# Patient Record
Sex: Male | Born: 1965 | Race: Black or African American | Hispanic: No | Smoking: Current every day smoker
Health system: Southern US, Community
[De-identification: ages and names within clinical notes are randomized; demographics above are authoritative.]

## PROBLEM LIST (undated history)

## (undated) ENCOUNTER — Emergency Department (HOSPITAL_COMMUNITY): Admission: EM | Payer: Self-pay | Source: Home / Self Care

## (undated) DIAGNOSIS — F4310 Post-traumatic stress disorder, unspecified: Secondary | ICD-10-CM

## (undated) DIAGNOSIS — I10 Essential (primary) hypertension: Secondary | ICD-10-CM

## (undated) DIAGNOSIS — K649 Unspecified hemorrhoids: Secondary | ICD-10-CM

## (undated) DIAGNOSIS — A64 Unspecified sexually transmitted disease: Secondary | ICD-10-CM

## (undated) DIAGNOSIS — K219 Gastro-esophageal reflux disease without esophagitis: Secondary | ICD-10-CM

---

## 2000-09-15 ENCOUNTER — Emergency Department (HOSPITAL_COMMUNITY): Admission: EM | Admit: 2000-09-15 | Discharge: 2000-09-15 | Payer: Self-pay | Admitting: Emergency Medicine

## 2000-11-17 ENCOUNTER — Emergency Department (HOSPITAL_COMMUNITY): Admission: EM | Admit: 2000-11-17 | Discharge: 2000-11-17 | Payer: Self-pay | Admitting: Emergency Medicine

## 2000-12-23 ENCOUNTER — Encounter: Payer: Self-pay | Admitting: Emergency Medicine

## 2000-12-23 ENCOUNTER — Emergency Department (HOSPITAL_COMMUNITY): Admission: EM | Admit: 2000-12-23 | Discharge: 2000-12-23 | Payer: Self-pay | Admitting: Emergency Medicine

## 2002-03-02 ENCOUNTER — Emergency Department (HOSPITAL_COMMUNITY): Admission: EM | Admit: 2002-03-02 | Discharge: 2002-03-02 | Payer: Self-pay | Admitting: *Deleted

## 2002-08-06 ENCOUNTER — Emergency Department (HOSPITAL_COMMUNITY): Admission: EM | Admit: 2002-08-06 | Discharge: 2002-08-06 | Payer: Self-pay | Admitting: Emergency Medicine

## 2003-07-01 ENCOUNTER — Emergency Department (HOSPITAL_COMMUNITY): Admission: EM | Admit: 2003-07-01 | Discharge: 2003-07-01 | Payer: Self-pay | Admitting: Emergency Medicine

## 2003-09-23 ENCOUNTER — Emergency Department (HOSPITAL_COMMUNITY): Admission: EM | Admit: 2003-09-23 | Discharge: 2003-09-23 | Payer: Self-pay | Admitting: Emergency Medicine

## 2003-10-01 ENCOUNTER — Emergency Department (HOSPITAL_COMMUNITY): Admission: EM | Admit: 2003-10-01 | Discharge: 2003-10-01 | Payer: Self-pay | Admitting: Emergency Medicine

## 2003-11-21 ENCOUNTER — Emergency Department (HOSPITAL_COMMUNITY): Admission: EM | Admit: 2003-11-21 | Discharge: 2003-11-21 | Payer: Self-pay | Admitting: Emergency Medicine

## 2004-01-12 ENCOUNTER — Emergency Department (HOSPITAL_COMMUNITY): Admission: EM | Admit: 2004-01-12 | Discharge: 2004-01-12 | Payer: Self-pay | Admitting: Emergency Medicine

## 2004-06-03 ENCOUNTER — Emergency Department (HOSPITAL_COMMUNITY): Admission: EM | Admit: 2004-06-03 | Discharge: 2004-06-03 | Payer: Self-pay | Admitting: Emergency Medicine

## 2004-06-14 ENCOUNTER — Emergency Department (HOSPITAL_COMMUNITY): Admission: EM | Admit: 2004-06-14 | Discharge: 2004-06-14 | Payer: Self-pay | Admitting: Emergency Medicine

## 2006-05-02 ENCOUNTER — Emergency Department (HOSPITAL_COMMUNITY): Admission: EM | Admit: 2006-05-02 | Discharge: 2006-05-02 | Payer: Self-pay | Admitting: Emergency Medicine

## 2006-05-16 ENCOUNTER — Emergency Department (HOSPITAL_COMMUNITY): Admission: EM | Admit: 2006-05-16 | Discharge: 2006-05-16 | Payer: Self-pay | Admitting: Emergency Medicine

## 2006-05-31 ENCOUNTER — Emergency Department (HOSPITAL_COMMUNITY): Admission: EM | Admit: 2006-05-31 | Discharge: 2006-05-31 | Payer: Self-pay | Admitting: Emergency Medicine

## 2006-06-28 ENCOUNTER — Emergency Department (HOSPITAL_COMMUNITY): Admission: EM | Admit: 2006-06-28 | Discharge: 2006-06-28 | Payer: Self-pay | Admitting: Emergency Medicine

## 2006-07-12 ENCOUNTER — Emergency Department (HOSPITAL_COMMUNITY): Admission: EM | Admit: 2006-07-12 | Discharge: 2006-07-12 | Payer: Self-pay | Admitting: Emergency Medicine

## 2006-11-12 ENCOUNTER — Emergency Department (HOSPITAL_COMMUNITY): Admission: EM | Admit: 2006-11-12 | Discharge: 2006-11-12 | Payer: Self-pay | Admitting: Emergency Medicine

## 2006-11-26 ENCOUNTER — Emergency Department (HOSPITAL_COMMUNITY): Admission: EM | Admit: 2006-11-26 | Discharge: 2006-11-26 | Payer: Self-pay | Admitting: Emergency Medicine

## 2007-02-26 ENCOUNTER — Emergency Department (HOSPITAL_COMMUNITY): Admission: EM | Admit: 2007-02-26 | Discharge: 2007-02-26 | Payer: Self-pay | Admitting: Emergency Medicine

## 2007-04-11 ENCOUNTER — Emergency Department (HOSPITAL_COMMUNITY): Admission: EM | Admit: 2007-04-11 | Discharge: 2007-04-11 | Payer: Self-pay | Admitting: Emergency Medicine

## 2008-03-25 ENCOUNTER — Emergency Department (HOSPITAL_COMMUNITY): Admission: EM | Admit: 2008-03-25 | Discharge: 2008-03-26 | Payer: Self-pay | Admitting: Emergency Medicine

## 2009-03-22 ENCOUNTER — Emergency Department (HOSPITAL_COMMUNITY): Admission: EM | Admit: 2009-03-22 | Discharge: 2009-03-22 | Payer: Self-pay | Admitting: Emergency Medicine

## 2009-05-29 ENCOUNTER — Emergency Department (HOSPITAL_COMMUNITY): Admission: EM | Admit: 2009-05-29 | Discharge: 2009-05-29 | Payer: Self-pay | Admitting: Emergency Medicine

## 2009-07-19 ENCOUNTER — Emergency Department (HOSPITAL_COMMUNITY): Admission: EM | Admit: 2009-07-19 | Discharge: 2009-07-19 | Payer: Self-pay | Admitting: Emergency Medicine

## 2009-12-12 ENCOUNTER — Emergency Department (HOSPITAL_COMMUNITY): Admission: EM | Admit: 2009-12-12 | Discharge: 2009-12-12 | Payer: Self-pay | Admitting: Emergency Medicine

## 2010-03-06 ENCOUNTER — Emergency Department (HOSPITAL_COMMUNITY): Admission: EM | Admit: 2010-03-06 | Discharge: 2010-03-06 | Payer: Self-pay | Admitting: Emergency Medicine

## 2010-04-12 ENCOUNTER — Emergency Department (HOSPITAL_COMMUNITY): Admission: EM | Admit: 2010-04-12 | Discharge: 2010-04-12 | Payer: Self-pay | Admitting: Emergency Medicine

## 2010-07-17 ENCOUNTER — Emergency Department (HOSPITAL_COMMUNITY)
Admission: EM | Admit: 2010-07-17 | Discharge: 2010-07-17 | Disposition: A | Discharge status: Home / Self Care | Payer: Self-pay | Attending: Emergency Medicine | Admitting: Emergency Medicine

## 2010-07-17 DIAGNOSIS — K219 Gastro-esophageal reflux disease without esophagitis: Secondary | ICD-10-CM | POA: Insufficient documentation

## 2010-07-17 DIAGNOSIS — R51 Headache: Secondary | ICD-10-CM | POA: Insufficient documentation

## 2010-07-17 DIAGNOSIS — J3489 Other specified disorders of nose and nasal sinuses: Secondary | ICD-10-CM | POA: Insufficient documentation

## 2010-07-17 DIAGNOSIS — J329 Chronic sinusitis, unspecified: Secondary | ICD-10-CM | POA: Insufficient documentation

## 2010-08-11 ENCOUNTER — Emergency Department (HOSPITAL_COMMUNITY)
Admission: EM | Admit: 2010-08-11 | Discharge: 2010-08-11 | Disposition: A | Discharge status: Home / Self Care | Payer: Self-pay | Attending: Emergency Medicine | Admitting: Emergency Medicine

## 2010-08-11 DIAGNOSIS — J019 Acute sinusitis, unspecified: Secondary | ICD-10-CM | POA: Insufficient documentation

## 2010-09-02 LAB — RAPID STREP SCREEN (MED CTR MEBANE ONLY): Streptococcus, Group A Screen (Direct): POSITIVE — AB

## 2011-02-07 ENCOUNTER — Emergency Department (HOSPITAL_COMMUNITY)
Admission: EM | Admit: 2011-02-07 | Discharge: 2011-02-07 | Disposition: A | Discharge status: Home / Self Care | Payer: Self-pay | Attending: Emergency Medicine | Admitting: Emergency Medicine

## 2011-02-07 ENCOUNTER — Encounter: Payer: Self-pay | Admitting: *Deleted

## 2011-02-07 DIAGNOSIS — N342 Other urethritis: Secondary | ICD-10-CM

## 2011-02-07 DIAGNOSIS — N419 Inflammatory disease of prostate, unspecified: Secondary | ICD-10-CM | POA: Insufficient documentation

## 2011-02-07 LAB — URINALYSIS, ROUTINE W REFLEX MICROSCOPIC
Bilirubin Urine: NEGATIVE
Glucose, UA: NEGATIVE mg/dL
Hgb urine dipstick: NEGATIVE
Ketones, ur: NEGATIVE mg/dL
Nitrite: NEGATIVE

## 2011-02-07 MED ORDER — CIPROFLOXACIN HCL 500 MG PO TABS: 500.0000 mg | ORAL_TABLET | Freq: Two times a day (BID) | ORAL | Status: AC

## 2011-02-07 MED ORDER — CEFTRIAXONE SODIUM 250 MG IJ SOLR
500.0000 mg | Freq: Once | INTRAMUSCULAR | Status: AC
  Administered 2011-02-07: 500 mg via INTRAMUSCULAR
  Filled 2011-02-07: qty 500

## 2011-02-07 MED ORDER — AZITHROMYCIN 250 MG PO TABS
1000.0000 mg | ORAL_TABLET | Freq: Once | ORAL | Status: AC
  Administered 2011-02-07: 1000 mg via ORAL
  Filled 2011-02-07: qty 4

## 2011-02-07 NOTE — ED Provider Notes (Signed)
History     CSN: [id number] Arrival date & time: 02/07/2011  3:26 PM  Chief Complaint  Patient presents with  . painful urination    HPI Comments: patient c/o pain and burning with urination for one week.  States that he has also noticed a clear to milky discharge from his penis intermittently.  He also c/o some low back pain as well.  He denies recent, unprotected sex.  He also denies fever, vomiting or abd pain  Patient is a 45 y.o. male presenting with dysuria. The history is provided by the patient.  Dysuria  This is a new problem. The current episode started more than 2 days ago. The problem occurs every urination. The problem has not changed since onset.The quality of the pain is described as burning. The pain is mild. There has been no fever. He is sexually active. There is no history of pyelonephritis. Associated symptoms include discharge. Pertinent negatives include no chills, no sweats, no nausea, no vomiting, no frequency, no hematuria, no urgency and no flank pain. He has tried nothing for the symptoms. His past medical history does not include kidney stones, urological procedure, recurrent UTIs or catheterization.    History reviewed. No pertinent past medical history.  History reviewed. No pertinent past surgical history.  No family history on file.  History  Substance Use Topics  . Smoking status: Never Smoker   . Smokeless tobacco: Not on file  . Alcohol Use: No      Review of Systems  Constitutional: Negative for fever, chills, activity change and appetite change.  Gastrointestinal: Negative for nausea, vomiting, abdominal pain, constipation and blood in stool.  Genitourinary: Positive for dysuria and discharge. Negative for urgency, frequency, hematuria, flank pain, decreased urine volume, penile swelling, scrotal swelling, genital sores and testicular pain.  Musculoskeletal: Positive for back pain. Negative for myalgias, joint swelling, arthralgias and gait  problem.  Skin: Negative.   Hematological: Negative for adenopathy. Does not bruise/bleed easily.  All other systems reviewed and are negative.    Physical Exam  BP 163/100  Pulse 81  Temp(Src) 97.8 F (36.6 C) (Oral)  Resp 16  Ht 5\' 7"  (1.702 m)  Wt 215 lb (97.523 kg)  BMI 33.67 kg/m2  SpO2 100%  Physical Exam  Nursing note and vitals reviewed. Constitutional: He appears well-developed and well-nourished. No distress.  HENT:  Head: Normocephalic and atraumatic.  Cardiovascular: Normal rate, regular rhythm and normal heart sounds.   Abdominal: Soft. He exhibits no distension and no mass. There is no tenderness. There is no rebound and no guarding.  Genitourinary: Testes normal. Rectal exam shows no external hemorrhoid, no mass and anal tone normal. Prostate is tender. Circumcised. No paraphimosis, penile erythema or penile tenderness. Discharge found.       Mildly tender, boggy prostate  Musculoskeletal: Normal range of motion. He exhibits no tenderness.  Neurological: No cranial nerve deficit. He exhibits normal muscle tone.  Skin: Skin is warm and dry.  Psychiatric: He has a normal mood and affect.    ED Course  Procedures  MDM   GC and Chlamydia culture pending.  Pt has a slightly tender, boggy prostate on exam.  I have treated him with rocephin and zithromycin and I will also start him on cipro to cover non-gonococcal prostatitis given his age.  Medical screening examination/treatment/procedure(s) were performed by non-physician practitioner and as supervising physician I was immediately available for consultation/collaboration. Osvaldo Human, M.D.    Tammy L. Triplett, [place] 02/08/11 1747  Carleene Cooper III, MD 02/09/11 1131

## 2011-02-07 NOTE — ED Notes (Signed)
Painful urination x 1 wk.  Denies n/v/d.  C/o lower abd pain and lower back pain.

## 2011-02-09 LAB — GC/CHLAMYDIA PROBE AMP, GENITAL
Chlamydia, DNA Probe: POSITIVE — AB
GC Probe Amp, Genital: NEGATIVE

## 2011-02-14 NOTE — ED Notes (Signed)
Pt returned call wanting lab results. Advised of results. Treated per protocol. Advised to notify partner(s)

## 2011-03-08 LAB — URINALYSIS, ROUTINE W REFLEX MICROSCOPIC
Bilirubin Urine: NEGATIVE
Protein, ur: NEGATIVE

## 2011-03-08 LAB — GC/CHLAMYDIA PROBE AMP, GENITAL: Chlamydia, DNA Probe: NEGATIVE

## 2011-03-08 LAB — URINE CULTURE: Culture: NO GROWTH

## 2011-03-10 LAB — GC/CHLAMYDIA PROBE AMP, GENITAL
Chlamydia, DNA Probe: NEGATIVE
GC Probe Amp, Genital: POSITIVE — AB

## 2011-03-16 LAB — URINALYSIS, ROUTINE W REFLEX MICROSCOPIC
Bilirubin Urine: NEGATIVE
Glucose, UA: NEGATIVE
Hgb urine dipstick: NEGATIVE
Ketones, ur: NEGATIVE
Ketones, ur: NEGATIVE
Protein, ur: NEGATIVE
Protein, ur: NEGATIVE
Urobilinogen, UA: 0.2
Urobilinogen, UA: 1
pH: 6

## 2011-03-16 LAB — GC/CHLAMYDIA PROBE AMP, GENITAL
Chlamydia, DNA Probe: NEGATIVE
GC Probe Amp, Genital: NEGATIVE

## 2011-04-18 ENCOUNTER — Encounter (HOSPITAL_COMMUNITY): Payer: Self-pay

## 2011-04-18 ENCOUNTER — Emergency Department (HOSPITAL_COMMUNITY)
Admission: EM | Admit: 2011-04-18 | Discharge: 2011-04-18 | Disposition: A | Discharge status: Home / Self Care | Payer: Self-pay | Attending: Emergency Medicine | Admitting: Emergency Medicine

## 2011-04-18 DIAGNOSIS — J029 Acute pharyngitis, unspecified: Secondary | ICD-10-CM | POA: Insufficient documentation

## 2011-04-18 DIAGNOSIS — J019 Acute sinusitis, unspecified: Secondary | ICD-10-CM | POA: Insufficient documentation

## 2011-04-18 LAB — RAPID STREP SCREEN (MED CTR MEBANE ONLY): Streptococcus, Group A Screen (Direct): NEGATIVE

## 2011-04-18 MED ORDER — AMOXICILLIN-POT CLAVULANATE 875-125 MG PO TABS
1.0000 | ORAL_TABLET | Freq: Once | ORAL | Status: AC
  Administered 2011-04-18: 1 via ORAL
  Filled 2011-04-18: qty 1

## 2011-04-18 MED ORDER — TRIAMCINOLONE ACETONIDE(NASAL) 55 MCG/ACT NA INHA: 2.0000 | Freq: Every day | NASAL | Status: DC

## 2011-04-18 MED ORDER — AMOXICILLIN-POT CLAVULANATE 875-125 MG PO TABS: 1.0000 | ORAL_TABLET | Freq: Two times a day (BID) | ORAL | Status: AC

## 2011-04-18 NOTE — ED Notes (Signed)
Pt presents with sore throat x 1 week. Pt states "i think I have strep throat".

## 2011-04-18 NOTE — ED Notes (Signed)
C/o sore throat x a week per pt

## 2011-04-18 NOTE — ED Provider Notes (Signed)
History     CSN: [id number] Arrival date & time: 04/18/2011  9:05 PM   First MD Initiated Contact with Patient 04/18/11 2118      Chief Complaint  Patient presents with  . Sore Throat    (Consider location/radiation/quality/duration/timing/severity/associated sxs/prior treatment) Patient is a 45 y.o. male presenting with pharyngitis. The history is provided by the patient. No language interpreter was used.  Sore Throat This is a new problem. Episode onset: 2 days ago. The problem occurs constantly. The problem has been unchanged. Associated symptoms include congestion and a sore throat. Associated symptoms comments: Sinus pain. The symptoms are aggravated by bending. He has tried nothing for the symptoms.    History reviewed. No pertinent past medical history.  History reviewed. No pertinent past surgical history.  No family history on file.  History  Substance Use Topics  . Smoking status: Never Smoker   . Smokeless tobacco: Not on file  . Alcohol Use: No      Review of Systems  HENT: Positive for congestion, sore throat and sinus pressure.   All other systems reviewed and are negative.    Allergies  Bee venom  Home Medications   Current Outpatient Rx  Name Route Sig Dispense Refill  . ONE-DAILY MULTI VITAMINS PO TABS Oral Take 1 tablet by mouth daily.      Marland Kitchen PHENYLEPHRINE-DM-GG 2.5-5-100 MG/5ML PO LIQD Oral Take 2 tablets by mouth as needed. For congestion       BP 151/91  Pulse 99  Temp(Src) 97.3 F (36.3 C) (Oral)  Resp 20  Ht 5\' 7"  (1.702 m)  Wt 205 lb (92.987 kg)  BMI 32.11 kg/m2  SpO2 97%  Physical Exam  Nursing note and vitals reviewed. Constitutional: He is oriented to person, place, and time. He appears well-developed and well-nourished. No distress.  HENT:  Head: Normocephalic and atraumatic.    Mouth/Throat: Mucous membranes are normal. No uvula swelling. No oropharyngeal exudate, posterior oropharyngeal edema, posterior oropharyngeal  erythema or tonsillar abscesses.    Eyes: EOM are normal.  Neck: Normal range of motion.  Cardiovascular: Normal rate, regular rhythm and intact distal pulses.   Pulmonary/Chest: Effort normal and breath sounds normal. No respiratory distress.  Abdominal: Soft. He exhibits no distension. There is no tenderness.  Musculoskeletal: Normal range of motion.  Neurological: He is alert and oriented to person, place, and time.  Skin: Skin is warm and dry. He is not diaphoretic.  Psychiatric: He has a normal mood and affect. Judgment normal.    ED Course  Procedures (including critical care time)   Labs Reviewed  RAPID STREP SCREEN   No results found.   No diagnosis found.    MDM          Worthy Rancher, PA 04/18/11 2223

## 2011-04-18 NOTE — ED Provider Notes (Signed)
Medical screening examination/treatment/procedure(s) were performed by non-physician practitioner and as supervising physician I was immediately available for consultation/collaboration.   Geoffery Lyons, MD 04/18/11 2252

## 2011-05-30 ENCOUNTER — Emergency Department (HOSPITAL_COMMUNITY)
Admission: EM | Admit: 2011-05-30 | Discharge: 2011-05-30 | Disposition: A | Discharge status: Home / Self Care | Payer: Self-pay | Attending: Emergency Medicine | Admitting: Emergency Medicine

## 2011-05-30 ENCOUNTER — Emergency Department (HOSPITAL_COMMUNITY): Payer: Self-pay

## 2011-05-30 ENCOUNTER — Encounter (HOSPITAL_COMMUNITY): Payer: Self-pay

## 2011-05-30 DIAGNOSIS — IMO0001 Reserved for inherently not codable concepts without codable children: Secondary | ICD-10-CM | POA: Insufficient documentation

## 2011-05-30 DIAGNOSIS — R059 Cough, unspecified: Secondary | ICD-10-CM | POA: Insufficient documentation

## 2011-05-30 DIAGNOSIS — B9789 Other viral agents as the cause of diseases classified elsewhere: Secondary | ICD-10-CM | POA: Insufficient documentation

## 2011-05-30 DIAGNOSIS — R05 Cough: Secondary | ICD-10-CM | POA: Insufficient documentation

## 2011-05-30 DIAGNOSIS — J3489 Other specified disorders of nose and nasal sinuses: Secondary | ICD-10-CM | POA: Insufficient documentation

## 2011-05-30 DIAGNOSIS — R509 Fever, unspecified: Secondary | ICD-10-CM | POA: Insufficient documentation

## 2011-05-30 DIAGNOSIS — B349 Viral infection, unspecified: Secondary | ICD-10-CM

## 2011-05-30 MED ORDER — HYDROCOD POLST-CHLORPHEN POLST 10-8 MG/5ML PO LQCR
5.0000 mL | Freq: Once | ORAL | Status: AC
  Administered 2011-05-30: 5 mL via ORAL
  Filled 2011-05-30: qty 5

## 2011-05-30 MED ORDER — HYDROCOD POLST-CHLORPHEN POLST 10-8 MG/5ML PO LQCR: 5.0000 mL | Freq: Two times a day (BID) | ORAL | Status: DC | PRN

## 2011-05-30 NOTE — ED Provider Notes (Signed)
Scribed for Nicholes Stairs, MD, the patient was seen in room [facility] . This chart was scribed by Ellie Lunch.   CSN: [id number]  Arrival date & time 05/30/11  0916   First MD Initiated Contact with Patient 05/30/11 [phone]      Chief Complaint  Patient presents with  . URI    (Consider location/radiation/quality/duration/timing/severity/associated sxs/prior treatment) HPI Philip Gonzalez is a 45 y.o. male who presents to the Emergency Department complaining of 1 week of sudden onset productive cough with assocaited congestion, myalgias, and fever. Pt has treated with mucinex with mild improvement. No h/o diabetes, high blood pressure, or DM. Pt is a non smoker.   History reviewed. No pertinent past medical history.  History reviewed. No pertinent past surgical history.  No family history on file.  History  Substance Use Topics  . Smoking status: Never Smoker   . Smokeless tobacco: Not on file  . Alcohol Use: No      Review of Systems 10 Systems reviewed and are negative for acute change except as noted in the HPI.   Allergies  Bee venom  Home Medications   Current Outpatient Rx  Name Route Sig Dispense Refill  . ONE-DAILY MULTI VITAMINS PO TABS Oral Take 1 tablet by mouth daily.      Marland Kitchen PHENYLEPHRINE-DM-GG 2.5-5-100 MG/5ML PO LIQD Oral Take 2 tablets by mouth as needed. For congestion     . TRIAMCINOLONE ACETONIDE 55 MCG/ACT NA INHA Nasal Place 2 sprays into the nose daily. 1 Inhaler 12    BP 150/93  Pulse 96  Temp(Src) 98.2 F (36.8 C) (Oral)  Resp 20  Ht 5\' 7"  (1.702 m)  Wt 205 lb (92.987 kg)  BMI 32.11 kg/m2  SpO2 97%  Physical Exam  Nursing note and vitals reviewed. Constitutional: He is oriented to person, place, and time. He appears well-developed and well-nourished. No distress.  HENT:  Head: Normocephalic and atraumatic.  Mouth/Throat: Oropharynx is clear and moist.       Frontal and sinus tenderness  Cardiovascular: Normal rate,  regular rhythm and normal heart sounds.   Pulmonary/Chest: Effort normal and breath sounds normal. No respiratory distress.  Neurological: He is alert and oriented to person, place, and time.  Skin: Skin is warm and dry.  Psychiatric: He has a normal mood and affect.    ED Course  Procedures (including critical care time)  Dg Chest 2 View  05/30/2011  *RADIOLOGY REPORT*  Clinical Data: Productive cough  CHEST - 2 VIEW  Comparison: 05/29/2009  Findings: Heart size is normal.  Mediastinal shadows are normal. Lungs are clear.  No effusions.  No bony abnormalities.  There is prominent epicardial fat anteriorly on the lateral view, not pathologic.  IMPRESSION: Normal chest  Original Report Authenticated By: Thomasenia Sales, M.D.   ED MEDICATION Medications  chlorpheniramine-HYDROcodone (TUSSIONEX) 10-8 MG/5ML suspension 5 mL (5 mL Oral Given 05/30/11 1042)     No diagnosis found.    MDM  Viral infection No pneumonia No hypoxia or respiratory distress or toxicity  I personally performed the services described in this documentation, which was scribed in my presence. The recorded information has been reviewed and considered.        Nicholes Stairs, MD 05/30/11 1109

## 2011-05-30 NOTE — ED Notes (Signed)
Pt c/o sinus pressure, productive cough with yellow sputum, generalized body aches, and fever x 1 week.  Pt reports burning sensation in lower back and thighs.  Has been taking generic mucinex at home.

## 2012-01-10 ENCOUNTER — Encounter (HOSPITAL_COMMUNITY): Payer: Self-pay | Admitting: Emergency Medicine

## 2012-01-10 ENCOUNTER — Emergency Department (HOSPITAL_COMMUNITY)
Admission: EM | Admit: 2012-01-10 | Discharge: 2012-01-10 | Disposition: A | Discharge status: Home / Self Care | Payer: Self-pay | Attending: Emergency Medicine | Admitting: Emergency Medicine

## 2012-01-10 DIAGNOSIS — L259 Unspecified contact dermatitis, unspecified cause: Secondary | ICD-10-CM | POA: Insufficient documentation

## 2012-01-10 DIAGNOSIS — K219 Gastro-esophageal reflux disease without esophagitis: Secondary | ICD-10-CM | POA: Insufficient documentation

## 2012-01-10 DIAGNOSIS — F17200 Nicotine dependence, unspecified, uncomplicated: Secondary | ICD-10-CM | POA: Insufficient documentation

## 2012-01-10 HISTORY — DX: Gastro-esophageal reflux disease without esophagitis: K21.9

## 2012-01-10 MED ORDER — HYDROXYZINE HCL 25 MG PO TABS
50.0000 mg | ORAL_TABLET | Freq: Once | ORAL | Status: AC
  Administered 2012-01-10: 50 mg via ORAL
  Filled 2012-01-10: qty 2

## 2012-01-10 MED ORDER — PREDNISONE 10 MG PO TABS: ORAL_TABLET | ORAL | Status: DC

## 2012-01-10 MED ORDER — HYDROXYZINE HCL 25 MG PO TABS: 25.0000 mg | ORAL_TABLET | Freq: Four times a day (QID) | ORAL | Status: AC | PRN

## 2012-01-10 NOTE — ED Notes (Signed)
Patient states "I have poison oak for 1 week. Usually I can treat it at home but it is not helping this time." Complaining of generalized itching and rash "all over."

## 2012-01-10 NOTE — ED Provider Notes (Signed)
History     CSN: [id number]  Arrival date & time 01/10/12  2042   First MD Initiated Contact with Patient 01/10/12 2211      Chief Complaint  Patient presents with  . Rash    HPI Pt was seen at 2225.  Per pt, c/o gradual onset and persistence of constant "poison oak" for the past 1 week.  Describes his rash as an "itchy rash all over." Pt states he has been using OTC anti-itch cream without relief.  Denies SOB/wheezing, no open wounds, no drainage, no fevers.    Past Medical History  Diagnosis Date  . Acid reflux     History reviewed. No pertinent past surgical history.   History  Substance Use Topics  . Smoking status: Current Everyday Smoker -- 1.0 packs/day  . Smokeless tobacco: Not on file  . Alcohol Use: Yes     occ      Review of Systems ROS: Statement: All systems negative except as marked or noted in the HPI; Constitutional: Negative for fever and chills. ; ; Eyes: Negative for eye pain, redness and discharge. ; ; ENMT: Negative for ear pain, hoarseness, nasal congestion, sinus pressure and sore throat. ; ; Cardiovascular: Negative for chest pain, palpitations, diaphoresis, dyspnea and peripheral edema. ; ; Respiratory: Negative for cough, wheezing and stridor. ; ; Gastrointestinal: Negative for nausea, vomiting, diarrhea, abdominal pain, blood in stool, hematemesis, jaundice and rectal bleeding. . ; ; Genitourinary: Negative for dysuria, flank pain and hematuria. ; ; Musculoskeletal: Negative for back pain and neck pain. Negative for swelling and trauma.; ; Skin: +rash, pruritis. Negative for abrasions, blisters, bruising and skin lesion.; ; Neuro: Negative for headache, lightheadedness and neck stiffness. Negative for weakness, altered level of consciousness , altered mental status, extremity weakness, paresthesias, involuntary movement, seizure and syncope.       Allergies  Bee venom and Poison oak extract  Home Medications   Current Outpatient Rx  Name Route  Sig Dispense Refill  . ONE-DAILY MULTI VITAMINS PO TABS Oral Take 1 tablet by mouth daily.      Marland Kitchen OMEPRAZOLE 20 MG PO CPDR Oral Take 20 mg by mouth daily.    . TRIAMCINOLONE ACETONIDE 55 MCG/ACT NA INHA Nasal Place 2 sprays into the nose daily. 1 Inhaler 12    BP 139/101  Pulse 87  Temp 97.7 F (36.5 C) (Oral)  Resp 16  Ht 5\' 8"  (1.727 m)  Wt 205 lb (92.987 kg)  BMI 31.17 kg/m2  SpO2 95%  Physical Exam 2230: Physical examination:  Nursing notes reviewed; Vital signs and O2 SAT reviewed;  Constitutional: Well developed, Well nourished, Well hydrated, In no acute distress; Head:  Normocephalic, atraumatic; Eyes: EOMI, PERRL, No scleral icterus; ENMT: Mouth and pharynx normal, Mucous membranes moist; Neck: Supple, Full range of motion, No lymphadenopathy; Cardiovascular: Regular rate and rhythm, No murmur, rub, or gallop; Respiratory: Breath sounds clear & equal bilaterally, No rales, rhonchi, wheezes.  Speaking full sentences with ease, Normal respiratory effort/excursion; Chest: Nontender, Movement normal;; Extremities: Pulses normal, No tenderness, No edema, No calf edema or asymmetry.; Neuro: AA&Ox3, Major CN grossly intact.  Speech clear. No gross focal motor or sensory deficits in extremities.; Skin: Color normal, Warm, Dry, +diffuse maculopapular rash with excoriations pt is scratching at during exam.    ED Course  Procedures   MDM  MDM Reviewed: nursing note and vitals      2240:  Will tx for contact dermatitis.  Dx d/w pt.  Questions  answered.  Verb understanding, agreeable to d/c home with outpt f/u.       Laray Anger, DO 01/11/12 Margretta Ditty

## 2012-01-28 ENCOUNTER — Emergency Department (HOSPITAL_COMMUNITY)
Admission: EM | Admit: 2012-01-28 | Discharge: 2012-01-28 | Disposition: A | Discharge status: Home / Self Care | Payer: Self-pay | Attending: Emergency Medicine | Admitting: Emergency Medicine

## 2012-01-28 ENCOUNTER — Encounter (HOSPITAL_COMMUNITY): Payer: Self-pay | Admitting: *Deleted

## 2012-01-28 DIAGNOSIS — K219 Gastro-esophageal reflux disease without esophagitis: Secondary | ICD-10-CM | POA: Insufficient documentation

## 2012-01-28 DIAGNOSIS — Z91038 Other insect allergy status: Secondary | ICD-10-CM | POA: Insufficient documentation

## 2012-01-28 DIAGNOSIS — T622X1A Toxic effect of other ingested (parts of) plant(s), accidental (unintentional), initial encounter: Secondary | ICD-10-CM | POA: Insufficient documentation

## 2012-01-28 DIAGNOSIS — L255 Unspecified contact dermatitis due to plants, except food: Secondary | ICD-10-CM | POA: Insufficient documentation

## 2012-01-28 DIAGNOSIS — F17200 Nicotine dependence, unspecified, uncomplicated: Secondary | ICD-10-CM | POA: Insufficient documentation

## 2012-01-28 MED ORDER — DEXAMETHASONE SODIUM PHOSPHATE 4 MG/ML IJ SOLN: 10.0000 mg | Freq: Once | INTRAMUSCULAR | Status: DC

## 2012-01-28 MED ORDER — DEXAMETHASONE SODIUM PHOSPHATE 10 MG/ML IJ SOLN
INTRAMUSCULAR | Status: AC
  Administered 2012-01-28: 10 mg via INTRAMUSCULAR
  Filled 2012-01-28: qty 1

## 2012-01-28 MED ORDER — FAMOTIDINE 20 MG PO TABS
20.0000 mg | ORAL_TABLET | Freq: Once | ORAL | Status: AC
  Administered 2012-01-28: 20 mg via ORAL
  Filled 2012-01-28: qty 1

## 2012-01-28 MED ORDER — DIPHENHYDRAMINE HCL 25 MG PO CAPS
50.0000 mg | ORAL_CAPSULE | Freq: Once | ORAL | Status: AC
  Administered 2012-01-28: 50 mg via ORAL
  Filled 2012-01-28: qty 2

## 2012-01-28 NOTE — ED Notes (Signed)
Pt treated for poison oak 3 weeks ago. Pt states that it has returned.

## 2012-01-28 NOTE — ED Provider Notes (Signed)
History     CSN: [id number]  Arrival date & time 01/28/12  1507   First MD Initiated Contact with Patient 01/28/12 1611      Chief Complaint  Patient presents with  . Poison Oak    (Consider location/radiation/quality/duration/timing/severity/associated sxs/prior treatment) HPI Comments: Pt states he got into poison oak last week while doing yard work.  He was here last week and given rx for prednisone x 5 days.  States he still has a few lesions around his rectum that are irritating to him.  The history is provided by the patient. No language interpreter was used.    Past Medical History  Diagnosis Date  . Acid reflux     History reviewed. No pertinent past surgical history.  No family history on file.  History  Substance Use Topics  . Smoking status: Current Everyday Smoker -- 1.0 packs/day  . Smokeless tobacco: Not on file  . Alcohol Use: Yes     occ      Review of Systems  Constitutional: Negative for fever and chills.  Skin: Positive for rash.  All other systems reviewed and are negative.    Allergies  Bee venom and Poison oak extract  Home Medications   Current Outpatient Rx  Name Route Sig Dispense Refill  . ONE-DAILY MULTI VITAMINS PO TABS Oral Take 1 tablet by mouth daily.      Marland Kitchen OMEPRAZOLE 20 MG PO CPDR Oral Take 20 mg by mouth daily.    Marland Kitchen PREDNISONE 10 MG PO TABS  Take 5 tablets PO x2 days, then take 4 tabs PO x3 days, then 3 tabs PO x3 days, then 2 tabs PO x3 days, then 1 tab PO x3 days 40 tablet 0  . TRIAMCINOLONE ACETONIDE 55 MCG/ACT NA INHA Nasal Place 2 sprays into the nose daily. 1 Inhaler 12    BP 137/89  Pulse 87  Temp 97.8 F (36.6 C) (Oral)  Resp 16  SpO2 97%  Physical Exam  Nursing note and vitals reviewed. Constitutional: He is oriented to person, place, and time. He appears well-developed and well-nourished.  HENT:  Head: Normocephalic and atraumatic.  Eyes: EOM are normal.  Neck: Normal range of motion.  Cardiovascular:  Normal rate, regular rhythm, normal heart sounds and intact distal pulses.   Pulmonary/Chest: Effort normal and breath sounds normal. No respiratory distress.  Abdominal: Soft. He exhibits no distension. There is no tenderness.  Musculoskeletal: Normal range of motion.  Neurological: He is alert and oriented to person, place, and time.  Skin: Skin is warm and dry. Rash noted. Rash is vesicular.       ~ 6 small blistery lesions noted to inner aspects of B buttocks c/w  Poison oak/ivy.  Psychiatric: He has a normal mood and affect. Judgment normal.    ED Course  Procedures (including critical care time)  Labs Reviewed - No data to display No results found.   1. Rhus dermatitis       MDM  IM decadron 10 mg Benadryl 50 mg QID pepcid 20 mg BID F/u with PCP        Evalina Field, PA 01/28/12 1624

## 2012-01-28 NOTE — ED Provider Notes (Signed)
Medical screening examination/treatment/procedure(s) were performed by non-physician practitioner and as supervising physician I was immediately available for consultation/collaboration.   Amberly Livas L Demisha Nokes, MD 01/28/12 1935 

## 2012-01-28 NOTE — ED Notes (Signed)
Pt states that he has poison oak rash to bottom and between buttocks, states he was seen here for same few weeks ago, has taken all of meds prescribed last time, denies taking any benadryl recently

## 2012-01-30 ENCOUNTER — Encounter (HOSPITAL_COMMUNITY): Payer: Self-pay | Admitting: *Deleted

## 2012-01-30 ENCOUNTER — Emergency Department (HOSPITAL_COMMUNITY)
Admission: EM | Admit: 2012-01-30 | Discharge: 2012-01-30 | Disposition: A | Discharge status: Home / Self Care | Payer: Self-pay | Attending: Emergency Medicine | Admitting: Emergency Medicine

## 2012-01-30 DIAGNOSIS — R21 Rash and other nonspecific skin eruption: Secondary | ICD-10-CM | POA: Insufficient documentation

## 2012-01-30 MED ORDER — HYDROXYZINE PAMOATE 25 MG PO CAPS: ORAL_CAPSULE | ORAL | Status: DC

## 2012-01-30 NOTE — ED Provider Notes (Signed)
History     CSN: [id number]  Arrival date & time 01/30/12  1200   First MD Initiated Contact with Patient 01/30/12 1401      Chief Complaint  Patient presents with  . Rash    (Consider location/radiation/quality/duration/timing/severity/associated sxs/prior treatment) HPI Comments: Patient states that he has been seen in the emergency department for what was believed to be exposure to poison ivy or poison oak. He has been on prednisone. The patient states that the lesions on his upper extremity had premature I.. He states however there is an area on the left lower extremity that remains red and itchy. The patient denies any known insect bite. He denies any known injury. He has not changed soaps or dryer sheets. He denies any new socks of being worn in this particular area. He presents now because he is having extreme itching, and he is concerned that he has been treated and continues to have the rash on his left leg only.  The history is provided by the patient.    Past Medical History  Diagnosis Date  . Acid reflux     History reviewed. No pertinent past surgical history.  History reviewed. No pertinent family history.  History  Substance Use Topics  . Smoking status: Current Everyday Smoker -- 1.0 packs/day  . Smokeless tobacco: Not on file  . Alcohol Use: Yes     occ      Review of Systems  Constitutional: Negative for activity change.       All ROS Neg except as noted in HPI  HENT: Negative for nosebleeds and neck pain.   Eyes: Negative for photophobia and discharge.  Respiratory: Negative for cough, shortness of breath and wheezing.   Cardiovascular: Negative for chest pain and palpitations.  Gastrointestinal: Negative for abdominal pain and blood in stool.  Genitourinary: Negative for dysuria, frequency and hematuria.  Musculoskeletal: Negative for back pain and arthralgias.  Skin: Positive for rash.       Itching  Neurological: Negative for dizziness, seizures  and speech difficulty.  Psychiatric/Behavioral: Negative for hallucinations and confusion.    Allergies  Bee venom and Poison oak extract  Home Medications   Current Outpatient Rx  Name Route Sig Dispense Refill  . ONE-DAILY MULTI VITAMINS PO TABS Oral Take 1 tablet by mouth daily.      Marland Kitchen OMEPRAZOLE 20 MG PO CPDR Oral Take 20 mg by mouth daily.      BP 141/98  Pulse 80  Temp 98.1 F (36.7 C) (Oral)  Resp 20  Ht 5\' 8"  (1.727 m)  Wt 205 lb (92.987 kg)  BMI 31.17 kg/m2  SpO2 100%  Physical Exam  Nursing note and vitals reviewed. Constitutional: He is oriented to person, place, and time. He appears well-developed and well-nourished.  Non-toxic appearance.  HENT:  Head: Normocephalic.  Right Ear: Tympanic membrane and external ear normal.  Left Ear: Tympanic membrane and external ear normal.  Eyes: EOM and lids are normal. Pupils are equal, round, and reactive to light.  Neck: Normal range of motion. Neck supple. Carotid bruit is not present.  Cardiovascular: Normal rate, regular rhythm, normal heart sounds, intact distal pulses and normal pulses.   Pulmonary/Chest: Breath sounds normal. No respiratory distress.  Abdominal: Soft. Bowel sounds are normal. There is no tenderness. There is no guarding.  Musculoskeletal: Normal range of motion.       There is a 1 x 1.2 cm area of increased redness at the mid anterior tibial area. This  rash seems to be under the skin, and does not blanch. No red streaks noted. Not hot. Distal pulses symetrical  Lymphadenopathy:       Head (right side): No submandibular adenopathy present.       Head (left side): No submandibular adenopathy present.    He has no cervical adenopathy.  Neurological: He is alert and oriented to person, place, and time. He has normal strength. No cranial nerve deficit or sensory deficit.  Skin: Skin is warm and dry.  Psychiatric: He has a normal mood and affect. His speech is normal.    ED Course  Procedures  (including critical care time)  Labs Reviewed - No data to display No results found.   No diagnosis found.    MDM  I have reviewed nursing notes, vital signs, and all appropriate lab and imaging results for this patient. Rx for vistaril given. Pt given name of dermatologist for additional evaluation if not improving.       Kathie Dike, [place] 02/10/12 [phone]

## 2012-01-30 NOTE — ED Notes (Addendum)
Rash "poison oak" for 1 month, has been seen here for same. Says it keeps coming back.  Scattered rash to extremities

## 2012-02-11 NOTE — ED Provider Notes (Signed)
Medical screening examination/treatment/procedure(s) were performed by non-physician practitioner and as supervising physician I was immediately available for consultation/collaboration.   Glynn Octave, MD 02/11/12 1043

## 2012-05-24 ENCOUNTER — Encounter (HOSPITAL_COMMUNITY): Payer: Self-pay

## 2012-05-24 ENCOUNTER — Emergency Department (HOSPITAL_COMMUNITY): Payer: Self-pay

## 2012-05-24 ENCOUNTER — Emergency Department (HOSPITAL_COMMUNITY)
Admission: EM | Admit: 2012-05-24 | Discharge: 2012-05-24 | Disposition: A | Discharge status: Home / Self Care | Payer: Self-pay | Attending: Emergency Medicine | Admitting: Emergency Medicine

## 2012-05-24 DIAGNOSIS — R509 Fever, unspecified: Secondary | ICD-10-CM | POA: Insufficient documentation

## 2012-05-24 DIAGNOSIS — J329 Chronic sinusitis, unspecified: Secondary | ICD-10-CM | POA: Insufficient documentation

## 2012-05-24 DIAGNOSIS — K219 Gastro-esophageal reflux disease without esophagitis: Secondary | ICD-10-CM | POA: Insufficient documentation

## 2012-05-24 DIAGNOSIS — J3489 Other specified disorders of nose and nasal sinuses: Secondary | ICD-10-CM | POA: Insufficient documentation

## 2012-05-24 DIAGNOSIS — Z79899 Other long term (current) drug therapy: Secondary | ICD-10-CM | POA: Insufficient documentation

## 2012-05-24 DIAGNOSIS — F17200 Nicotine dependence, unspecified, uncomplicated: Secondary | ICD-10-CM | POA: Insufficient documentation

## 2012-05-24 MED ORDER — AMOXICILLIN 500 MG PO CAPS: 500.0000 mg | ORAL_CAPSULE | Freq: Three times a day (TID) | ORAL | Status: DC

## 2012-05-24 MED ORDER — PSEUDOEPHEDRINE HCL 60 MG PO TABS: 60.0000 mg | ORAL_TABLET | Freq: Four times a day (QID) | ORAL | Status: DC | PRN

## 2012-05-24 NOTE — ED Notes (Signed)
Productive cough x 2 weeks per pt along with nasal congestion and fever.

## 2012-05-24 NOTE — ED Provider Notes (Signed)
History     CSN: [id number]  Arrival date & time 05/24/12  4403   First MD Initiated Contact with Patient 05/24/12 [id number]      Chief Complaint  Patient presents with  . Cough  . Fever  . Nasal Congestion    (Consider location/radiation/quality/duration/timing/severity/associated sxs/prior treatment) HPI Comments: Philip Gonzalez presents with a 2 week history of bilateral facial pressure,  Nasal congestion along with post nasal drip,  Cough with both nasal secretions and productive cough described as thick,  Yellow with black exudate.  He denies hemoptysis and has had no shortness of breath.  He describes intermittent fevers and has taken tylenol, and is also using an otc nasal decongestant spray which he states he uses "all the time" for chronic nasal congestion.  He has not seen his doctor for this complaint.  He denies chest pain and sob,  Also denies weakness,  Dizziness, ear pain and denies sore throat.  The history is provided by the patient.    Past Medical History  Diagnosis Date  . Acid reflux     History reviewed. No pertinent past surgical history.  No family history on file.  History  Substance Use Topics  . Smoking status: Current Every Day Smoker -- 1.0 packs/day  . Smokeless tobacco: Not on file  . Alcohol Use: Yes     Comment: occ      Review of Systems  Constitutional: Positive for fever and chills.  HENT: Positive for congestion, rhinorrhea and sinus pressure. Negative for ear pain, sore throat, facial swelling and neck pain.   Eyes: Negative.   Respiratory: Positive for cough. Negative for chest tightness, shortness of breath, wheezing and stridor.   Cardiovascular: Negative for chest pain.  Gastrointestinal: Negative for nausea and abdominal pain.  Genitourinary: Negative.   Musculoskeletal: Negative for joint swelling and arthralgias.  Skin: Negative.  Negative for rash and wound.  Neurological: Negative for dizziness, weakness, light-headedness,  numbness and headaches.  Hematological: Negative.   Psychiatric/Behavioral: Negative.     Allergies  Bee venom and Poison oak extract  Home Medications   Current Outpatient Rx  Name  Route  Sig  Dispense  Refill  . AMOXICILLIN 500 MG PO CAPS   Oral   Take 1 capsule (500 mg total) by mouth 3 (three) times daily.   30 capsule   0   . HYDROXYZINE PAMOATE 25 MG PO CAPS      1 at hs or q6h prn itching.   20 capsule   0   . ONE-DAILY MULTI VITAMINS PO TABS   Oral   Take 1 tablet by mouth daily.           Marland Kitchen OMEPRAZOLE 20 MG PO CPDR   Oral   Take 20 mg by mouth daily.           BP 173/95  Pulse 71  Temp 97.6 F (36.4 C) (Oral)  Resp 18  Ht 5\' 8"  (1.727 m)  Wt 205 lb (92.987 kg)  BMI 31.17 kg/m2  SpO2 99%  Physical Exam  Nursing note and vitals reviewed. Constitutional: He appears well-developed and well-nourished.  HENT:  Head: Normocephalic and atraumatic.  Right Ear: External ear normal.  Left Ear: External ear normal.  Nose: Mucosal edema present. Right sinus exhibits maxillary sinus tenderness. Left sinus exhibits maxillary sinus tenderness.  Eyes: Conjunctivae normal are normal.  Neck: Normal range of motion.  Cardiovascular: Normal rate, regular rhythm, normal heart sounds and intact distal pulses.  Pulmonary/Chest: Effort normal and breath sounds normal. No respiratory distress. He has no wheezes. He has no rales.  Abdominal: Soft. Bowel sounds are normal. There is no tenderness.  Musculoskeletal: Normal range of motion.  Neurological: He is alert.  Skin: Skin is warm and dry.  Psychiatric: He has a normal mood and affect.    ED Course  Procedures (including critical care time)  Labs Reviewed - No data to display Dg Chest 2 View  05/24/2012  *RADIOLOGY REPORT*  Clinical Data: Productive cough for 3 weeks.  CHEST - 2 VIEW  Comparison: PA and lateral chest 05/30/2011.  Findings: Lungs are clear.  Heart size is normal.  No pneumothorax or pleural  fluid.  IMPRESSION: Negative chest.   Original Report Authenticated By: Holley Dexter, M.D.      1. Sinusitis       MDM  Patients labs and/or radiological studies were reviewed during the medical decision making and disposition process. Pt with signs/sx suggestive sinusitis.  Prescribed amoxil, encouraged menthol drops, humidifier, nasal saline spray,  Wean off of nasal spray as this is probably causing rebound nasal congestion.  Pt also advised to have bp rechecked by pcp - will make appt for next week for recheck.         Burgess Amor, [place] 05/24/12 1142

## 2012-05-25 NOTE — ED Provider Notes (Signed)
Medical screening examination/treatment/procedure(s) were performed by non-physician practitioner and as supervising physician I was immediately available for consultation/collaboration.   Benny Lennert, MD 05/25/12 [phone]

## 2012-07-17 ENCOUNTER — Emergency Department (HOSPITAL_COMMUNITY)
Admission: EM | Admit: 2012-07-17 | Discharge: 2012-07-17 | Disposition: A | Discharge status: Home / Self Care | Payer: Self-pay | Attending: Emergency Medicine | Admitting: Emergency Medicine

## 2012-07-17 ENCOUNTER — Encounter (HOSPITAL_COMMUNITY): Payer: Self-pay

## 2012-07-17 DIAGNOSIS — F17200 Nicotine dependence, unspecified, uncomplicated: Secondary | ICD-10-CM | POA: Insufficient documentation

## 2012-07-17 DIAGNOSIS — J3489 Other specified disorders of nose and nasal sinuses: Secondary | ICD-10-CM | POA: Insufficient documentation

## 2012-07-17 DIAGNOSIS — N342 Other urethritis: Secondary | ICD-10-CM | POA: Insufficient documentation

## 2012-07-17 DIAGNOSIS — K649 Unspecified hemorrhoids: Secondary | ICD-10-CM | POA: Insufficient documentation

## 2012-07-17 DIAGNOSIS — R05 Cough: Secondary | ICD-10-CM | POA: Insufficient documentation

## 2012-07-17 DIAGNOSIS — R509 Fever, unspecified: Secondary | ICD-10-CM | POA: Insufficient documentation

## 2012-07-17 DIAGNOSIS — J40 Bronchitis, not specified as acute or chronic: Secondary | ICD-10-CM | POA: Insufficient documentation

## 2012-07-17 DIAGNOSIS — K219 Gastro-esophageal reflux disease without esophagitis: Secondary | ICD-10-CM | POA: Insufficient documentation

## 2012-07-17 DIAGNOSIS — Z79899 Other long term (current) drug therapy: Secondary | ICD-10-CM | POA: Insufficient documentation

## 2012-07-17 DIAGNOSIS — R059 Cough, unspecified: Secondary | ICD-10-CM | POA: Insufficient documentation

## 2012-07-17 LAB — URINALYSIS, ROUTINE W REFLEX MICROSCOPIC
Bilirubin Urine: NEGATIVE
Ketones, ur: NEGATIVE mg/dL
Nitrite: NEGATIVE
Protein, ur: NEGATIVE mg/dL
Specific Gravity, Urine: >1.03 — ABNORMAL HIGH (ref 1.005–1.030)
Urobilinogen, UA: 0.2 mg/dL (ref 0.0–1.0)

## 2012-07-17 MED ORDER — OXYMETAZOLINE HCL 0.05 % NA SOLN
1.0000 | Freq: Once | NASAL | Status: AC
  Administered 2012-07-17: 1 via NASAL
  Filled 2012-07-17: qty 15

## 2012-07-17 MED ORDER — CEFTRIAXONE SODIUM 250 MG IJ SOLR
250.0000 mg | Freq: Once | INTRAMUSCULAR | Status: AC
  Administered 2012-07-17: 250 mg via INTRAMUSCULAR
  Filled 2012-07-17: qty 250

## 2012-07-17 MED ORDER — AZITHROMYCIN 250 MG PO TABS
1000.0000 mg | ORAL_TABLET | Freq: Once | ORAL | Status: AC
  Administered 2012-07-17: 1000 mg via ORAL
  Filled 2012-07-17: qty 4

## 2012-07-17 NOTE — ED Provider Notes (Signed)
History  This chart was scribed for Philip Quarry, MD, by Candelaria Stagers, ED Scribe. This patient was seen in room APA02/APA02 and the patient's care was started at 6:12 PM  CSN: [id number]  Arrival date & time 07/17/12  1719   First MD Initiated Contact with Patient 07/17/12 1807      Chief Complaint  Patient presents with  . Penile Discharge  . Cough  . Nasal Congestion  . Fever  . Hemorrhoids    The history is provided by the patient. No language interpreter was used.   Philip Gonzalez is a 47 y.o. male who presents to the Emergency Department complaining of penile discharge that started 3-4 days ago.  He is experiencing penile pain, increased urine frequency, and dysuria.  Pt is sexually active reporting he uses protection.  He has never experienced penile discharge before.  Pt reports he was diagnosed with trichomonas last year and was treated.  Pt is also experiencing a productive cough and congestion that started about one week ago.  He reports he was treated for a sinus infection about one month ago with amoxicillin.  He denies sore throat.  Pt reports he has taken Mucinex with little relief.  Pt smokes.  He takes Prilosec daily for GERD.       Past Medical History  Diagnosis Date  . Acid reflux     History reviewed. No pertinent past surgical history.  No family history on file.  History  Substance Use Topics  . Smoking status: Current Every Day Smoker -- 1.00 packs/day    Types: Cigarettes  . Smokeless tobacco: Not on file  . Alcohol Use: Yes     Comment: occ      Review of Systems  HENT: Positive for congestion.   Respiratory: Positive for cough.   Genitourinary: Positive for dysuria, frequency, discharge and penile pain. Negative for testicular pain.  All other systems reviewed and are negative.    Allergies  Bee venom and Poison oak extract  Home Medications   Current Outpatient Rx  Name  Route  Sig  Dispense  Refill  . amoxicillin (AMOXIL) 500  MG capsule   Oral   Take 1 capsule (500 mg total) by mouth 3 (three) times daily.   30 capsule   0   . hydrOXYzine (VISTARIL) 25 MG capsule      1 at hs or q6h prn itching.   20 capsule   0   . Multiple Vitamin (MULTIVITAMIN) tablet   Oral   Take 1 tablet by mouth daily.           Marland Kitchen omeprazole (PRILOSEC) 20 MG capsule   Oral   Take 20 mg by mouth daily.           BP 155/98  Pulse 87  Temp(Src) 97.6 F (36.4 C) (Oral)  Resp 18  Ht 5\' 7"  (1.702 m)  Wt 210 lb (95.255 kg)  BMI 32.88 kg/m2  SpO2 100%  Physical Exam  Nursing note and vitals reviewed. Constitutional: He is oriented to person, place, and time. He appears well-developed and well-nourished. No distress.  HENT:  Head: Normocephalic.  Neck: Normal range of motion. Neck supple.  Cardiovascular: Normal rate and regular rhythm.   Pulmonary/Chest: Effort normal and breath sounds normal. He has no wheezes.  Abdominal: Soft. There is no tenderness.  Genitourinary:  Circumcised male.  Normal testicles. No obvious discharge.  Urethral swabbing obtained.    Musculoskeletal: Normal range of motion. He  exhibits no edema and no tenderness.  Neurological: He is alert and oriented to person, place, and time.  Skin: Skin is warm and dry. He is not diaphoretic.  Psychiatric: He has a normal mood and affect. His behavior is normal.    ED Course  Procedures  DIAGNOSTIC STUDIES: Oxygen Saturation is 100% on room air, normal by my interpretation.    COORDINATION OF CARE:  6:20 PM Will treat for STD.  Pt understands and agrees.  6:24 PM Ordered:  GC/Chlamydia Probe Amp; Urinalysis, Routine w reflex microscopic 6:30 PM Ordered: oxymetazoline (AFRIN) 0.05% nasal spray 1 spray    Labs Reviewed  URINALYSIS, ROUTINE W REFLEX MICROSCOPIC - Abnormal; Notable for the following:    Specific Gravity, Urine >1.030 (*)    All other components within normal limits  GC/CHLAMYDIA PROBE AMP   No results found.   No  diagnosis found.    MDM  I personally performed the services described in this documentation, which was scribed in my presence. The recorded information has been reviewed and considered.         Philip Quarry, MD 07/31/12 1500

## 2012-07-17 NOTE — ED Provider Notes (Signed)
History     CSN: [id number]  Arrival date & time 07/17/12  1719   First MD Initiated Contact with Patient 07/17/12 1807      Chief Complaint  Patient presents with  . Penile Discharge  . Cough  . Nasal Congestion  . Fever  . Hemorrhoids    (Consider location/radiation/quality/duration/timing/severity/associated sxs/prior treatment) HPI  Past Medical History  Diagnosis Date  . Acid reflux     History reviewed. No pertinent past surgical history.  No family history on file.  History  Substance Use Topics  . Smoking status: Current Every Day Smoker -- 1.00 packs/day    Types: Cigarettes  . Smokeless tobacco: Not on file  . Alcohol Use: Yes     Comment: occ      Review of Systems  Allergies  Bee venom and Poison oak extract  Home Medications   Current Outpatient Rx  Name  Route  Sig  Dispense  Refill  . amoxicillin (AMOXIL) 500 MG capsule   Oral   Take 1 capsule (500 mg total) by mouth 3 (three) times daily.   30 capsule   0   . hydrOXYzine (VISTARIL) 25 MG capsule      1 at hs or q6h prn itching.   20 capsule   0   . Multiple Vitamin (MULTIVITAMIN) tablet   Oral   Take 1 tablet by mouth daily.           Marland Kitchen omeprazole (PRILOSEC) 20 MG capsule   Oral   Take 20 mg by mouth daily.           BP 155/98  Pulse 87  Temp(Src) 97.6 F (36.4 C) (Oral)  Resp 18  Ht 5\' 7"  (1.702 m)  Wt 210 lb (95.255 kg)  BMI 32.88 kg/m2  SpO2 100%  Physical Exam  ED Course  Procedures (including critical care time)  Labs Reviewed  URINALYSIS, ROUTINE W REFLEX MICROSCOPIC - Abnormal; Notable for the following:    Specific Gravity, Urine >1.030 (*)    All other components within normal limits  GC/CHLAMYDIA PROBE AMP   No results found.   1. Urethritis   2. Bronchitis       MDM  Rocephin and zithromax given here.        Hilario Quarry, MD 07/17/12 [phone]

## 2012-07-17 NOTE — ED Notes (Signed)
1. Pt reports penile discharge for 3-4 days,  White in color.  No odor. Fever at times, +painful urination.  2.  Cough and congestion for 2 weeks, ?fever at times. 3. Hemorrhoids for 1 week, bleeding a times 4. Rash to body and genital area for 1 week, skin is peeling on penis area.

## 2012-07-18 LAB — GC/CHLAMYDIA PROBE AMP
CT Probe RNA: NEGATIVE
GC Probe RNA: NEGATIVE

## 2012-11-23 ENCOUNTER — Encounter (HOSPITAL_COMMUNITY): Payer: Self-pay | Admitting: *Deleted

## 2012-11-23 ENCOUNTER — Emergency Department (HOSPITAL_COMMUNITY)
Admission: EM | Admit: 2012-11-23 | Discharge: 2012-11-23 | Disposition: A | Discharge status: Home / Self Care | Payer: Self-pay | Attending: Emergency Medicine | Admitting: Emergency Medicine

## 2012-11-23 DIAGNOSIS — K219 Gastro-esophageal reflux disease without esophagitis: Secondary | ICD-10-CM | POA: Insufficient documentation

## 2012-11-23 DIAGNOSIS — Z79899 Other long term (current) drug therapy: Secondary | ICD-10-CM | POA: Insufficient documentation

## 2012-11-23 DIAGNOSIS — N342 Other urethritis: Secondary | ICD-10-CM | POA: Insufficient documentation

## 2012-11-23 DIAGNOSIS — F17200 Nicotine dependence, unspecified, uncomplicated: Secondary | ICD-10-CM | POA: Insufficient documentation

## 2012-11-23 LAB — URINALYSIS, ROUTINE W REFLEX MICROSCOPIC
Hgb urine dipstick: NEGATIVE
Ketones, ur: NEGATIVE mg/dL
Protein, ur: NEGATIVE mg/dL
Urobilinogen, UA: 0.2 mg/dL (ref 0.0–1.0)

## 2012-11-23 MED ORDER — AZITHROMYCIN 1 G PO PACK
1.0000 g | PACK | Freq: Once | ORAL | Status: DC
  Filled 2012-11-23: qty 1

## 2012-11-23 MED ORDER — CEFTRIAXONE SODIUM 250 MG IJ SOLR
250.0000 mg | Freq: Once | INTRAMUSCULAR | Status: AC
  Administered 2012-11-23: 250 mg via INTRAMUSCULAR
  Filled 2012-11-23: qty 250

## 2012-11-23 MED ORDER — AZITHROMYCIN 250 MG PO TABS
1000.0000 mg | ORAL_TABLET | Freq: Once | ORAL | Status: AC
  Administered 2012-11-23: 1000 mg via ORAL
  Filled 2012-11-23: qty 4

## 2012-11-23 MED ORDER — LIDOCAINE HCL (PF) 1 % IJ SOLN
INTRAMUSCULAR | Status: AC
  Administered 2012-11-23: 1 mL
  Filled 2012-11-23: qty 5

## 2012-11-23 NOTE — ED Notes (Signed)
Pt c/o burning with urination, penile discharge for the past three days, urine sample obtained, sent to lab

## 2012-11-23 NOTE — ED Notes (Signed)
Dysuria, penile d/c for 3-4 days ,  Nausea, no vomiting.

## 2012-11-23 NOTE — ED Provider Notes (Signed)
History  This chart was scribed for Donnetta Hutching, MD by Ardelia Mems, ED Scribe. This patient was seen in room APFT20/APFT20 and the patient's care was started at 3:46 PM.  CSN: [id number]  Arrival date & time 11/23/12  1533    Chief Complaint  Patient presents with  . Dysuria    The history is provided by the patient. No language interpreter was used.   HPI Comments: Philip Gonzalez is a 47 y.o. male who presents to the Emergency Department complaining of 4 days of dysuria with associated penile discharge. Pt reports having milky, white urethral discharge. Pt states that he is sexually active with one partner but states that his sexual partner is not aware of having an infection. Pt is an occasional alcohol user and a current every day smoker of 1 pack/day. Pt denies fever, nausea, vomiting or any other symptoms.  PCP- Dr. Mirna Mires   Past Medical History  Diagnosis Date  . Acid reflux    History reviewed. No pertinent past surgical history. History reviewed. No pertinent family history. History  Substance Use Topics  . Smoking status: Current Every Day Smoker -- 1.00 packs/day    Types: Cigarettes  . Smokeless tobacco: Not on file  . Alcohol Use: Yes     Comment: occ    Review of Systems  Constitutional: Negative for fever.  Gastrointestinal: Negative for nausea and vomiting.  Genitourinary: Positive for dysuria and discharge.   A complete 10 system review of systems was obtained and all systems are negative except as noted in the HPI and PMH.   Allergies  Bee venom and Poison oak extract  Home Medications   Current Outpatient Rx  Name  Route  Sig  Dispense  Refill  . Multiple Vitamin (MULTIVITAMIN) tablet   Oral   Take 1 tablet by mouth daily.           Marland Kitchen omeprazole (PRILOSEC) 20 MG capsule   Oral   Take 20 mg by mouth daily.          Triage Vitals: BP 145/98  Pulse 86  Temp(Src) 97.2 F (36.2 C) (Oral)  Resp 20  Ht 5\' 7"  (1.702 m)  Wt 195 lb  (88.451 kg)  BMI 30.53 kg/m2  SpO2 98%  Physical Exam  Nursing note and vitals reviewed. Constitutional: He is oriented to person, place, and time. He appears well-developed and well-nourished.  HENT:  Head: Normocephalic and atraumatic.  Abdominal: Soft. Bowel sounds are normal. There is no tenderness.  Genitourinary: Penis normal. No penile tenderness.  No discharge at present time.  Lymphadenopathy:       Right: No inguinal adenopathy present.       Left: No inguinal adenopathy present.  Neurological: He is alert and oriented to person, place, and time.  Skin: Skin is warm and dry.  Psychiatric: He has a normal mood and affect.    ED Course  Procedures (including critical care time)  DIAGNOSTIC STUDIES: Oxygen Saturation is 98% on RA, normal by my interpretation.    COORDINATION OF CARE: 4:14 PM- Pt advised of plan to be tested for GC/Chlamydia and have urinalysis performed and pt agrees.    Labs Reviewed  URINALYSIS, ROUTINE W REFLEX MICROSCOPIC - Abnormal; Notable for the following:    Specific Gravity, Urine >1.030 (*)    Bilirubin Urine SMALL (*)    All other components within normal limits  GC/CHLAMYDIA PROBE AMP   No results found.  No diagnosis found.  MDM  History and physical consistent with urethritis.   Rx ceftriaxone 250 mg IM and Zithromax 1 g by mouth.  Urethral culture obtained         I personally performed the services described in this documentation, which was scribed in my presence. The recorded information has been reviewed and is accurate.    Donnetta Hutching, MD 11/23/12 1714

## 2013-01-12 ENCOUNTER — Encounter (HOSPITAL_COMMUNITY): Payer: Self-pay | Admitting: *Deleted

## 2013-01-12 ENCOUNTER — Emergency Department (HOSPITAL_COMMUNITY)
Admission: EM | Admit: 2013-01-12 | Discharge: 2013-01-12 | Disposition: A | Discharge status: Home / Self Care | Payer: Self-pay | Attending: Emergency Medicine | Admitting: Emergency Medicine

## 2013-01-12 DIAGNOSIS — R209 Unspecified disturbances of skin sensation: Secondary | ICD-10-CM | POA: Insufficient documentation

## 2013-01-12 DIAGNOSIS — J069 Acute upper respiratory infection, unspecified: Secondary | ICD-10-CM

## 2013-01-12 DIAGNOSIS — J3489 Other specified disorders of nose and nasal sinuses: Secondary | ICD-10-CM | POA: Insufficient documentation

## 2013-01-12 DIAGNOSIS — F17200 Nicotine dependence, unspecified, uncomplicated: Secondary | ICD-10-CM | POA: Insufficient documentation

## 2013-01-12 DIAGNOSIS — N342 Other urethritis: Secondary | ICD-10-CM

## 2013-01-12 DIAGNOSIS — R369 Urethral discharge, unspecified: Secondary | ICD-10-CM | POA: Insufficient documentation

## 2013-01-12 DIAGNOSIS — R509 Fever, unspecified: Secondary | ICD-10-CM | POA: Insufficient documentation

## 2013-01-12 DIAGNOSIS — K219 Gastro-esophageal reflux disease without esophagitis: Secondary | ICD-10-CM | POA: Insufficient documentation

## 2013-01-12 DIAGNOSIS — Z79899 Other long term (current) drug therapy: Secondary | ICD-10-CM | POA: Insufficient documentation

## 2013-01-12 DIAGNOSIS — R350 Frequency of micturition: Secondary | ICD-10-CM | POA: Insufficient documentation

## 2013-01-12 DIAGNOSIS — R300 Dysuria: Secondary | ICD-10-CM | POA: Insufficient documentation

## 2013-01-12 LAB — URINALYSIS, ROUTINE W REFLEX MICROSCOPIC
Bilirubin Urine: NEGATIVE
Hgb urine dipstick: NEGATIVE
Specific Gravity, Urine: >1.03 — ABNORMAL HIGH (ref 1.005–1.030)
pH: 6 (ref 5.0–8.0)

## 2013-01-12 MED ORDER — CEFTRIAXONE SODIUM 250 MG IJ SOLR
250.0000 mg | Freq: Once | INTRAMUSCULAR | Status: AC
  Administered 2013-01-12: 250 mg via INTRAMUSCULAR
  Filled 2013-01-12: qty 250

## 2013-01-12 MED ORDER — HYDROCODONE-ACETAMINOPHEN 5-325 MG PO TABS
2.0000 | ORAL_TABLET | Freq: Once | ORAL | Status: AC
  Administered 2013-01-12: 2 via ORAL
  Filled 2013-01-12: qty 2

## 2013-01-12 MED ORDER — AZITHROMYCIN 250 MG PO TABS
1000.0000 mg | ORAL_TABLET | Freq: Once | ORAL | Status: AC
  Administered 2013-01-12: 1000 mg via ORAL
  Filled 2013-01-12: qty 4

## 2013-01-12 MED ORDER — LIDOCAINE HCL (PF) 1 % IJ SOLN
INTRAMUSCULAR | Status: AC
  Administered 2013-01-12: 1.2 mL
  Filled 2013-01-12: qty 5

## 2013-01-12 NOTE — ED Notes (Signed)
Pt's urine is placed in blue bin at the nurses station.

## 2013-01-12 NOTE — ED Notes (Signed)
C/o headache, fever, and sinus congestion x 3 days.

## 2013-01-12 NOTE — ED Provider Notes (Signed)
CSN: [id number]     Arrival date & time 01/12/13  1011 History     First MD Initiated Contact with Patient 01/12/13 1044     Chief Complaint  Patient presents with  . Headache  . Fever  . Nasal Congestion   (Consider location/radiation/quality/duration/timing/severity/associated sxs/prior Treatment) HPI Comments: Patient complains of frontal headache, intermittent fever, and sinus congestion for 3 days. He describes the headache as a throbbing sensation to the front of his for head and around his eyes. He states onset of symptoms was gradual. He denies neck stiffness. Patient also complains of dysuria and penile discharge for 3 days. He reports unprotected sex with the same partner. He does report having some urinary frequency and burning sensation as well. He denies back pain or abdominal pain, chest tightness, shortness of breath, or vomiting.  Patient is a 47 y.o. male presenting with headaches. The history is provided by the patient.  Headache Pain location:  Frontal, L temporal and R temporal (Behind both eyes) Quality:  Dull (Throbbing) Radiates to:  Does not radiate Onset quality:  Gradual Duration:  3 days Timing:  Constant Progression:  Worsening Chronicity:  New Similar to prior headaches: yes   Context: coughing   Context: not activity and not exposure to bright light   Relieved by:  Nothing Worsened by:  Nothing tried Ineffective treatments:  None tried Associated symptoms: congestion, fever and sinus pressure   Associated symptoms: no abdominal pain, no back pain, no dizziness, no pain, no facial pain, no hearing loss, no nausea, no neck pain, no neck stiffness, no numbness, no paresthesias, no photophobia, no seizures, no sore throat, no syncope, no visual change, no vomiting and no weakness     Past Medical History  Diagnosis Date  . Acid reflux    History reviewed. No pertinent past surgical history. No family history on file. History  Substance Use Topics   . Smoking status: Current Every Day Smoker -- 1.00 packs/day    Types: Cigarettes  . Smokeless tobacco: Not on file  . Alcohol Use: Yes     Comment: occ    Review of Systems  Constitutional: Positive for fever. Negative for activity change and appetite change.  HENT: Positive for congestion and sinus pressure. Negative for hearing loss, sore throat, facial swelling, trouble swallowing, neck pain and neck stiffness.   Eyes: Negative for photophobia, pain and visual disturbance.  Respiratory: Negative for shortness of breath.   Cardiovascular: Negative for chest pain and syncope.  Gastrointestinal: Negative for nausea, vomiting and abdominal pain.  Genitourinary: Positive for dysuria and discharge. Negative for hematuria, flank pain, decreased urine volume, penile swelling, scrotal swelling, difficulty urinating, genital sores, penile pain and testicular pain.  Musculoskeletal: Negative for back pain.  Skin: Negative for rash and wound.  Neurological: Positive for headaches. Negative for dizziness, seizures, facial asymmetry, speech difficulty, weakness, numbness and paresthesias.  Psychiatric/Behavioral: Negative for confusion and decreased concentration.  All other systems reviewed and are negative.    Allergies  Bee venom and Poison oak extract  Home Medications   Current Outpatient Rx  Name  Route  Sig  Dispense  Refill  . Multiple Vitamin (MULTIVITAMIN) tablet   Oral   Take 1 tablet by mouth daily.           Marland Kitchen omeprazole (PRILOSEC) 20 MG capsule   Oral   Take 20 mg by mouth daily.          BP 143/116  Pulse 70  Temp(Src)  98 F (36.7 C) (Oral)  Resp 16  Ht 5\' 7"  (1.702 m)  Wt 200 lb (90.719 kg)  BMI 31.32 kg/m2  SpO2 95% Physical Exam  Nursing note and vitals reviewed. Constitutional: He is oriented to person, place, and time. He appears well-developed and well-nourished. No distress.  HENT:  Head: Normocephalic and atraumatic.  Mouth/Throat: Oropharynx is  clear and moist.  Eyes: EOM are normal. Pupils are equal, round, and reactive to light.  Neck: Normal range of motion and phonation normal. Neck supple. No rigidity. No Brudzinski's sign and no Kernig's sign noted.  Cardiovascular: Normal rate, regular rhythm, normal heart sounds and intact distal pulses.   No murmur heard. Pulmonary/Chest: Effort normal and breath sounds normal. No respiratory distress.  Abdominal: Soft. He exhibits no distension. There is no tenderness. There is no rebound and no guarding.  Genitourinary: Testes normal. Cremasteric reflex is present. Left testis shows no swelling and no tenderness. Circumcised. No phimosis, paraphimosis or penile tenderness.  Musculoskeletal: Normal range of motion.  Neurological: He is alert and oriented to person, place, and time. No cranial nerve deficit or sensory deficit. He exhibits normal muscle tone. Coordination and gait normal.  Reflex Scores:      Tricep reflexes are 2+ on the right side and 2+ on the left side.      Bicep reflexes are 2+ on the right side and 2+ on the left side. Skin: Skin is warm and dry.    ED Course   Procedures (including critical care time)  Labs Reviewed  URINALYSIS, ROUTINE W REFLEX MICROSCOPIC - Abnormal; Notable for the following:    Specific Gravity, Urine >1.030 (*)    All other components within normal limits  GC/CHLAMYDIA PROBE AMP   GC and Chlamydia results are pending  MDM    Vitals stable,  Pt is non-toxic appearing.  No focal neuro deficits, no meningeal signs.  Headache of gradual onset that is similar to previous.   No concerning sx's for [facility] or meningitis  UA today was negative. Since patient complains of penile discharge and dysuria will treat with IM Rocephin and Zithromax. No penile discharge on exam.  Symptoms are likely related to urethritis and likely URI    Patient agrees to close f/u with PMD or to return here if the symptoms worsen.   Usbaldo Pannone L. Trisha Mangle, PA-C 01/14/13  1655

## 2013-01-14 LAB — GC/CHLAMYDIA PROBE AMP
CT Probe RNA: NEGATIVE
GC Probe RNA: NEGATIVE

## 2013-01-15 NOTE — ED Provider Notes (Signed)
Medical screening examination/treatment/procedure(s) were performed by non-physician practitioner and as supervising physician I was immediately available for consultation/collaboration.   Lamari Beckles W. Miciah Covelli, MD 01/15/13 0752 

## 2013-02-08 ENCOUNTER — Emergency Department (HOSPITAL_COMMUNITY)
Admission: EM | Admit: 2013-02-08 | Discharge: 2013-02-08 | Disposition: A | Discharge status: Home / Self Care | Payer: Self-pay | Attending: Emergency Medicine | Admitting: Emergency Medicine

## 2013-02-08 ENCOUNTER — Emergency Department (HOSPITAL_COMMUNITY): Payer: Self-pay

## 2013-02-08 ENCOUNTER — Encounter (HOSPITAL_COMMUNITY): Payer: Self-pay | Admitting: Emergency Medicine

## 2013-02-08 DIAGNOSIS — J40 Bronchitis, not specified as acute or chronic: Secondary | ICD-10-CM | POA: Insufficient documentation

## 2013-02-08 DIAGNOSIS — R0981 Nasal congestion: Secondary | ICD-10-CM

## 2013-02-08 DIAGNOSIS — Z79899 Other long term (current) drug therapy: Secondary | ICD-10-CM | POA: Insufficient documentation

## 2013-02-08 DIAGNOSIS — F17200 Nicotine dependence, unspecified, uncomplicated: Secondary | ICD-10-CM | POA: Insufficient documentation

## 2013-02-08 DIAGNOSIS — R6883 Chills (without fever): Secondary | ICD-10-CM | POA: Insufficient documentation

## 2013-02-08 DIAGNOSIS — J3489 Other specified disorders of nose and nasal sinuses: Secondary | ICD-10-CM | POA: Insufficient documentation

## 2013-02-08 DIAGNOSIS — IMO0001 Reserved for inherently not codable concepts without codable children: Secondary | ICD-10-CM | POA: Insufficient documentation

## 2013-02-08 DIAGNOSIS — H9209 Otalgia, unspecified ear: Secondary | ICD-10-CM | POA: Insufficient documentation

## 2013-02-08 DIAGNOSIS — R059 Cough, unspecified: Secondary | ICD-10-CM | POA: Insufficient documentation

## 2013-02-08 DIAGNOSIS — K219 Gastro-esophageal reflux disease without esophagitis: Secondary | ICD-10-CM | POA: Insufficient documentation

## 2013-02-08 DIAGNOSIS — R509 Fever, unspecified: Secondary | ICD-10-CM | POA: Insufficient documentation

## 2013-02-08 DIAGNOSIS — R05 Cough: Secondary | ICD-10-CM | POA: Insufficient documentation

## 2013-02-08 MED ORDER — GUAIFENESIN 100 MG/5ML PO LIQD: 100.0000 mg | ORAL | Status: DC | PRN

## 2013-02-08 MED ORDER — PSEUDOEPHEDRINE HCL 60 MG PO TABS: 60.0000 mg | ORAL_TABLET | Freq: Four times a day (QID) | ORAL | Status: DC | PRN

## 2013-02-08 NOTE — ED Notes (Signed)
Pt c/o productive cough, sore throat, and congestion since Tuesday. Pt denies SOB, chest pain.

## 2013-02-08 NOTE — ED Provider Notes (Addendum)
CSN: [id number]     Arrival date & time 02/08/13  1201 History   First MD Initiated Contact with Patient 02/08/13 1206     Chief Complaint  Patient presents with  . Nasal Congestion  . Cough   (Consider location/radiation/quality/duration/timing/severity/associated sxs/prior Treatment) Patient is a 47 y.o. male presenting with cough. The history is provided by the patient.  Cough Cough characteristics:  Productive Sputum characteristics:  Green, yellow and bloody Severity:  Moderate Onset quality:  Gradual Duration:  3 days Timing:  Intermittent Progression:  Worsening Chronicity:  New Smoker: yes   Relieved by:  Nothing Worsened by:  Lying down and smoking Ineffective treatments:  None tried Associated symptoms: chills, ear fullness, ear pain, fever, myalgias, sinus congestion and sore throat   Associated symptoms: no headaches and no shortness of breath    COLEY KULIKOWSKI is a 47 y.o. male who presents to the ED with cough, sore throat and congestion that started 3 days ago.   Past Medical History  Diagnosis Date  . Acid reflux    History reviewed. No pertinent past surgical history. History reviewed. No pertinent family history. History  Substance Use Topics  . Smoking status: Current Every Day Smoker -- 1.00 packs/day    Types: Cigarettes  . Smokeless tobacco: Not on file  . Alcohol Use: Yes     Comment: occ    Review of Systems  Constitutional: Positive for fever and chills.  HENT: Positive for ear pain and sore throat.   Respiratory: Positive for cough. Negative for shortness of breath.   Gastrointestinal: Negative for nausea and vomiting.  Musculoskeletal: Positive for myalgias.  Allergic/Immunologic: Negative for immunocompromised state.  Neurological: Negative for headaches.    Allergies  Bee venom and Poison oak extract  Home Medications   Current Outpatient Rx  Name  Route  Sig  Dispense  Refill  . Multiple Vitamin (MULTIVITAMIN) tablet   Oral  Take 1 tablet by mouth daily.           Marland Kitchen omeprazole (PRILOSEC) 20 MG capsule   Oral   Take 20 mg by mouth daily.          BP 156/98  Pulse 76  Temp(Src) 97.8 F (36.6 C) (Oral)  Resp 18  SpO2 100% Physical Exam  Nursing note and vitals reviewed. Constitutional: He is oriented to person, place, and time. He appears well-developed and well-nourished. No distress.  HENT:  Head: Normocephalic and atraumatic.  Right Ear: Tympanic membrane normal.  Left Ear: Tympanic membrane normal.  Mouth/Throat: Uvula is midline and mucous membranes are normal. Posterior oropharyngeal erythema present.  Eyes: EOM are normal.  Neck: Neck supple.  Cardiovascular: Normal rate, regular rhythm and normal heart sounds.   Pulmonary/Chest: Effort normal. He has decreased breath sounds. He has no wheezes. Rhonchi: occasional. He has no rales.  Abdominal: Soft. There is no tenderness.  Musculoskeletal: Normal range of motion.  Neurological: He is alert and oriented to person, place, and time. No cranial nerve deficit.  Skin: Skin is warm and dry.  Psychiatric: He has a normal mood and affect. His behavior is normal.    ED Course  Procedures   MDM  47 y.o. male with sinus congestion and cough. He was evaluated at [facility] earlier this weeks and treated with a shot of Rocephin and Cipro x 7 days for possible STD. I will treat him with sudafed and cough medication in addition to the antibiotics he was given earlier in the week.  Patient to return for any problems.  Discussed with the patient and all questioned fully answered. Encouraged patient to stop smoking.   Medication List    TAKE these medications       guaiFENesin 100 MG/5ML liquid  Commonly known as:  ROBITUSSIN  Take 5-10 mLs (100-200 mg total) by mouth every 4 (four) hours as needed for cough.     pseudoephedrine 60 MG tablet  Commonly known as:  SUDAFED  Take 1 tablet (60 mg total) by mouth every 6 (six) hours as needed for  congestion.      ASK your doctor about these medications       ciprofloxacin 500 MG tablet  Commonly known as:  CIPRO  Take 500 mg by mouth 2 (two) times daily. Pt started on 02/07/13, for 7 days     multivitamin tablet  Take 1 tablet by mouth daily.     omeprazole 20 MG capsule  Commonly known as:  PRILOSEC  Take 20 mg by mouth daily.           Snelling, NP 02/08/13 [address] [place], [place] 02/16/13 [address] [place], [place] 02/21/13 1945

## 2013-02-08 NOTE — ED Provider Notes (Signed)
Medical screening examination/treatment/procedure(s) were performed by non-physician practitioner and as supervising physician I was immediately available for consultation/collaboration.   Ott Zimmerle L Emitt Maglione, MD 02/08/13 1443 

## 2013-02-16 NOTE — ED Provider Notes (Signed)
Medical screening examination/treatment/procedure(s) were performed by non-physician practitioner and as supervising physician I was immediately available for consultation/collaboration.   Benny Lennert, MD 02/16/13 1322

## 2013-02-27 NOTE — ED Provider Notes (Signed)
Medical screening examination/treatment/procedure(s) were performed by non-physician practitioner and as supervising physician I was immediately available for consultation/collaboration.   Anella Nakata L Kadra Kohan, MD 02/27/13 0001 

## 2013-06-29 ENCOUNTER — Emergency Department (HOSPITAL_COMMUNITY)
Admission: EM | Admit: 2013-06-29 | Discharge: 2013-06-29 | Disposition: A | Discharge status: Home / Self Care | Payer: Self-pay | Attending: Emergency Medicine | Admitting: Emergency Medicine

## 2013-06-29 ENCOUNTER — Encounter (HOSPITAL_COMMUNITY): Payer: Self-pay | Admitting: Emergency Medicine

## 2013-06-29 DIAGNOSIS — K219 Gastro-esophageal reflux disease without esophagitis: Secondary | ICD-10-CM | POA: Insufficient documentation

## 2013-06-29 DIAGNOSIS — Z79899 Other long term (current) drug therapy: Secondary | ICD-10-CM | POA: Insufficient documentation

## 2013-06-29 DIAGNOSIS — N342 Other urethritis: Secondary | ICD-10-CM | POA: Insufficient documentation

## 2013-06-29 DIAGNOSIS — Z87891 Personal history of nicotine dependence: Secondary | ICD-10-CM | POA: Insufficient documentation

## 2013-06-29 LAB — URINALYSIS W MICROSCOPIC + REFLEX CULTURE
BILIRUBIN URINE: NEGATIVE
Glucose, UA: NEGATIVE mg/dL
Hgb urine dipstick: NEGATIVE
Ketones, ur: NEGATIVE mg/dL
Leukocytes, UA: NEGATIVE
Nitrite: NEGATIVE
PH: 6 (ref 5.0–8.0)
PROTEIN: NEGATIVE mg/dL
Specific Gravity, Urine: >1.03 — ABNORMAL HIGH (ref 1.005–1.030)
Urobilinogen, UA: 0.2 mg/dL (ref 0.0–1.0)

## 2013-06-29 MED ORDER — CEFTRIAXONE SODIUM 250 MG IJ SOLR
250.0000 mg | Freq: Once | INTRAMUSCULAR | Status: AC
  Administered 2013-06-29: 250 mg via INTRAMUSCULAR
  Filled 2013-06-29: qty 250

## 2013-06-29 MED ORDER — LIDOCAINE HCL (PF) 1 % IJ SOLN
INTRAMUSCULAR | Status: AC
  Administered 2013-06-29: 1 mL
  Filled 2013-06-29: qty 5

## 2013-06-29 MED ORDER — AZITHROMYCIN 250 MG PO TABS
1000.0000 mg | ORAL_TABLET | Freq: Once | ORAL | Status: AC
  Administered 2013-06-29: 1000 mg via ORAL
  Filled 2013-06-29: qty 4

## 2013-06-29 NOTE — Discharge Instructions (Signed)
°Emergency Department Resource Guide °1) Find a Doctor and Pay Out of Pocket °Although you won't have to find out who is covered by your insurance plan, it is a good idea to ask around and get recommendations. You will then need to call the office and see if the doctor you have chosen will accept you as a new patient and what types of options they offer for patients who are self-pay. Some doctors offer discounts or will set up payment plans for their patients who do not have insurance, but you will need to ask so you aren't surprised when you get to your appointment. ° °2) Contact Your Local Health Department °Not all health departments have doctors that can see patients for sick visits, but many do, so it is worth a call to see if yours does. If you don't know where your local health department is, you can check in your phone book. The CDC also has a tool to help you locate your state's health department, and many state websites also have listings of all of their local health departments. ° °3) Find a Walk-in Clinic °If your illness is not likely to be very severe or complicated, you may want to try a walk in clinic. These are popping up all over the country in pharmacies, drugstores, and shopping centers. They're usually staffed by nurse practitioners or physician assistants that have been trained to treat common illnesses and complaints. They're usually fairly quick and inexpensive. However, if you have serious medical issues or chronic medical problems, these are probably not your best option. ° °No Primary Care Doctor: °- Call Health Connect at  832-8000 - they can help you locate a primary care doctor that  accepts your insurance, provides certain services, etc. °- Physician Referral Service- 1-800-533-3463 ° °Chronic Pain Problems: °Organization         Address  Phone   Notes  °[facility]  (336) 297-2271 Patients need to be referred by their primary care doctor.  ° °Medication  Assistance: °Organization         Address  Phone   Notes  °[place] [facility] [address] °[place], [place] [address] (336) 641-8030 --Must be a resident of [place] °-- Must have NO insurance coverage whatsoever (no Medicaid/ Medicare, etc.) °-- The pt. MUST have a primary care doctor that directs their care regularly and follows them in the community °  °[facility]  (866) 331-1348   °[employer] Way  (888) 892-1162   ° °Agencies that provide inexpensive medical care: °Organization         Address  Phone   Notes  °[facility]  (336) 832-8035   °[facility]    (336) 832-7272   °[facility] [address] °[place], [place] [address] (336) 832-4777   °[facility] [place] [facility] [address], °[place] (336) 271-4999   °[facility]    (336) 373-0678   °[facility]    (336) 272-1050   °[facility] ° [address], [place] Phone:  (336) 832-4444, Fax:  (336) 832-4440 Hours of Operation:  9 am - 6 pm, M-F.  Also accepts Medicaid/Medicare and self-pay.  °[facility] ° [address], [place] Phone: (336) 832-3150, Fax: (336) [phone]. Hours of Operation:  8:30 am - 5:30 pm, M-F.  Also accepts Medicaid and self-pay.  °[facility] [place] [address], [place] Phone: (336) 878-6027   °[facility] [address], [place], [place] (336)723-1848, Ext. 123 Mondays & Thursdays: 7-9 AM.  First 15 patients are seen on a first come, first serve basis. °  ° °Medicaid-accepting [place] Providers: ° °Organization         Address  Phone   Notes  °[facility] [address], [place] (336) 641-2100 Also accepts self-pay patients.  °[facility] [address], [place] ° (336) 856-9996   °[facility] [address], [place]  (336) 288-8857   °[facility] [address] [place] [address], [place] (336) 299-7000   °Veita Bland [address], [place]  ° (336) 373-1557 Only accepts [place] Access Medicaid patients after they have their name applied to their card.  ° °Self-Pay (no insurance) in [place]: ° °Organization         Address  Phone   Notes  °Sickle Cell Patients, [facility] [address], [place] (336) 832-1970   °[facility] [address], [place] (336) 832-4400   °[facility] ° [address], [place] (336) 992-4800   °[facility]/Dr. Osei-Bonsu ° [address] [place] [address], [place] or [address], [place] (336) 841-8500 Phone number for both [place] and [place] locations is the same.  °[facility] [address], [place] (336) 299-0000   °[facility] [address] [place] [address], [place] or [address] (336) 852-7530 °(336) 878-2260   °[facility] [address], [place] (336) 350-1642, phone; (336) 294-5005, fax Sees patients 1st and 3rd Saturday of every month.  Must not qualify for public or private insurance (i.e. Medicaid, Medicare, [place] Health Choice, Veterans' Benefits) • Household income should be no more than 200% of the poverty level •The clinic cannot treat you if you are pregnant or think you are pregnant • Sexually transmitted diseases are not treated at the clinic.  ° ° °Dental Care: °Organization         Address  Phone  Notes  °[place] [facility] [address], [place] (336) 641-6152 Accepts children up to age 21 who are enrolled in Medicaid or [place] Health Choice; pregnant women with a Medicaid card; and children who have applied for Medicaid or [place] Health Choice, but were declined, whose parents can pay a reduced fee at time of service.  °[place]  [facility] [place]  [address], [place] (336) 641-7733 Accepts children up to age 21 who are enrolled in Medicaid or [place] Health Choice; pregnant women with a Medicaid card; and children who have applied for Medicaid or [place] Health Choice, but were declined, whose parents can pay a reduced fee at time of service.  °[facility] ° [address], [place] (336) 641-4533 Patients are seen by appointment only. Walk-ins are not accepted. [facility] will see patients 18 years of age and older. °Monday - Tuesday (8am-5pm) °Most Wednesdays (8:30-5pm) °$30 per visit, cash only  °[facility] ° [address], [place] (336) 641-4533 Patients are seen by appointment only. Walk-ins are not accepted. [facility] will see patients 18 years of age and older. °One   Wednesday Evening (Monthly: Volunteer Based).  $30 per visit, cash only  °[facility]  (919) 537-3737 for adults; Children under age 4, call Graduate Pediatric Dentistry at (919) [phone]. Children aged 4-14, please call (919) [phone] to request a pediatric application. ° Dental services are provided in all areas of dental care including fillings, crowns and bridges, complete and partial dentures, implants, gum treatment, root canals, and extractions. Preventive care is also provided. Treatment is provided to both adults and children. °Patients are selected via a lottery and there is often a waiting list. °  °[facility] [address], °[place] ° (336) 763-8833 [email or web address] °  °[facility] [address], [place], [place] (336)723-1848, Ext. 123 Second and Fourth Thursday of each month, opens at 6:30 AM; Clinic ends at 9 AM.  Patients are seen on a first-come first-served basis, and a limited number are seen during each clinic.  ° °[facility] ° [address], [place], [place] (336) 723-7904    Eligibility Requirements °You must have lived in [place], [place], or [place] counties for at least the last three months. °  You cannot be eligible for state or federal sponsored healthcare insurance, including Veterans Administration, Medicaid, or Medicare. °  You generally cannot be eligible for healthcare insurance through your employer.  °  How to apply: °Eligibility screenings are held every Tuesday and Wednesday afternoon from 1:00 pm until 4:00 pm. You do not need an appointment for the interview!  °[facility] [address], [place], [place] 336-631-2330   °[facility]  336-342-8273   °[facility]  336-703-3100   °[facility]  336-570-6415   ° °Behavioral Health Resources in the Community: °Intensive Outpatient Programs °Organization         Address  Phone  Notes  °[facility] [address], [place], [place] 336-878-6098   °[facility] [address], [place], [place] 336-832-9800   °[facility] [address], [place], [place] ° 336-882-2125   °[facility] [address],  °[place], [place] 1-800-853-5163 or 336-641-4981   °Substance Abuse Resources °Organization         Address  Phone  Notes  °[facility]  336-882-2125   °[facility]  336-784-9470   °[facility]  336-285-9073   °[facility]  336-845-3988   °Residential & Outpatient Substance Abuse Program  1-800-659-3381   °Psychological Services °Organization         Address  Phone  Notes  °[facility]  336- 832-9600   °[facility]  336- 378-7881   °[facility] [address], [place] 1-800-853-5163 or 336-641-4981   ° °Mobile Crisis Teams °Organization         Address  Phone  Notes  °[facility], Mobile Crisis Care Unit  1-877-626-1772   °Assertive °[facility] ° [address]  [place], [place] 336-834-9664   °Sharon DeEsch [address] °[place] 336-554-5454   ° °Self-Help/Support Groups °Organization         Address  Phone             Notes  °[facility] of  - variety of support groups  336- 373-1402 Call for more information  °Narcotics Anonymous (NA), [facility] [address], °[place]  2 meetings at this location  ° °  Residential Treatment Programs Organization         Address  Phone  Notes  [facility] [address],    [place]  [phone]   [facility]  [address], [place] [phone], [place], [place] [phone]   [facility] [address], [place] [phone] Admissions: 8am-3pm M-F  [facility] [address]    [place], [place] [phone]   [facility] [address], [place], [place] [phone]   [facility] [address]  [place], [place] [phone]   [facility] Intensive Outpatient [address], [place], [place] [phone]   [facility] ([facility] [address]  [place], [place] [phone] or (351) [phone]   [facility] ([facility]) [address] [place], [place] [phone] Accepts Medicaid  [facility] [address]  [place] [phone] Substance Abuse/Addiction Treatment   [facility]         Address  Phone  Notes  [facility]  604-080-8726(888) 5142165457   Angie FavaJulie Brannon, PhD [address], [address] [place], [place]   [phone](336) 863-478-2883 or [phone](336) [phone]   [facility]    [address] [place], [place] [phone](336) ([phone]   [facility] [address], [place], [place] [phone](336) [phone] Insurance/Medicaid/sponsorship through [facility] [address]                                    [place], [place] [phone]) [phone] Therapy/tele-psych/case    [facility] [address]   [place], [place] [phone](336) [phone]    Dr. Lolly MustacheArfeen  [phone](336) [phone]   [facility] [place]  [employer] [facility] 1) [address], [place] 2) [address], [place] 3)  [address], [place] [phone](336) [phone](336) [phone]  [phone](336) [phone]   [facility] Child Abuse Hotline 8653286786(336) 409-002-7807 or ([phone])[phone](336) ([phone]) [phone] (After Hours)      Your gonorrhea and chlamydia culture is pending results, and you will receive a phone call in the next several days if it is positive.  However, you were treated empirically today with antibiotics for both gonorrhea and chlamydia.  Call your regular medical doctor on Monday to schedule a follow up appointment within the next week.  Return to the Emergency Department immediately if worsening.

## 2013-06-29 NOTE — ED Notes (Signed)
Pt c/o pain with urination and yellow penile discharge, groin pain that started last week

## 2013-06-29 NOTE — ED Provider Notes (Signed)
CSN: [id number]     Arrival date & time 06/29/13  1428 History   First MD Initiated Contact with Patient 06/29/13 1450     Chief Complaint  Patient presents with  . Dysuria  . Penile Discharge    HPI Pt was seen at 1455.  Per pt, c/o gradual onset and persistence of constant dysuria for the past 1 week. Has been associated with "yellow" penile discharge. Pt has hx of similar symptoms, dx urethritis, tx abx. Pt states he does not know if his sexual partner was treated at that time. Denies testicular pain/swelling, no perineal rash, no fevers, no abd pain, no N/V/D, no back pain.     Past Medical History  Diagnosis Date  . Acid reflux    History reviewed. No pertinent past surgical history.  History  Substance Use Topics  . Smoking status: Former Smoker -- 1.00 packs/day    Types: Cigarettes  . Smokeless tobacco: Not on file  . Alcohol Use: Yes     Comment: occ    Review of Systems ROS: Statement: All systems negative except as marked or noted in the HPI; Constitutional: Negative for fever and chills. ; ; Eyes: Negative for eye pain, redness and discharge. ; ; ENMT: Negative for ear pain, hoarseness, nasal congestion, sinus pressure and sore throat. ; ; Cardiovascular: Negative for chest pain, palpitations, diaphoresis, dyspnea and peripheral edema. ; ; Respiratory: Negative for cough, wheezing and stridor. ; ; Gastrointestinal: Negative for nausea, vomiting, diarrhea, abdominal pain, blood in stool, hematemesis, jaundice and rectal bleeding. . ; ; Genitourinary: +dysuria. Negative for flank pain and hematuria. ; ; Genital:  +penile drainage. No penile drainage or rash, no testicular pain or swelling, no scrotal rash or swelling.;;  Musculoskeletal: Negative for back pain and neck pain. Negative for swelling and trauma.; ; Skin: Negative for pruritus, rash, abrasions, blisters, bruising and skin lesion.; ; Neuro: Negative for headache, lightheadedness and neck stiffness. Negative for  weakness, altered level of consciousness , altered mental status, extremity weakness, paresthesias, involuntary movement, seizure and syncope.       Allergies  Bee venom and Poison oak extract  Home Medications   Current Outpatient Rx  Name  Route  Sig  Dispense  Refill  . hydrochlorothiazide (HYDRODIURIL) 25 MG tablet   Oral   Take 25 mg by mouth daily.         . Multiple Vitamin (MULTIVITAMIN) tablet   Oral   Take 1 tablet by mouth daily.           Marland Kitchen omeprazole (PRILOSEC) 20 MG capsule   Oral   Take 20 mg by mouth daily.          BP 132/83  Pulse 89  Temp(Src) 98 F (36.7 C) (Oral)  Resp 16  Ht 5\' 7"  (1.702 m)  Wt 200 lb (90.719 kg)  BMI 31.32 kg/m2  SpO2 98% Physical Exam 1500: Physical examination:  Nursing notes reviewed; Vital signs and O2 SAT reviewed;  Constitutional: Well developed, Well nourished, Well hydrated, In no acute distress; Head:  Normocephalic, atraumatic; Eyes: EOMI, PERRL, No scleral icterus; ENMT: Mouth and pharynx normal, Mucous membranes moist; Neck: Supple, Full range of motion, No lymphadenopathy; Cardiovascular: Regular rate and rhythm, No murmur, rub, or gallop; Respiratory: Breath sounds clear & equal bilaterally, No rales, rhonchi, wheezes.  Speaking full sentences with ease, Normal respiratory effort/excursion; Chest: Nontender, Movement normal; Abdomen: Soft, Nontender, Nondistended, Normal bowel sounds; Genitourinary: No CVA tenderness Genital exam performed with pt  permission and male ED RN chaperone present during exam. No perineal erythema.  No penile lesions or active drainage.  No scrotal erythema or edema.  Normal testicular lie.; Extremities: Pulses normal, No tenderness, No edema, No calf edema or asymmetry.; Neuro: AA&Ox3, Major CN grossly intact.  Speech clear. No gross focal motor or sensory deficits in extremities. Climbs on and off stretcher easily by himself. Gait steady.; Skin: Color normal, Warm, Dry.   ED Course   Procedures   EKG Interpretation   None       MDM  MDM Reviewed: previous chart, nursing note and vitals Reviewed previous: labs Interpretation: labs     Results for orders placed during the hospital encounter of 06/29/13  URINALYSIS W MICROSCOPIC + REFLEX CULTURE      Result Value Range   Color, Urine YELLOW  YELLOW   APPearance CLEAR  CLEAR   Specific Gravity, Urine >1.030 (*) 1.005 - 1.030   pH 6.0  5.0 - 8.0   Glucose, UA NEGATIVE  NEGATIVE mg/dL   Hgb urine dipstick NEGATIVE  NEGATIVE   Bilirubin Urine NEGATIVE  NEGATIVE   Ketones, ur NEGATIVE  NEGATIVE mg/dL   Protein, ur NEGATIVE  NEGATIVE mg/dL   Urobilinogen, UA 0.2  0.0 - 1.0 mg/dL   Nitrite NEGATIVE  NEGATIVE   Leukocytes, UA NEGATIVE  NEGATIVE    1525:  GC/chlam sent to lab. Will tx empirically at this time. Pt with multiple ED visits for same symptoms; strongly encouraged to have sexual partner(s) checked for STD's. Verb understanding. Dx and testing d/w pt.  Questions answered.  Verb understanding, agreeable to d/c home with outpt f/u.     Laray AngerKathleen M Nabor Thomann, DO 07/01/13 1936

## 2013-07-01 LAB — GC/CHLAMYDIA PROBE AMP
CT PROBE, AMP APTIMA: NEGATIVE
GC Probe RNA: NEGATIVE

## 2013-09-19 ENCOUNTER — Emergency Department (HOSPITAL_COMMUNITY)
Admission: EM | Admit: 2013-09-19 | Discharge: 2013-09-19 | Disposition: A | Discharge status: Home / Self Care | Payer: Self-pay | Attending: Emergency Medicine | Admitting: Emergency Medicine

## 2013-09-19 ENCOUNTER — Encounter (HOSPITAL_COMMUNITY): Payer: Self-pay | Admitting: Emergency Medicine

## 2013-09-19 ENCOUNTER — Emergency Department (HOSPITAL_COMMUNITY): Payer: Self-pay

## 2013-09-19 DIAGNOSIS — K219 Gastro-esophageal reflux disease without esophagitis: Secondary | ICD-10-CM | POA: Insufficient documentation

## 2013-09-19 DIAGNOSIS — M1990 Unspecified osteoarthritis, unspecified site: Secondary | ICD-10-CM

## 2013-09-19 DIAGNOSIS — Y929 Unspecified place or not applicable: Secondary | ICD-10-CM | POA: Insufficient documentation

## 2013-09-19 DIAGNOSIS — M1710 Unilateral primary osteoarthritis, unspecified knee: Secondary | ICD-10-CM | POA: Insufficient documentation

## 2013-09-19 DIAGNOSIS — Z87891 Personal history of nicotine dependence: Secondary | ICD-10-CM | POA: Insufficient documentation

## 2013-09-19 DIAGNOSIS — IMO0002 Reserved for concepts with insufficient information to code with codable children: Secondary | ICD-10-CM | POA: Insufficient documentation

## 2013-09-19 DIAGNOSIS — Z79899 Other long term (current) drug therapy: Secondary | ICD-10-CM | POA: Insufficient documentation

## 2013-09-19 DIAGNOSIS — Y939 Activity, unspecified: Secondary | ICD-10-CM | POA: Insufficient documentation

## 2013-09-19 DIAGNOSIS — W268XXA Contact with other sharp object(s), not elsewhere classified, initial encounter: Secondary | ICD-10-CM | POA: Insufficient documentation

## 2013-09-19 LAB — CBC WITH DIFFERENTIAL/PLATELET
BASOS ABS: 0 10*3/uL (ref 0.0–0.1)
BASOS PCT: 0 % (ref 0–1)
EOS ABS: 0.2 10*3/uL (ref 0.0–0.7)
Eosinophils Relative: 2 % (ref 0–5)
HEMATOCRIT: 36.8 % — AB (ref 39.0–52.0)
HEMOGLOBIN: 12.2 g/dL — AB (ref 13.0–17.0)
Lymphocytes Relative: 32 % (ref 12–46)
Lymphs Abs: 2.3 10*3/uL (ref 0.7–4.0)
MCH: 32 pg (ref 26.0–34.0)
MCHC: 33.2 g/dL (ref 30.0–36.0)
MCV: 96.6 fL (ref 78.0–100.0)
MONO ABS: 0.5 10*3/uL (ref 0.1–1.0)
MONOS PCT: 7 % (ref 3–12)
Neutro Abs: 4.2 10*3/uL (ref 1.7–7.7)
Neutrophils Relative %: 59 % (ref 43–77)
Platelets: 193 10*3/uL (ref 150–400)
RBC: 3.81 MIL/uL — ABNORMAL LOW (ref 4.22–5.81)
RDW: 12.5 % (ref 11.5–15.5)
WBC: 7.2 10*3/uL (ref 4.0–10.5)

## 2013-09-19 MED ORDER — NAPROXEN 500 MG PO TABS: 500.0000 mg | ORAL_TABLET | Freq: Two times a day (BID) | ORAL | Status: DC

## 2013-09-19 NOTE — Discharge Instructions (Signed)
Arthritis, Nonspecific Arthritis is pain, redness, warmth, or puffiness (inflammation) of a joint. The joint may be stiff or hurt when you move it. One or more joints may be affected. There are many types of arthritis. Your doctor may not know what type you have right away. The most common cause of arthritis is wear and tear on the joint (osteoarthritis). HOME CARE   Only take medicine as told by your doctor.  Rest the joint as much as possible.  Raise (elevate) your joint if it is puffy.  Use crutches if the painful joint is in your leg.  Drink enough fluids to keep your pee (urine) clear or pale yellow.  Follow your doctor's diet instructions.  Use cold packs for very bad joint pain for 10 to 15 minutes every hour. Ask your doctor if it is okay for you to use hot packs.  Exercise as told by your doctor.  Take a warm shower if you have stiffness in the morning.  Move your sore joints throughout the day. GET HELP RIGHT AWAY IF:   You have a fever.  You have very bad joint pain, puffiness, or redness.  You have many joints that are painful and puffy.  You are not getting better with treatment.  You have very bad back pain or leg weakness.  You cannot control when you poop (bowel movement) or pee (urinate).  You do not feel better in 24 hours or are getting worse.  You are having side effects from your medicine. MAKE SURE YOU:   Understand these instructions.  Will watch your condition.  Will get help right away if you are not doing well or get worse. Document Released: 08/10/2009 Document Revised: 11/15/2011 Document Reviewed: 08/10/2009 [facility] Patient Information 2014 [place], [place]. 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  It does not show any foreign body.  From your laceration.  Her white count is normal.  Place her on an anti-inflammatory and a regular basis, placed in a knee sleeve for support.  Please  make an appointment with your primary care physician for followup

## 2013-09-19 NOTE — ED Notes (Signed)
Pt thinks he cut the upper part of his knee on sheet metal on Mon/tues and cut through jeans and now has swelling to knee.  Pulse present.  Tetanus in last 2 years

## 2013-09-19 NOTE — ED Provider Notes (Signed)
CSN: [id number]     Arrival date & time 09/19/13  1833 History  This chart was scribed for non-physician practitioner, Earley FavorGail Luanne Krzyzanowski, FNP working with Shon Batonourtney F Horton, MD by Greggory StallionKayla Andersen, ED scribe. This patient was seen in room TR10C/TR10C and the patient's care was started at 7:16 PM.   Chief Complaint  Patient presents with  . Joint Swelling   The history is provided by the patient. No language interpreter was used.   HPI Comments: Philip Gonzalez is a 48 y.o. male who presents to the Emergency Department complaining of gradual onset, worsening left knee swelling and mild pain that started 3 days ago. He states he cut his knee on sheet metal 2-3 days ago. Pt has also had subjective fever. Certain movements worsen the pain. Pt's last tetanus was about 2 years ago. Denies history of chronic health problems.   Past Medical History  Diagnosis Date  . Acid reflux    History reviewed. No pertinent past surgical history. No family history on file. History  Substance Use Topics  . Smoking status: Former Smoker -- 1.00 packs/day    Types: Cigarettes  . Smokeless tobacco: Not on file  . Alcohol Use: Yes     Comment: occ    Review of Systems  Constitutional: Positive for fever.  Musculoskeletal: Positive for arthralgias and joint swelling. Negative for gait problem.  Skin: Positive for wound.  All other systems reviewed and are negative.  Allergies  Bee venom and Poison oak extract  Home Medications   Prior to Admission medications   Medication Sig Start Date End Date Taking? Authorizing Provider  hydrochlorothiazide (HYDRODIURIL) 25 MG tablet Take 25 mg by mouth daily.   Yes Historical Provider, MD  Multiple Vitamin (MULTIVITAMIN) tablet Take 1 tablet by mouth daily.     Yes Historical Provider, MD  omeprazole (PRILOSEC) 20 MG capsule Take 20 mg by mouth daily.   Yes Historical Provider, MD   BP 143/102  Pulse 81  Temp(Src) 98.2 F (36.8 C) (Oral)  Resp 18  Ht 5\' 7"  (1.702  m)  Wt 210 lb (95.255 kg)  BMI 32.88 kg/m2  SpO2 98%  Physical Exam  Nursing note and vitals reviewed. Constitutional: He is oriented to person, place, and time. He appears well-developed and well-nourished. No distress.  HENT:  Head: Normocephalic and atraumatic.  Eyes: EOM are normal.  Neck: Neck supple. No tracheal deviation present.  Cardiovascular: Normal rate.   Pulmonary/Chest: Effort normal. No respiratory distress.  Musculoskeletal: Normal range of motion. He exhibits edema and tenderness.  Slight swelling of superior aspect of right knee with a small abrasion to midline just anterior to patella. Tenderness and warm to touch. No redness noted. No distal swelling of leg.   Neurological: He is alert and oriented to person, place, and time.  Skin: Skin is warm and dry. No erythema.  Psychiatric: He has a normal mood and affect. His behavior is normal.    ED Course  Procedures (including critical care time)  DIAGNOSTIC STUDIES: Oxygen Saturation is 98% on RA, normal by my interpretation.    COORDINATION OF CARE: 7:17 PM-Discussed treatment plan which includes xray, CBC and an antibiotic with pt at bedside and pt agreed to plan.   Labs Review Labs Reviewed  CBC WITH DIFFERENTIAL - Abnormal; Notable for the following:    RBC 3.81 (*)    Hemoglobin 12.2 (*)    HCT 36.8 (*)    All other components within normal limits  I-STAT  CHEM 8, ED    Imaging Review Dg Knee Complete 4 Views Right  09/19/2013   CLINICAL DATA:  Trauma.  EXAM: RIGHT KNEE - COMPLETE 4+ VIEW  COMPARISON:  None.  FINDINGS: Tricompartment degenerative change noted. Knee joint effusion noted. No evidence of fracture or dislocation.  IMPRESSION: Tricompartment degenerative change. Knee joint effusion. No evidence of fracture or dislocation. No foreign body.   Electronically Signed   By: Maisie Fushomas  Register   On: 09/19/2013 20:35     EKG Interpretation None      MDM  Obtain CBC, as well as x-ray, most  likely will start patient on antibiotic and monitor.  He does have her primary care physician for follow Final diagnoses:  Arthritis       I personally performed the services described in this documentation, which was scribed in my presence. The recorded information has been reviewed and is accurate.  Arman FilterGail K Cyndi Montejano, NP 09/19/13 2100

## 2013-09-20 NOTE — ED Provider Notes (Signed)
Medical screening examination/treatment/procedure(s) were performed by non-physician practitioner and as supervising physician I was immediately available for consultation/collaboration.   EKG Interpretation None       Courtney F Horton, MD 09/20/13 1435 

## 2013-11-21 ENCOUNTER — Encounter (HOSPITAL_COMMUNITY): Payer: Self-pay | Admitting: Emergency Medicine

## 2013-11-21 ENCOUNTER — Emergency Department (HOSPITAL_COMMUNITY)
Admission: EM | Admit: 2013-11-21 | Discharge: 2013-11-21 | Disposition: A | Discharge status: Home / Self Care | Payer: Self-pay | Attending: Emergency Medicine | Admitting: Emergency Medicine

## 2013-11-21 DIAGNOSIS — Z791 Long term (current) use of non-steroidal anti-inflammatories (NSAID): Secondary | ICD-10-CM | POA: Insufficient documentation

## 2013-11-21 DIAGNOSIS — Z87891 Personal history of nicotine dependence: Secondary | ICD-10-CM | POA: Insufficient documentation

## 2013-11-21 DIAGNOSIS — L255 Unspecified contact dermatitis due to plants, except food: Secondary | ICD-10-CM | POA: Insufficient documentation

## 2013-11-21 DIAGNOSIS — K219 Gastro-esophageal reflux disease without esophagitis: Secondary | ICD-10-CM | POA: Insufficient documentation

## 2013-11-21 DIAGNOSIS — Z79899 Other long term (current) drug therapy: Secondary | ICD-10-CM | POA: Insufficient documentation

## 2013-11-21 MED ORDER — DIPHENHYDRAMINE HCL 25 MG PO CAPS
25.0000 mg | ORAL_CAPSULE | Freq: Once | ORAL | Status: AC
  Administered 2013-11-21: 25 mg via ORAL
  Filled 2013-11-21: qty 1

## 2013-11-21 MED ORDER — PREDNISONE 50 MG PO TABS
60.0000 mg | ORAL_TABLET | Freq: Once | ORAL | Status: AC
  Administered 2013-11-21: 60 mg via ORAL
  Filled 2013-11-21 (×2): qty 1

## 2013-11-21 MED ORDER — DIPHENHYDRAMINE HCL 25 MG PO TABS: 25.0000 mg | ORAL_TABLET | ORAL | Status: DC | PRN

## 2013-11-21 MED ORDER — PREDNISONE 10 MG PO TABS: ORAL_TABLET | ORAL | Status: DC

## 2013-11-21 NOTE — Discharge Instructions (Signed)
Poison [facility] Poison oak is a rash caused by touching the leaves of the poison oak plant. You may have a rash with redness and itching. Sometimes, blisters appear and break open. Your eyes may get puffy (swollen). Poison oak often heals in 2 to 3 weeks without treatment.  HOME CARE  If you touch poison oak:  Wash your skin with soap and water right away. Wash under your fingernails. Do not rub the skin very hard.  Wash any clothes you were wearing.  Avoid poison oak in the future. Poison oak usually has 3 leaves on a stem.  Use medicines to help with itching as told by your doctor. Do not drive when you take this medicine.  Keep open sores dry, clean, and covered with a bandage and medicated cream, if needed.  Ask your doctor about medicine for children. GET HELP RIGHT AWAY IF:  You have open sores.  Redness spreads beyond the area of the rash.  There is yellowish white fluid (pus) coming from the rash.  Pain gets worse.  You have a temperature by mouth above 102 F (38.9 C), not controlled by medicine. MAKE SURE YOU:  Understand these instructions.  Will watch your condition.  Will get help right away if you are not doing well or get worse. Document Released: 06/18/2010 Document Revised: 08/08/2011 Document Reviewed: 06/18/2010 [facility] Patient Information 2015 [place], [place]LL[place]. This information is not intended to replace advice given to you by your health care provider. Make sure you discuss any questions you have with your health care provider.  Poison [employer] Poison ivy is a rash caused by touching the leaves of the poison ivy plant. The rash often shows up 48 hours later. You might just have bumps, redness, and itching. Sometimes, blisters appear and break open. Your eyes may get puffy (swollen). Poison ivy often heals in 2 to 3 weeks without treatment. HOME CARE  If you touch poison ivy:  Wash your skin with soap and water right away. Wash under your fingernails. Do not rub  the skin very hard.  Wash any clothes you were wearing.  Avoid poison ivy in the future. Poison ivy has 3 leaves on a stem.  Use medicine to help with itching as told by your doctor. Do not drive when you take this medicine.  Keep open sores dry, clean, and covered with a bandage and medicated cream, if needed.  Ask your doctor about medicine for children. GET HELP RIGHT AWAY IF:  You have open sores.  Redness spreads beyond the area of the rash.  There is yellowish white fluid (pus) coming from the rash.  Pain gets worse.  You have a temperature by mouth above 102 F (38.9 C), not controlled by medicine. MAKE SURE YOU:  Understand these instructions.  Will watch your condition.  Will get help right away if you are not doing well or get worse. Document Released: 06/18/2010 Document Revised: 08/08/2011 Document Reviewed: 06/18/2010 [facility] Patient Information 2015 [place], [place]LL[place]. This information is not intended to replace advice given to you by your health care provider. Make sure you discuss any questions you have with your health care provider.

## 2013-11-21 NOTE — ED Notes (Signed)
Rash all over that itches, has used hydrocortisone cream without relief

## 2013-11-21 NOTE — ED Provider Notes (Signed)
CSN: [id number]     Arrival date & time 11/21/13  1953 History   First MD Initiated Contact with Patient 11/21/13 2012     Chief Complaint  Patient presents with  . Rash     (Consider location/radiation/quality/duration/timing/severity/associated sxs/prior Treatment) Patient is a 48 y.o. male presenting with rash. The history is provided by the patient.  Rash Location:  Shoulder/arm, head/neck and leg Head/neck rash location:  L neck and R neck Shoulder/arm rash location:  L forearm and R forearm Leg rash location:  L lower leg and R lower leg Quality: burning, itchiness and redness   Quality: not blistering, not bruising, not painful, not peeling, not scaling, not swelling and not weeping   Severity:  Mild Onset quality:  Gradual Duration:  3 days Timing:  Constant Progression:  Spreading Chronicity:  New Context: plant contact and sun exposure   Relieved by:  Nothing Worsened by:  Heat Ineffective treatments: hydrocortisone cream. Associated symptoms: no abdominal pain, no diarrhea, no fatigue, no fever, no headaches, no hoarse voice, no induration, no joint pain, no myalgias, no nausea, no periorbital edema, no shortness of breath, no sore throat, no throat swelling, no tongue swelling, no URI, not vomiting and not wheezing     Past Medical History  Diagnosis Date  . Acid reflux    History reviewed. No pertinent past surgical history. History reviewed. No pertinent family history. History  Substance Use Topics  . Smoking status: Former Smoker -- 1.00 packs/day    Types: Cigarettes  . Smokeless tobacco: Not on file  . Alcohol Use: Yes     Comment: occ    Review of Systems  Constitutional: Negative for fever, activity change, appetite change and fatigue.  HENT: Negative for facial swelling, hoarse voice, sore throat and trouble swallowing.   Respiratory: Negative for cough, shortness of breath and wheezing.   Gastrointestinal: Negative for nausea, vomiting,  abdominal pain and diarrhea.  Musculoskeletal: Negative for arthralgias and myalgias.  Skin: Positive for rash.  Neurological: Negative for dizziness and headaches.  Hematological: Does not bruise/bleed easily.  All other systems reviewed and are negative.     Allergies  Bee venom and Poison oak extract  Home Medications   Prior to Admission medications   Medication Sig Start Date End Date Taking? Authorizing Provider  hydrochlorothiazide (HYDRODIURIL) 25 MG tablet Take 25 mg by mouth daily.    Historical Provider, MD  Multiple Vitamin (MULTIVITAMIN) tablet Take 1 tablet by mouth daily.      Historical Provider, MD  naproxen (NAPROSYN) 500 MG tablet Take 1 tablet (500 mg total) by mouth 2 (two) times daily. 09/19/13   Arman FilterGail K Schulz, NP  omeprazole (PRILOSEC) 20 MG capsule Take 20 mg by mouth daily.    Historical Provider, MD   BP 133/83  Pulse 81  Temp(Src) 97.5 F (36.4 C) (Oral)  Resp 18  Ht 5\' 7"  (1.702 m)  Wt 195 lb (88.451 kg)  BMI 30.53 kg/m2  SpO2 98% Physical Exam  Nursing note and vitals reviewed. Constitutional: He is oriented to person, place, and time. He appears well-developed and well-nourished. No distress.  HENT:  Head: Normocephalic and atraumatic.  Mouth/Throat: Uvula is midline, oropharynx is clear and moist and mucous membranes are normal. No uvula swelling.  Neck: Normal range of motion. Neck supple.  Cardiovascular: Normal rate, regular rhythm, normal heart sounds and intact distal pulses.   No murmur heard. Pulmonary/Chest: Effort normal and breath sounds normal. No respiratory distress. He has no  wheezes. He has no rales. He exhibits no tenderness.  Musculoskeletal: Normal range of motion. He exhibits no edema and no tenderness.  Lymphadenopathy:    He has no cervical adenopathy.  Neurological: He is alert and oriented to person, place, and time. He exhibits normal muscle tone. Coordination normal.  Skin: Skin is warm. Rash noted. There is erythema.   Slightly raised, erythematous papules to the bilateral forearms, lower legs and anterior neck.  No pustules, edema or petechia    ED Course  Procedures (including critical care time) Labs Review Labs Reviewed - No data to display  Imaging Review No results found.   EKG Interpretation None      MDM   Final diagnoses:  None   Patient is well appearing. Vital signs are stable. Airway is patent without edema. Rash appears consistent with plant dermatitis. Patient agrees to symptomatic treatment with Benadryl and prednisone taper. He agrees to return to the ER for any worsening symptoms he stable for discharge at the time.     Tammy L. Trisha Mangleriplett, PA-C 11/21/13 2101

## 2013-11-26 NOTE — ED Provider Notes (Signed)
Medical screening examination/treatment/procedure(s) were conducted as a shared visit with non-physician practitioner(s) and myself.  I personally evaluated the patient during the encounter.   EKG Interpretation None       Vanetta MuldersScott Lovetta Condie, MD 11/26/13 [phone]

## 2013-12-08 ENCOUNTER — Emergency Department (HOSPITAL_COMMUNITY)
Admission: EM | Admit: 2013-12-08 | Discharge: 2013-12-08 | Disposition: A | Discharge status: Home / Self Care | Payer: Self-pay | Attending: Emergency Medicine | Admitting: Emergency Medicine

## 2013-12-08 ENCOUNTER — Encounter (HOSPITAL_COMMUNITY): Payer: Self-pay | Admitting: Emergency Medicine

## 2013-12-08 DIAGNOSIS — Z79899 Other long term (current) drug therapy: Secondary | ICD-10-CM | POA: Insufficient documentation

## 2013-12-08 DIAGNOSIS — A6000 Herpesviral infection of urogenital system, unspecified: Secondary | ICD-10-CM

## 2013-12-08 DIAGNOSIS — Z791 Long term (current) use of non-steroidal anti-inflammatories (NSAID): Secondary | ICD-10-CM | POA: Insufficient documentation

## 2013-12-08 DIAGNOSIS — Z87891 Personal history of nicotine dependence: Secondary | ICD-10-CM | POA: Insufficient documentation

## 2013-12-08 DIAGNOSIS — A6001 Herpesviral infection of penis: Secondary | ICD-10-CM | POA: Insufficient documentation

## 2013-12-08 DIAGNOSIS — R300 Dysuria: Secondary | ICD-10-CM | POA: Insufficient documentation

## 2013-12-08 DIAGNOSIS — K219 Gastro-esophageal reflux disease without esophagitis: Secondary | ICD-10-CM | POA: Insufficient documentation

## 2013-12-08 DIAGNOSIS — IMO0002 Reserved for concepts with insufficient information to code with codable children: Secondary | ICD-10-CM | POA: Insufficient documentation

## 2013-12-08 LAB — URINALYSIS, ROUTINE W REFLEX MICROSCOPIC
BILIRUBIN URINE: NEGATIVE
Glucose, UA: NEGATIVE mg/dL
HGB URINE DIPSTICK: NEGATIVE
Ketones, ur: NEGATIVE mg/dL
Leukocytes, UA: NEGATIVE
Nitrite: NEGATIVE
PROTEIN: NEGATIVE mg/dL
Specific Gravity, Urine: 1.02 (ref 1.005–1.030)
UROBILINOGEN UA: 0.2 mg/dL (ref 0.0–1.0)
pH: 6 (ref 5.0–8.0)

## 2013-12-08 MED ORDER — CEFTRIAXONE SODIUM 250 MG IJ SOLR
250.0000 mg | Freq: Once | INTRAMUSCULAR | Status: AC
  Administered 2013-12-08: 250 mg via INTRAMUSCULAR
  Filled 2013-12-08: qty 250

## 2013-12-08 MED ORDER — AZITHROMYCIN 250 MG PO TABS
1000.0000 mg | ORAL_TABLET | Freq: Once | ORAL | Status: AC
  Administered 2013-12-08: 1000 mg via ORAL
  Filled 2013-12-08: qty 4

## 2013-12-08 MED ORDER — ACYCLOVIR 800 MG PO TABS
800.0000 mg | ORAL_TABLET | Freq: Once | ORAL | Status: AC
  Administered 2013-12-08: 800 mg via ORAL
  Filled 2013-12-08: qty 1

## 2013-12-08 MED ORDER — ACYCLOVIR 400 MG PO TABS: 400.0000 mg | ORAL_TABLET | Freq: Four times a day (QID) | ORAL | Status: DC

## 2013-12-08 MED ORDER — LIDOCAINE HCL (PF) 1 % IJ SOLN
INTRAMUSCULAR | Status: AC
  Administered 2013-12-08: 2.1 mL
  Filled 2013-12-08: qty 5

## 2013-12-08 MED ORDER — ONDANSETRON HCL 4 MG PO TABS
4.0000 mg | ORAL_TABLET | Freq: Once | ORAL | Status: AC
  Administered 2013-12-08: 4 mg via ORAL
  Filled 2013-12-08: qty 1

## 2013-12-08 NOTE — ED Provider Notes (Signed)
CSN: [id number]     Arrival date & time 12/08/13  1407 History   First MD Initiated Contact with Patient 12/08/13 1445    This chart was scribed for non-physician practitioner working with Benny LennertJoseph L Zammit, MD, by Andrew Auaven Small, ED Scribe. This patient was seen in room APFT21/APFT21 and the patient's care was started at 3:22 PM. Chief Complaint  Patient presents with  . Rash  . SEXUALLY TRANSMITTED DISEASE   Patient is a 48 y.o. male presenting with rash. The history is provided by the patient. No language interpreter was used.  Rash Chronicity:  New Associated symptoms: fever    Philip FittingOscar G Gonzalez is a 48 y.o. male who presents to the Emergency Department complaining of difficulty urinating with associated fever, dysuria and penile discharge for the past 2 weeks. Pt denies known injury to kidney, bladder or genital area. Pt states he has been taking prednisone due to poison oak for the past 3 weeks. Pt believes the rash has spread to his genital area. Pt reports taking blood pressure pill once a day. Pt denies hematuria.   Past Medical History  Diagnosis Date  . Acid reflux    History reviewed. No pertinent past surgical history. History reviewed. No pertinent family history. History  Substance Use Topics  . Smoking status: Former Smoker -- 1.00 packs/day for 14 years    Types: Cigarettes    Quit date: 08/28/2013  . Smokeless tobacco: Never Used  . Alcohol Use: Yes     Comment: occ    Review of Systems  Constitutional: Positive for fever.  Genitourinary: Positive for dysuria, discharge and difficulty urinating. Negative for hematuria.  Skin: Positive for rash.  All other systems reviewed and are negative.     Allergies  Bee venom and Poison oak extract  Home Medications   Prior to Admission medications   Medication Sig Start Date End Date Taking? Authorizing Provider  diphenhydrAMINE (BENADRYL) 25 MG tablet Take 1 tablet (25 mg total) by mouth every 4 (four) hours as needed  for itching. 11/21/13   Tammy L. Triplett, PA-C  hydrochlorothiazide (HYDRODIURIL) 25 MG tablet Take 25 mg by mouth daily.    Historical Provider, MD  Multiple Vitamin (MULTIVITAMIN) tablet Take 1 tablet by mouth daily.      Historical Provider, MD  naproxen (NAPROSYN) 500 MG tablet Take 1 tablet (500 mg total) by mouth 2 (two) times daily. 09/19/13   Arman FilterGail K Schulz, NP  omeprazole (PRILOSEC) 20 MG capsule Take 20 mg by mouth daily.    Historical Provider, MD  predniSONE (DELTASONE) 10 MG tablet Take 6 tablets day one, 5 tablets day two, 4 tablets day three, 3 tablets day four, 2 tablets day five, then 1 tablet day six 11/21/13   Tammy L. Triplett, PA-C   BP 132/91  Pulse 81  Temp(Src) 97.6 F (36.4 C) (Oral)  Resp 18  Ht 5\' 7"  (1.702 m)  Wt 185 lb (83.915 kg)  BMI 28.97 kg/m2  SpO2 100% Physical Exam  Nursing note and vitals reviewed. Constitutional: He is oriented to person, place, and time. He appears well-developed and well-nourished. No distress.  HENT:  Head: Normocephalic and atraumatic.  Eyes: Conjunctivae and EOM are normal.  Neck: Neck supple.  Cardiovascular: Normal rate.   Pulmonary/Chest: Effort normal.  Genitourinary:  There is a blister lesion behind head of penis. 2 dry lesion of shaft of penis. No tenderness of the testicles. No inguinal nodes palpated. No active discharge from urethra   Musculoskeletal:  Normal range of motion.  Neurological: He is alert and oriented to person, place, and time.  Skin: Skin is warm and dry.  Psychiatric: He has a normal mood and affect. His behavior is normal.    ED Course  Procedures  DIAGNOSTIC STUDIES: Oxygen Saturation is 100% on RA, normal by my interpretation.    COORDINATION OF CARE: 3:30 PM- Pt advised of plan for treatment including and pt agrees.  Labs Review Labs Reviewed - No data to display  Imaging Review No results found.   EKG Interpretation None      MDM The blisters and dried lesions of the penis  suggest a possible herpes pattern. Patient given prescription for acyclovir. Patient is to follow with his primary physicians. Urethral culture sent to the lab.    Final diagnoses:  None    I personally performed the services described in this documentation, which was scribed in my presence. The recorded information has been reviewed and is accurate.     Kathie Dike, PA-C 12/09/13 1740

## 2013-12-08 NOTE — Discharge Instructions (Signed)
Please use the acyclovir 4 times daily. You were treated in the emergency department with Zithromax and Rocephin. Your culture results will be called to you in 3-4 days. Genital Herpes Genital herpes is a sexually transmitted disease. This means that it is a disease passed by having sex with an infected person. There is no cure for genital herpes. The time between attacks can be months to years. The virus may live in a person but produce no problems (symptoms). This infection can be passed to a baby as it travels down the birth canal (vagina). In a newborn, this can cause central nervous system damage, eye damage, or even death. The virus that causes genital herpes is usually HSV-2 virus. The virus that causes oral herpes is usually HSV-1. The diagnosis (learning what is wrong) is made through culture results. SYMPTOMS  Usually symptoms of pain and itching begin a few days to a week after contact. It first appears as small blisters that progress to small painful ulcers which then scab over and heal after several days. It affects the outer genitalia, birth canal, cervix, penis, anal area, buttocks, and thighs. HOME CARE INSTRUCTIONS   Keep ulcerated areas dry and clean.  Take medications as directed. Antiviral medications can speed up healing. They will not prevent recurrences or cure this infection. These medications can also be taken for suppression if there are frequent recurrences.  While the infection is active, it is contagious. Avoid all sexual contact during active infections.  Condoms may help prevent spread of the herpes virus.  Practice safe sex.  Wash your hands thoroughly after touching the genital area.  Avoid touching your eyes after touching your genital area.  Inform your caregiver if you have had genital herpes and become pregnant. It is your responsibility to insure a safe outcome for your baby in this pregnancy.  Only take over-the-counter or prescription medicines for pain,  discomfort, or fever as directed by your caregiver. SEEK MEDICAL CARE IF:   You have a recurrence of this infection.  You do not respond to medications and are not improving.  You have new sources of pain or discharge which have changed from the original infection.  You have an oral temperature above 102 F (38.9 C).  You develop abdominal pain.  You develop eye pain or signs of eye infection. Document Released: 05/13/2000 Document Revised: 08/08/2011 Document Reviewed: 06/03/2009 [facility] Patient Information 2015 [place], [place]. This information is not intended to replace advice given to you by your health care provider. Make sure you discuss any questions you have with your health care provider.

## 2013-12-08 NOTE — ED Notes (Addendum)
Patient c/o rash, itching, and swelling in genitals. Patient reports white discharge with odor.  Patient now states that he has been unable to void since last night. Per patient seen here 3 weeks ago for rash on arms and legs and diagnosed with poison oak, given prednisone with no relief.

## 2013-12-08 NOTE — ED Notes (Signed)
Patient with no complaints at this time. Respirations even and unlabored. Skin warm/dry. Discharge instructions reviewed with patient at this time. Patient given opportunity to voice concerns/ask questions. Patient discharged at this time and left Emergency Department with steady gait.   

## 2013-12-09 LAB — GC/CHLAMYDIA PROBE AMP
CT Probe RNA: NEGATIVE
GC Probe RNA: NEGATIVE

## 2013-12-10 NOTE — ED Provider Notes (Signed)
Medical screening examination/treatment/procedure(s) were performed by non-physician practitioner and as supervising physician I was immediately available for consultation/collaboration.   EKG Interpretation None        Benny LennertJoseph L Lucille Crichlow, MD 12/10/13 [phone]

## 2014-02-11 ENCOUNTER — Emergency Department (HOSPITAL_COMMUNITY)
Admission: EM | Admit: 2014-02-11 | Discharge: 2014-02-11 | Disposition: A | Discharge status: Home / Self Care | Payer: Self-pay | Attending: Emergency Medicine | Admitting: Emergency Medicine

## 2014-02-11 ENCOUNTER — Encounter (HOSPITAL_COMMUNITY): Payer: Self-pay | Admitting: Emergency Medicine

## 2014-02-11 DIAGNOSIS — A64 Unspecified sexually transmitted disease: Secondary | ICD-10-CM | POA: Insufficient documentation

## 2014-02-11 DIAGNOSIS — R300 Dysuria: Secondary | ICD-10-CM | POA: Insufficient documentation

## 2014-02-11 DIAGNOSIS — Z87891 Personal history of nicotine dependence: Secondary | ICD-10-CM | POA: Insufficient documentation

## 2014-02-11 DIAGNOSIS — Z79899 Other long term (current) drug therapy: Secondary | ICD-10-CM | POA: Insufficient documentation

## 2014-02-11 DIAGNOSIS — K219 Gastro-esophageal reflux disease without esophagitis: Secondary | ICD-10-CM | POA: Insufficient documentation

## 2014-02-11 LAB — URINALYSIS, ROUTINE W REFLEX MICROSCOPIC
BILIRUBIN URINE: NEGATIVE
GLUCOSE, UA: NEGATIVE mg/dL
HGB URINE DIPSTICK: NEGATIVE
Ketones, ur: NEGATIVE mg/dL
Leukocytes, UA: NEGATIVE
Nitrite: NEGATIVE
PH: 6 (ref 5.0–8.0)
Protein, ur: NEGATIVE mg/dL
Specific Gravity, Urine: 1.025 (ref 1.005–1.030)
Urobilinogen, UA: 0.2 mg/dL (ref 0.0–1.0)

## 2014-02-11 MED ORDER — AZITHROMYCIN 250 MG PO TABS
1000.0000 mg | ORAL_TABLET | Freq: Once | ORAL | Status: AC
  Administered 2014-02-11: 1000 mg via ORAL
  Filled 2014-02-11: qty 4

## 2014-02-11 MED ORDER — LIDOCAINE HCL (PF) 1 % IJ SOLN
INTRAMUSCULAR | Status: AC
  Filled 2014-02-11: qty 5

## 2014-02-11 MED ORDER — CEFTRIAXONE SODIUM 250 MG IJ SOLR
250.0000 mg | Freq: Once | INTRAMUSCULAR | Status: AC
  Administered 2014-02-11: 250 mg via INTRAMUSCULAR
  Filled 2014-02-11: qty 250

## 2014-02-11 NOTE — ED Notes (Signed)
PT c/o white milky discharge and pain from penis x3 days after urination.

## 2014-02-11 NOTE — Discharge Instructions (Signed)
Follow up with your md next week. °

## 2014-02-11 NOTE — ED Provider Notes (Signed)
CSN: [id number]     Arrival date & time 02/11/14  1724 History   First MD Initiated Contact with Patient 02/11/14 1830     Chief Complaint  Patient presents with  . SEXUALLY TRANSMITTED DISEASE     (Consider location/radiation/quality/duration/timing/severity/associated sxs/prior Treatment) Patient is a 48 y.o. male presenting with dysuria. The history is provided by the patient (the pt complains of dysuria and discharge from penis).  Dysuria This is a new problem. The current episode started yesterday. The problem occurs constantly. The problem has not changed since onset.Pertinent negatives include no chest pain, no abdominal pain and no headaches. Nothing aggravates the symptoms. Nothing relieves the symptoms.    Past Medical History  Diagnosis Date  . Acid reflux    History reviewed. No pertinent past surgical history. No family history on file. History  Substance Use Topics  . Smoking status: Former Smoker -- 1.00 packs/day for 14 years    Types: Cigarettes    Quit date: 08/28/2013  . Smokeless tobacco: Never Used  . Alcohol Use: Yes     Comment: occ    Review of Systems  Constitutional: Negative for appetite change and fatigue.  HENT: Negative for congestion, ear discharge and sinus pressure.   Eyes: Negative for discharge.  Respiratory: Negative for cough.   Cardiovascular: Negative for chest pain.  Gastrointestinal: Negative for abdominal pain and diarrhea.  Genitourinary: Positive for dysuria and discharge. Negative for frequency and hematuria.  Musculoskeletal: Negative for back pain.  Skin: Negative for rash.  Neurological: Negative for seizures and headaches.  Psychiatric/Behavioral: Negative for hallucinations.      Allergies  Bee venom and Poison oak extract  Home Medications   Prior to Admission medications   Medication Sig Start Date End Date Taking? Authorizing Provider  hydrochlorothiazide (HYDRODIURIL) 25 MG tablet Take 25 mg by mouth daily.    Yes Historical Provider, MD  Multiple Vitamin (MULTIVITAMIN) tablet Take 1 tablet by mouth daily.     Yes Historical Provider, MD  omeprazole (PRILOSEC) 20 MG capsule Take 20 mg by mouth daily.   Yes Historical Provider, MD   BP 131/82  Pulse 72  Temp(Src) 98.1 F (36.7 C) (Axillary)  Resp 18  Ht  (1.702 m)  Wt 190 lb (86.183 kg)  BMI 29.75 kg/m2  SpO2 100% Physical Exam  Constitutional: He is oriented to person, place, and time. He appears well-developed.  HENT:  Head: Normocephalic.  Eyes: Conjunctivae and EOM are normal. No scleral icterus.  Neck: Neck supple. No thyromegaly present.  Cardiovascular: Normal rate and regular rhythm.  Exam reveals no gallop and no friction rub.   No murmur heard. Pulmonary/Chest: No stridor. He has no wheezes. He has no rales. He exhibits no tenderness.  Abdominal: He exhibits no distension. There is no tenderness. There is no rebound.  Musculoskeletal: Normal range of motion. He exhibits no edema.  Lymphadenopathy:    He has no cervical adenopathy.  Neurological: He is oriented to person, place, and time. He exhibits normal muscle tone. Coordination normal.  Skin: No rash noted. No erythema.  Psychiatric: He has a normal mood and affect. His behavior is normal.    ED Course  Procedures (including critical care time) Labs Review Labs Reviewed  GC/CHLAMYDIA PROBE AMP  URINALYSIS, ROUTINE W REFLEX MICROSCOPIC    Imaging Review No results found.   EKG Interpretation None      MDM   Final diagnoses:  STD (male)   Urethritis.  Pt tx with zithromax  and rocephin   The chart was scribed for me under my direct supervision.  I personally performed the history, physical, and medical decision making and all procedures in the evaluation of this patient.Benny Lennert, MD 02/11/14 [phone]

## 2014-02-12 LAB — GC/CHLAMYDIA PROBE AMP
CT Probe RNA: NEGATIVE
GC PROBE AMP APTIMA: NEGATIVE

## 2014-04-15 ENCOUNTER — Emergency Department (HOSPITAL_COMMUNITY)
Admission: EM | Admit: 2014-04-15 | Discharge: 2014-04-15 | Disposition: A | Discharge status: Home / Self Care | Payer: Self-pay | Attending: Emergency Medicine | Admitting: Emergency Medicine

## 2014-04-15 ENCOUNTER — Encounter (HOSPITAL_COMMUNITY): Payer: Self-pay | Admitting: *Deleted

## 2014-04-15 DIAGNOSIS — Z87891 Personal history of nicotine dependence: Secondary | ICD-10-CM | POA: Insufficient documentation

## 2014-04-15 DIAGNOSIS — Z79899 Other long term (current) drug therapy: Secondary | ICD-10-CM | POA: Insufficient documentation

## 2014-04-15 DIAGNOSIS — K219 Gastro-esophageal reflux disease without esophagitis: Secondary | ICD-10-CM | POA: Insufficient documentation

## 2014-04-15 DIAGNOSIS — Z8619 Personal history of other infectious and parasitic diseases: Secondary | ICD-10-CM | POA: Insufficient documentation

## 2014-04-15 DIAGNOSIS — R369 Urethral discharge, unspecified: Secondary | ICD-10-CM | POA: Insufficient documentation

## 2014-04-15 HISTORY — DX: Unspecified sexually transmitted disease: A64

## 2014-04-15 LAB — URINALYSIS, ROUTINE W REFLEX MICROSCOPIC
Bilirubin Urine: NEGATIVE
Glucose, UA: NEGATIVE mg/dL
Hgb urine dipstick: NEGATIVE
KETONES UR: NEGATIVE mg/dL
LEUKOCYTES UA: NEGATIVE
Nitrite: NEGATIVE
PH: 7.5 (ref 5.0–8.0)
Protein, ur: NEGATIVE mg/dL
SPECIFIC GRAVITY, URINE: 1.021 (ref 1.005–1.030)
Urobilinogen, UA: 1 mg/dL (ref 0.0–1.0)

## 2014-04-15 LAB — HIV ANTIBODY (ROUTINE TESTING W REFLEX): HIV 1&2 Ab, 4th Generation: NONREACTIVE

## 2014-04-15 LAB — RPR

## 2014-04-15 MED ORDER — LIDOCAINE HCL (PF) 1 % IJ SOLN
0.9000 mL | Freq: Once | INTRAMUSCULAR | Status: AC
  Administered 2014-04-15: 0.9 mL
  Filled 2014-04-15: qty 5

## 2014-04-15 MED ORDER — CEFTRIAXONE SODIUM 250 MG IJ SOLR
250.0000 mg | Freq: Once | INTRAMUSCULAR | Status: AC
  Administered 2014-04-15: 250 mg via INTRAMUSCULAR
  Filled 2014-04-15: qty 250

## 2014-04-15 MED ORDER — AZITHROMYCIN 250 MG PO TABS
1000.0000 mg | ORAL_TABLET | Freq: Once | ORAL | Status: AC
  Administered 2014-04-15: 1000 mg via ORAL
  Filled 2014-04-15: qty 4

## 2014-04-15 NOTE — Discharge Instructions (Signed)

## 2014-04-15 NOTE — ED Provider Notes (Signed)
CSN: [id number]     Arrival date & time 04/15/14  [id number] History   First MD Initiated Contact with Patient 04/15/14 [phone]     Chief Complaint  Patient presents with  . Dysuria     (Consider location/radiation/quality/duration/timing/severity/associated sxs/prior Treatment) HPI  Patient to the ER with complaints of penile discharge and some mild dysuria. He has history of STD with visits for related symptoms on 07/17/2012, 10/29/2012, 06/29/2013, 12/08/2013, and 02/11/2014. He admits to having unprotected sex and has not been checked for HIV or syphilis in over a year. He has not had fevers, nausea, vomiting, diarrhea, abdominal pain, wounds, rashes, joint pains, testicular swelling or pain, penile pain.    Past Medical History  Diagnosis Date  . Acid reflux   . STD (male)    History reviewed. No pertinent past surgical history. History reviewed. No pertinent family history. History  Substance Use Topics  . Smoking status: Former Smoker -- 1.00 packs/day for 14 years    Types: Cigarettes    Quit date: 08/28/2013  . Smokeless tobacco: Never Used  . Alcohol Use: No     Comment: occ    Review of Systems  10 Systems reviewed and are negative for acute change except as noted in the HPI.     Allergies  Bee venom and Poison oak extract  Home Medications   Prior to Admission medications   Medication Sig Start Date End Date Taking? Authorizing Provider  hydrochlorothiazide (HYDRODIURIL) 25 MG tablet Take 25 mg by mouth daily.   Yes Historical Provider, MD  Multiple Vitamin (MULTIVITAMIN) tablet Take 1 tablet by mouth daily.     Yes Historical Provider, MD  omeprazole (PRILOSEC) 20 MG capsule Take 20 mg by mouth daily.   Yes Historical Provider, MD   BP 134/79 mmHg  Pulse 69  Temp(Src) 97.5 F (36.4 C) (Oral)  Resp 20  SpO2 99% Physical Exam  Constitutional: He appears well-developed and well-nourished. No distress.  HENT:  Head: Normocephalic and atraumatic.  Eyes: Pupils  are equal, round, and reactive to light.  Neck: Normal range of motion. Neck supple.  Cardiovascular: Normal rate and regular rhythm.   Pulmonary/Chest: Effort normal.  Abdominal: Soft.  Neurological: He is alert.  Skin: Skin is warm and dry.  Nursing note and vitals reviewed.   ED Course  Procedures (including critical care time) Labs Review Labs Reviewed  GC/CHLAMYDIA PROBE AMP  URINALYSIS, ROUTINE W REFLEX MICROSCOPIC  HIV ANTIBODY (ROUTINE TESTING)  RPR    Imaging Review No results found.   EKG Interpretation None      MDM   Final diagnoses:  Penile discharge    GC, UA, HIV and RPR pending.  Medications  cefTRIAXone (ROCEPHIN) injection 250 mg (250 mg Intramuscular Given 04/15/14 0931)  azithromycin (ZITHROMAX) tablet 1,000 mg (1,000 mg Oral Given 04/15/14 0929)  lidocaine (PF) (XYLOCAINE) 1 % injection 0.9 mL (0.9 mLs Other Given 04/15/14 0931)   Advised to practice safe sex and have all partners evaluated and treated at the local health department. Also advised to follow with the health Department in 1-2 weeks to confirm effectiveness of treatment and receive additional education/evaluation. Return precautions given.  48 y.o.Philip Modenascar G Kautzman's evaluation in the Emergency Department is complete. It has been determined that no acute conditions requiring further emergency intervention are present at this time. The patient/guardian have been advised of the diagnosis and plan. We have discussed signs and symptoms that warrant return to the ED, such as changes or worsening  in symptoms.  Vital signs are stable at discharge. Filed Vitals:   04/15/14 0900  BP: 134/79  Pulse: 69  Temp:   Resp:     Patient/guardian has voiced understanding and agreed to follow-up with the PCP or specialist.     Dorthula Matasiffany G Philip Cardosa, PA-C 04/15/14 [id number]  Richardean Canalavid H Yao, MD 04/17/14 [phone]

## 2014-04-15 NOTE — ED Notes (Signed)
NO ADVERSE REACTION TO INJECTION

## 2014-04-15 NOTE — ED Notes (Signed)
States onset penile discharge 1 week ago(white in color) then onset of slow urine stream and burning with urination, denies feeling febrile

## 2014-04-16 LAB — GC/CHLAMYDIA PROBE AMP
CT Probe RNA: NEGATIVE
GC PROBE AMP APTIMA: NEGATIVE

## 2014-09-17 ENCOUNTER — Emergency Department (HOSPITAL_COMMUNITY)
Admission: EM | Admit: 2014-09-17 | Discharge: 2014-09-17 | Discharge status: Against Medical Advice | Payer: BLUE CROSS/BLUE SHIELD | Attending: Emergency Medicine | Admitting: Emergency Medicine

## 2014-09-17 DIAGNOSIS — Z5321 Procedure and treatment not carried out due to patient leaving prior to being seen by health care provider: Secondary | ICD-10-CM | POA: Insufficient documentation

## 2014-09-17 NOTE — ED Notes (Signed)
Registration called triage and stated that pt left.

## 2014-09-19 ENCOUNTER — Encounter (HOSPITAL_COMMUNITY): Payer: Self-pay | Admitting: Emergency Medicine

## 2014-09-19 ENCOUNTER — Emergency Department (HOSPITAL_COMMUNITY)
Admission: EM | Admit: 2014-09-19 | Discharge: 2014-09-19 | Disposition: A | Discharge status: Home / Self Care | Payer: BLUE CROSS/BLUE SHIELD | Attending: Emergency Medicine | Admitting: Emergency Medicine

## 2014-09-19 DIAGNOSIS — Z79899 Other long term (current) drug therapy: Secondary | ICD-10-CM | POA: Insufficient documentation

## 2014-09-19 DIAGNOSIS — R1030 Lower abdominal pain, unspecified: Secondary | ICD-10-CM | POA: Diagnosis present

## 2014-09-19 DIAGNOSIS — R369 Urethral discharge, unspecified: Secondary | ICD-10-CM | POA: Insufficient documentation

## 2014-09-19 DIAGNOSIS — Z8619 Personal history of other infectious and parasitic diseases: Secondary | ICD-10-CM | POA: Insufficient documentation

## 2014-09-19 DIAGNOSIS — R300 Dysuria: Secondary | ICD-10-CM | POA: Diagnosis not present

## 2014-09-19 DIAGNOSIS — Z87891 Personal history of nicotine dependence: Secondary | ICD-10-CM | POA: Insufficient documentation

## 2014-09-19 DIAGNOSIS — K219 Gastro-esophageal reflux disease without esophagitis: Secondary | ICD-10-CM | POA: Diagnosis not present

## 2014-09-19 LAB — URINALYSIS, ROUTINE W REFLEX MICROSCOPIC
Bilirubin Urine: NEGATIVE
Glucose, UA: NEGATIVE mg/dL
Hgb urine dipstick: NEGATIVE
KETONES UR: NEGATIVE mg/dL
Leukocytes, UA: NEGATIVE
NITRITE: NEGATIVE
Protein, ur: NEGATIVE mg/dL
SPECIFIC GRAVITY, URINE: 1.02 (ref 1.005–1.030)
UROBILINOGEN UA: 0.2 mg/dL (ref 0.0–1.0)
pH: 6.5 (ref 5.0–8.0)

## 2014-09-19 MED ORDER — AZITHROMYCIN 250 MG PO TABS
1000.0000 mg | ORAL_TABLET | Freq: Once | ORAL | Status: AC
  Administered 2014-09-19: 1000 mg via ORAL
  Filled 2014-09-19: qty 4

## 2014-09-19 MED ORDER — LIDOCAINE HCL (PF) 1 % IJ SOLN
INTRAMUSCULAR | Status: AC
  Administered 2014-09-19: 5 mL
  Filled 2014-09-19: qty 5

## 2014-09-19 MED ORDER — CEFTRIAXONE SODIUM 250 MG IJ SOLR
250.0000 mg | Freq: Once | INTRAMUSCULAR | Status: AC
  Administered 2014-09-19: 250 mg via INTRAMUSCULAR
  Filled 2014-09-19: qty 250

## 2014-09-19 NOTE — Discharge Instructions (Signed)
Safe sexual practices. Have blood pressure rechecked by primary doctor. If you were given medicines take as directed.  If you are on coumadin or contraceptives realize their levels and effectiveness is altered by many different medicines.  If you have any reaction (rash, tongues swelling, other) to the medicines stop taking and see a physician.   Please follow up as directed and return to the ER or see a physician for new or worsening symptoms.  Thank you. Filed Vitals:   09/19/14 1054 09/19/14 1115 09/19/14 1130 09/19/14 1140  BP: 147/94   141/105  Pulse: 66 63 62   Temp: 98.2 F (36.8 C)     TempSrc: Oral     Resp: 18     Height: 5\' 7"  (1.702 m)     Weight: 190 lb (86.183 kg)     SpO2: 98% 100% 99%

## 2014-09-19 NOTE — ED Provider Notes (Signed)
CSN: [id number]     Arrival date & time 09/19/14  1046 History  This chart was scribed for Blane OharaJoshua Brookelin Felber, MD by Elveria Risingimelie Horne, ED scribe.  This patient was seen in room APA06/APA06 and the patient's care was started at 11:21 AM.   Chief Complaint  Patient presents with  . Flank Pain   Patient is a 49 y.o. male presenting with flank pain. The history is provided by the patient. No language interpreter was used.  Flank Pain This is a recurrent problem. Pertinent negatives include no abdominal pain.   HPI Comments: Janeece FittingOscar G Jessie is a 49 y.o. male who presents to the Emergency Department complaining of left flank pain, dysuria and white penile discharge for one week now. Patient denies sexual partners, reporting monogamous relationship. Patient reports pressure in his left flank with eating. Patient reports history of similar symptoms; his chart history reveals diagnosis of urethritis and multiple visits for the similar symtpoms.   Past Medical History  Diagnosis Date  . Acid reflux   . STD (male)    History reviewed. No pertinent past surgical history. History reviewed. No pertinent family history. History  Substance Use Topics  . Smoking status: Former Smoker -- 1.00 packs/day for 14 years    Types: Cigarettes    Quit date: 08/28/2013  . Smokeless tobacco: Never Used  . Alcohol Use: No     Comment: occ    Review of Systems  Constitutional: Negative for fever and chills.  Gastrointestinal: Negative for abdominal pain.  Genitourinary: Positive for dysuria, flank pain and discharge.  Skin: Negative for color change and rash.  All other systems reviewed and are negative.     Allergies  Bee venom and Poison oak extract  Home Medications   Prior to Admission medications   Medication Sig Start Date End Date Taking? Authorizing Provider  hydrochlorothiazide (HYDRODIURIL) 25 MG tablet Take 25 mg by mouth daily.   Yes Historical Provider, MD  Multiple Vitamin (MULTIVITAMIN)  tablet Take 1 tablet by mouth daily.     Yes Historical Provider, MD  omeprazole (PRILOSEC) 20 MG capsule Take 20 mg by mouth daily.   Yes Historical Provider, MD   Triage Vitals: BP 147/94 mmHg  Pulse 66  Temp(Src) 98.2 F (36.8 C) (Oral)  Resp 18  Ht 5\' 7"  (1.702 m)  Wt 190 lb (86.183 kg)  BMI 29.75 kg/m2  SpO2 98% Physical Exam  Constitutional: He is oriented to person, place, and time. He appears well-developed and well-nourished. No distress.  HENT:  Head: Normocephalic and atraumatic.  Eyes: EOM are normal.  Neck: Neck supple. No tracheal deviation present.  Cardiovascular: Normal rate.   Pulmonary/Chest: Effort normal. No respiratory distress.  Abdominal:  Mild discomfort to left flank. No rash.   Genitourinary:  No inguinal swelling. No discharge.  No focal testicle swelling or pain. No rash.   Musculoskeletal: Normal range of motion.  Neurological: He is alert and oriented to person, place, and time.  Skin: Skin is warm and dry.  Psychiatric: He has a normal mood and affect. His behavior is normal.  Nursing note and vitals reviewed.   ED Course  Procedures (including critical care time)  COORDINATION OF CARE: 11:51 AM- Discussed treatment plan with patient at bedside and patient agreed to plan.   Labs Review Labs Reviewed  URINE CULTURE  URINALYSIS, ROUTINE W REFLEX MICROSCOPIC    Imaging Review No results found.   EKG Interpretation None      MDM  Final diagnoses:  Penile discharge  Dysuria   Patient presents with urinary symptoms and penile discharge concern for STD. Discussed social discussion with sexual partner and outpatient follow-up. Prophylactic treatment with antibiotics. ` Abdominal exam benign no peritonitis.  Results and differential diagnosis were discussed with the patient/parent/guardian. Close follow up outpatient was discussed, comfortable with the plan.   Medications  cefTRIAXone (ROCEPHIN) injection 250 mg (250 mg  Intramuscular Given 09/19/14 1139)  azithromycin (ZITHROMAX) tablet 1,000 mg (1,000 mg Oral Given 09/19/14 1138)  lidocaine (PF) (XYLOCAINE) 1 % injection (5 mLs  Given 09/19/14 1139)    Filed Vitals:   09/19/14 1054 09/19/14 1115 09/19/14 1130 09/19/14 1140  BP: 147/94   141/105  Pulse: 66 63 62   Temp: 98.2 F (36.8 C)     TempSrc: Oral     Resp: 18     Height:  (1.702 m)     Weight: 190 lb (86.183 kg)     SpO2: 98% 100% 99%     Final diagnoses:  Penile discharge  Dysuria      Blane Ohara, MD 09/19/14 1152

## 2014-09-19 NOTE — ED Notes (Signed)
Pt reports L flank pain that started 1 week ago. Pt denies blood in his urine. Pt reports penile discharge.

## 2014-09-21 LAB — URINE CULTURE
Colony Count: NO GROWTH
Culture: NO GROWTH

## 2014-09-28 ENCOUNTER — Encounter (HOSPITAL_COMMUNITY): Payer: Self-pay | Admitting: *Deleted

## 2014-09-28 ENCOUNTER — Emergency Department (HOSPITAL_COMMUNITY)
Admission: EM | Admit: 2014-09-28 | Discharge: 2014-09-28 | Disposition: A | Discharge status: Home / Self Care | Payer: BLUE CROSS/BLUE SHIELD | Attending: Emergency Medicine | Admitting: Emergency Medicine

## 2014-09-28 DIAGNOSIS — N342 Other urethritis: Secondary | ICD-10-CM

## 2014-09-28 DIAGNOSIS — Z79899 Other long term (current) drug therapy: Secondary | ICD-10-CM | POA: Insufficient documentation

## 2014-09-28 DIAGNOSIS — Z8619 Personal history of other infectious and parasitic diseases: Secondary | ICD-10-CM | POA: Diagnosis not present

## 2014-09-28 DIAGNOSIS — Z87891 Personal history of nicotine dependence: Secondary | ICD-10-CM | POA: Diagnosis not present

## 2014-09-28 DIAGNOSIS — K219 Gastro-esophageal reflux disease without esophagitis: Secondary | ICD-10-CM | POA: Diagnosis not present

## 2014-09-28 DIAGNOSIS — R369 Urethral discharge, unspecified: Secondary | ICD-10-CM | POA: Diagnosis present

## 2014-09-28 LAB — URINALYSIS, ROUTINE W REFLEX MICROSCOPIC
BILIRUBIN URINE: NEGATIVE
GLUCOSE, UA: NEGATIVE mg/dL
HGB URINE DIPSTICK: NEGATIVE
Ketones, ur: NEGATIVE mg/dL
Leukocytes, UA: NEGATIVE
Nitrite: NEGATIVE
Protein, ur: NEGATIVE mg/dL
Urobilinogen, UA: 0.2 mg/dL (ref 0.0–1.0)
pH: 5.5 (ref 5.0–8.0)

## 2014-09-28 MED ORDER — CIPROFLOXACIN HCL 500 MG PO TABS: 500.0000 mg | ORAL_TABLET | Freq: Two times a day (BID) | ORAL | Status: DC

## 2014-09-28 MED ORDER — AZITHROMYCIN 1 G PO PACK
1.0000 g | PACK | Freq: Once | ORAL | Status: AC
  Administered 2014-09-28: 1 g via ORAL
  Filled 2014-09-28: qty 1

## 2014-09-28 MED ORDER — CEFTRIAXONE SODIUM 250 MG IJ SOLR
250.0000 mg | Freq: Once | INTRAMUSCULAR | Status: AC
Start: 2014-09-28 — End: 2014-09-28
  Administered 2014-09-28: 250 mg via INTRAMUSCULAR
  Filled 2014-09-28: qty 250

## 2014-09-28 MED ORDER — DOXYCYCLINE HYCLATE 100 MG PO CAPS: 100.0000 mg | ORAL_CAPSULE | Freq: Two times a day (BID) | ORAL | Status: DC

## 2014-09-28 MED ORDER — LIDOCAINE HCL (PF) 1 % IJ SOLN
INTRAMUSCULAR | Status: AC
  Administered 2014-09-28: 5 mL
  Filled 2014-09-28: qty 5

## 2014-09-28 NOTE — ED Provider Notes (Signed)
CSN: [id number]     Arrival date & time 09/28/14  0516 History   First MD Initiated Contact with Patient 09/28/14 0544     Chief Complaint  Patient presents with  . Dysuria  . Penile Discharge     (Consider location/radiation/quality/duration/timing/severity/associated sxs/prior Treatment) Patient is a 49 y.o. male presenting with dysuria and penile discharge. The history is provided by the patient.  Dysuria  Penile Discharge  He had onset yesterday of urethral discharge and dysuria. He denies abdominal pain or flank pain. He denies fever or chills. He had been treated for urethritis in the ED 9 days ago and symptoms did improve. He denies any sexual relations in the interim. He states he had not been able to contact his sexual partners.  Past Medical History  Diagnosis Date  . Acid reflux   . STD (male)    History reviewed. No pertinent past surgical history. No family history on file. History  Substance Use Topics  . Smoking status: Former Smoker -- 1.00 packs/day for 14 years    Types: Cigarettes    Quit date: 08/28/2013  . Smokeless tobacco: Never Used  . Alcohol Use: No     Comment: occ    Review of Systems  Genitourinary: Positive for dysuria and discharge.  All other systems reviewed and are negative.     Allergies  Bee venom and Poison oak extract  Home Medications   Prior to Admission medications   Medication Sig Start Date End Date Taking? Authorizing Provider  hydrochlorothiazide (HYDRODIURIL) 25 MG tablet Take 25 mg by mouth daily.   Yes Historical Provider, MD  Multiple Vitamin (MULTIVITAMIN) tablet Take 1 tablet by mouth daily.     Yes Historical Provider, MD  omeprazole (PRILOSEC) 20 MG capsule Take 20 mg by mouth daily.   Yes Historical Provider, MD   BP 131/98 mmHg  Pulse 78  Temp(Src) 97.5 F (36.4 C) (Oral)  Resp 20  Ht  (1.702 m)  Wt 190 lb (86.183 kg)  BMI 29.75 kg/m2  SpO2 98% Physical Exam  Nursing note and vitals  reviewed.  49 year old male, resting comfortably and in no acute distress. Vital signs are significant for hypertension. Oxygen saturation is 98%, which is normal. Head is normocephalic and atraumatic. PERRLA, EOMI. Oropharynx is clear. Neck is nontender and supple without adenopathy or JVD. Back is nontender and there is no CVA tenderness. Lungs are clear without rales, wheezes, or rhonchi. Chest is nontender. Heart has regular rate and rhythm without murmur. Abdomen is soft, flat, nontender without masses or hepatosplenomegaly and peristalsis is normoactive. Genitalia: Circumcised penis with no discharge seen. Testis descended without masses. No inguinal adenopathy. Extremities have no cyanosis or edema, full range of motion is present. Skin is warm and dry without rash. Neurologic: Mental status is normal, cranial nerves are intact, there are no motor or sensory deficits.  ED Course  Procedures (including critical care time) Labs Review Results for orders placed or performed during the hospital encounter of 09/28/14  Urinalysis, Routine w reflex microscopic  Result Value Ref Range   Color, Urine YELLOW YELLOW   APPearance CLEAR CLEAR   Specific Gravity, Urine >1.030 (H) 1.005 - 1.030   pH 5.5 5.0 - 8.0   Glucose, UA NEGATIVE NEGATIVE mg/dL   Hgb urine dipstick NEGATIVE NEGATIVE   Bilirubin Urine NEGATIVE NEGATIVE   Ketones, ur NEGATIVE NEGATIVE mg/dL   Protein, ur NEGATIVE NEGATIVE mg/dL   Urobilinogen, UA 0.2 0.0 - 1.0 mg/dL  Nitrite NEGATIVE NEGATIVE   Leukocytes, UA NEGATIVE NEGATIVE     MDM   Final diagnoses:  Urethritis    Recurrent urethritis. Old records are reviewed and he has multiple ED visits for urethritis and apparently is always responded to treatment for presumed gonorrhea or chlamydia. On several occasions, GC and Chlamydia cultures were obtained and were negative as have several RPR and HIV tests. Given his failure to respond to what should've been  curative doses of ceftriaxone and azithromycin, will treat him with those medications but also discharge on doxycycline and ciprofloxacin.    Dione Boozeavid Luria Rosario, MD 09/28/14 [phone]

## 2014-09-28 NOTE — Discharge Instructions (Signed)
Urethritis Urethritis is an inflammation of the tube through which urine exits your bladder (urethra).  CAUSES Urethritis is often caused by an infection in your urethra. The infection can be viral, like herpes. The infection can also be bacterial, like gonorrhea. RISK FACTORS Risk factors of urethritis include:  Having sex without using a condom.  Having multiple sexual partners.  Having poor hygiene. SIGNS AND SYMPTOMS Symptoms of urethritis are less noticeable in women than in men. These symptoms include:  Burning feeling when you urinate (dysuria).  Discharge from your urethra.  Blood in your urine (hematuria).  Urinating more than usual. DIAGNOSIS  To confirm a diagnosis of urethritis, your health care provider will do the following:  Ask about your sexual history.  Perform a physical exam.  Have you provide a sample of your urine for lab testing.  Use a cotton swab to gently collect a sample from your urethra for lab testing. TREATMENT  It is important to treat urethritis. Depending on the cause, untreated urethritis may lead to serious genital infections and possibly infertility. Urethritis caused by a bacterial infection is treated with antibiotic medicine. All sexual partners must be treated.  HOME CARE INSTRUCTIONS  Do not have sex until the test results are known and treatment is completed, even if your symptoms go away before you finish treatment.  If you were prescribed an antibiotic, finish it all even if you start to feel better. SEEK MEDICAL CARE IF:   Your symptoms are not improved in 3 days.  Your symptoms are getting worse.  You develop abdominal pain or pelvic pain (in women).  You develop joint pain.  You have a fever. SEEK IMMEDIATE MEDICAL CARE IF:   You have severe pain in the belly, back, or side.  You have repeated vomiting. MAKE SURE YOU:  Understand these instructions.  Will watch your condition.  Will get help right away if you  are not doing well or get worse. Document Released: 11/09/2000 Document Revised: 09/30/2013 Document Reviewed: 01/14/2013 [facility] Patient Information 2015 [place], [place]. This information is not intended to replace advice given to you by your health care provider. Make sure you discuss any questions you have with your health care provider.  Ciprofloxacin tablets What is this medicine? CIPROFLOXACIN (sip roe FLOX a sin) is a quinolone antibiotic. It is used to treat certain kinds of bacterial infections. It will not work for colds, flu, or other viral infections. This medicine may be used for other purposes; ask your health care provider or pharmacist if you have questions. COMMON BRAND NAME(S): Cipro What should I tell my health care provider before I take this medicine? They need to know if you have any of these conditions: -bone problems -cerebral disease -joint problems -irregular heartbeat -kidney disease -liver disease -myasthenia gravis -seizure disorder -tendon problems -an unusual or allergic reaction to ciprofloxacin, other antibiotics or medicines, foods, dyes, or preservatives -pregnant or trying to get pregnant -breast-feeding How should I use this medicine? Take this medicine by mouth with a glass of water. Follow the directions on the prescription label. Take your medicine at regular intervals. Do not take your medicine more often than directed. Take all of your medicine as directed even if you think your are better. Do not skip doses or stop your medicine early. You can take this medicine with food or on an empty stomach. It can be taken with a meal that contains dairy or calcium, but do not take it alone with a dairy product,  like milk or yogurt or calcium-fortified juice. A special MedGuide will be given to you by the pharmacist with each prescription and refill. Be sure to read this information carefully each time. Talk to your pediatrician regarding the use of this  medicine in children. Special care may be needed. Overdosage: If you think you have taken too much of this medicine contact a poison control center or emergency room at once. NOTE: This medicine is only for you. Do not share this medicine with others. What if I miss a dose? If you miss a dose, take it as soon as you can. If it is almost time for your next dose, take only that dose. Do not take double or extra doses. What may interact with this medicine? Do not take this medicine with any of the following medications: -cisapride -droperidol -terfenadine -tizanidine This medicine may also interact with the following medications: -antacids -birth control pills -caffeine -cyclosporin -didanosine (ddI) buffered tablets or powder -medicines for diabetes -medicines for inflammation like ibuprofen, naproxen -methotrexate -multivitamins -omeprazole -phenytoin -probenecid -sucralfate -theophylline -warfarin This list may not describe all possible interactions. Give your health care provider a list of all the medicines, herbs, non-prescription drugs, or dietary supplements you use. Also tell them if you smoke, drink alcohol, or use illegal drugs. Some items may interact with your medicine. What should I watch for while using this medicine? Tell your doctor or health care professional if your symptoms do not improve. Do not treat diarrhea with over the counter products. Contact your doctor if you have diarrhea that lasts more than 2 days or if it is severe and watery. You may get drowsy or dizzy. Do not drive, use machinery, or do anything that needs mental alertness until you know how this medicine affects you. Do not stand or sit up quickly, especially if you are an older patient. This reduces the risk of dizzy or fainting spells. This medicine can make you more sensitive to the sun. Keep out of the sun. If you cannot avoid being in the sun, wear protective clothing and use sunscreen. Do not  use sun lamps or tanning beds/booths. Avoid antacids, aluminum, calcium, iron, magnesium, and zinc products for 6 hours before and 2 hours after taking a dose of this medicine. What side effects may I notice from receiving this medicine? Side effects that you should report to your doctor or health care professional as soon as possible: - allergic reactions like skin rash, itching or hives, swelling of the face, lips, or tongue - breathing problems - confusion, nightmares or hallucinations - feeling faint or lightheaded, falls - irregular heartbeat - joint, muscle or tendon pain or swelling - pain or trouble passing urine -persistent headache with or without blurred vision - redness, blistering, peeling or loosening of the skin, including inside the mouth - seizure - unusual pain, numbness, tingling, or weakness Side effects that usually do not require medical attention (report to your doctor or health care professional if they continue or are bothersome): - diarrhea - nausea or stomach upset - white patches or sores in the mouth This list may not describe all possible side effects. Call your doctor for medical advice about side effects. You may report side effects to FDA at 1-800-FDA-1088. Where should I keep my medicine? Keep out of the reach of children. Store at room temperature below 30 degrees C (86 degrees F). Keep container tightly closed. Throw away any unused medicine after the expiration date. NOTE: This sheet is a  summary. It may not cover all possible information. If you have questions about this medicine, talk to your doctor, pharmacist, or health care provider.  2015, Elsevier/Gold Standard. (2012-12-20 16:10:46)  Doxycycline tablets or capsules What is this medicine? DOXYCYCLINE (dox i SYE kleen) is a tetracycline antibiotic. It kills certain bacteria or stops their growth. It is used to treat many kinds of infections, like dental, skin, respiratory, and  urinary tract infections. It also treats acne, Lyme disease, malaria, and certain sexually transmitted infections. This medicine may be used for other purposes; ask your health care provider or pharmacist if you have questions. COMMON BRAND NAME(S): Acticlate, Adoxa, Adoxa CK, Adoxa Pak, Adoxa TT, Alodox, Avidoxy, Doxal, Monodox, Morgidox 1x, Morgidox 1x Kit, Morgidox 2x, Morgidox 2x Kit, Ocudox, Vibra-Tabs, Vibramycin What should I tell my health care provider before I take this medicine? They need to know if you have any of these conditions: -liver disease -long exposure to sunlight like working outdoors -stomach problems like colitis -an unusual or allergic reaction to doxycycline, tetracycline antibiotics, other medicines, foods, dyes, or preservatives -pregnant or trying to get pregnant -breast-feeding How should I use this medicine? Take this medicine by mouth with a full glass of water. Follow the directions on the prescription label. It is best to take this medicine without food, but if it upsets your stomach take it with food. Take your medicine at regular intervals. Do not take your medicine more often than directed. Take all of your medicine as directed even if you think you are better. Do not skip doses or stop your medicine early. Talk to your pediatrician regarding the use of this medicine in children. Special care may be needed. While this drug may be prescribed for children as young as 60 years old for selected conditions, precautions do apply. Overdosage: If you think you have taken too much of this medicine contact a poison control center or emergency room at once. NOTE: This medicine is only for you. Do not share this medicine with others. What if I miss a dose? If you miss a dose, take it as soon as you can. If it is almost time for your next dose, take only that dose. Do not take double or extra doses. What may interact with this medicine? -antacids -barbiturates -birth  control pills -bismuth subsalicylate -carbamazepine -methoxyflurane -other antibiotics -phenytoin -vitamins that contain iron -warfarin This list may not describe all possible interactions. Give your health care provider a list of all the medicines, herbs, non-prescription drugs, or dietary supplements you use. Also tell them if you smoke, drink alcohol, or use illegal drugs. Some items may interact with your medicine. What should I watch for while using this medicine? Tell your doctor or health care professional if your symptoms do not improve. Do not treat diarrhea with over the counter products. Contact your doctor if you have diarrhea that lasts more than 2 days or if it is severe and watery. Do not take this medicine just before going to bed. It may not dissolve properly when you lay down and can cause pain in your throat. Drink plenty of fluids while taking this medicine to also help reduce irritation in your throat. This medicine can make you more sensitive to the sun. Keep out of the sun. If you cannot avoid being in the sun, wear protective clothing and use sunscreen. Do not use sun lamps or tanning beds/booths. Birth control pills may not work properly while you are taking this medicine. Talk to your  doctor about using an extra method of birth control. If you are being treated for a sexually transmitted infection, avoid sexual contact until you have finished your treatment. Your sexual partner may also need treatment. Avoid antacids, aluminum, calcium, magnesium, and iron products for 4 hours before and 2 hours after taking a dose of this medicine. If you are using this medicine to prevent malaria, you should still protect yourself from contact with mosquitos. Stay in screened-in areas, use mosquito nets, keep your body covered, and use an insect repellent. What side effects may I notice from receiving this medicine? Side effects that you should report to your doctor or health care  professional as soon as possible: -allergic reactions like skin rash, itching or hives, swelling of the face, lips, or tongue -difficulty breathing -fever -itching in the rectal or genital area -pain on swallowing -redness, blistering, peeling or loosening of the skin, including inside the mouth -severe stomach pain or cramps -unusual bleeding or bruising -unusually weak or tired -yellowing of the eyes or skin Side effects that usually do not require medical attention (report to your doctor or health care professional if they continue or are bothersome): -diarrhea -loss of appetite -nausea, vomiting This list may not describe all possible side effects. Call your doctor for medical advice about side effects. You may report side effects to FDA at 1-800-FDA-1088. Where should I keep my medicine? Keep out of the reach of children. Store at room temperature, below 30 degrees C (86 degrees F). Protect from light. Keep container tightly closed. Throw away any unused medicine after the expiration date. Taking this medicine after the expiration date can make you seriously ill. NOTE: This sheet is a summary. It may not cover all possible information. If you have questions about this medicine, talk to your doctor, pharmacist, or health care provider.  2015, Elsevier/Gold Standard. (2013-03-22 13:58:06)

## 2014-09-28 NOTE — ED Notes (Signed)
Pt reprots urinary symptoms & penial discharge that started Friday.

## 2014-09-29 LAB — GC/CHLAMYDIA PROBE AMP (~~LOC~~) NOT AT ARMC
CHLAMYDIA, DNA PROBE: NEGATIVE
NEISSERIA GONORRHEA: NEGATIVE

## 2014-10-23 ENCOUNTER — Encounter (HOSPITAL_COMMUNITY): Payer: Self-pay

## 2014-10-23 ENCOUNTER — Emergency Department (HOSPITAL_COMMUNITY)
Admission: EM | Admit: 2014-10-23 | Discharge: 2014-10-23 | Disposition: A | Discharge status: Home / Self Care | Payer: BLUE CROSS/BLUE SHIELD | Attending: Emergency Medicine | Admitting: Emergency Medicine

## 2014-10-23 DIAGNOSIS — Z8719 Personal history of other diseases of the digestive system: Secondary | ICD-10-CM | POA: Insufficient documentation

## 2014-10-23 DIAGNOSIS — R21 Rash and other nonspecific skin eruption: Secondary | ICD-10-CM

## 2014-10-23 DIAGNOSIS — Z87891 Personal history of nicotine dependence: Secondary | ICD-10-CM | POA: Diagnosis not present

## 2014-10-23 DIAGNOSIS — K625 Hemorrhage of anus and rectum: Secondary | ICD-10-CM

## 2014-10-23 DIAGNOSIS — Z792 Long term (current) use of antibiotics: Secondary | ICD-10-CM | POA: Diagnosis not present

## 2014-10-23 DIAGNOSIS — K648 Other hemorrhoids: Secondary | ICD-10-CM | POA: Insufficient documentation

## 2014-10-23 DIAGNOSIS — K649 Unspecified hemorrhoids: Secondary | ICD-10-CM

## 2014-10-23 MED ORDER — HYDROCORTISONE ACETATE 25 MG RE SUPP: 25.0000 mg | Freq: Two times a day (BID) | RECTAL | Status: DC

## 2014-10-23 MED ORDER — PREDNISONE 10 MG PO TABS: ORAL_TABLET | ORAL | Status: DC

## 2014-10-23 MED ORDER — HYDROCORTISONE 2.5 % RE CREA: TOPICAL_CREAM | RECTAL | Status: DC

## 2014-10-23 MED ORDER — LIDOCAINE VISCOUS 2 % MT SOLN
15.0000 mL | Freq: Once | OROMUCOSAL | Status: AC
  Administered 2014-10-23: 15 mL via OROMUCOSAL
  Filled 2014-10-23: qty 15

## 2014-10-23 NOTE — Discharge Instructions (Signed)
°  Hemorrhoids Hemorrhoids are puffy (swollen) veins around the rectum or anus. Hemorrhoids can cause pain, itching, bleeding, or irritation. HOME CARE  Eat foods with fiber, such as whole grains, beans, nuts, fruits, and vegetables. Ask your doctor about taking products with added fiber in them (fibersupplements).  Drink enough fluid to keep your pee (urine) clear or pale yellow.  Exercise often.  Go to the bathroom when you have the urge to poop. Do not wait.  Avoid straining to poop (bowel movement).  Keep the butt area dry and clean. Use wet toilet paper or moist paper towels.  Medicated creams and medicine inserted into the anus (anal suppository) may be used or applied as told.  Only take medicine as told by your doctor.  Take a warm water bath (sitz bath) for 15-20 minutes to ease pain. Do this 3-4 times a day.  Place ice packs on the area if it is tender or puffy. Use the ice packs between the warm water baths.  Put ice in a plastic bag.  Place a towel between your skin and the bag.  Leave the ice on for 15-20 minutes, 03-04 times a day.  Do not use a donut-shaped pillow or sit on the toilet for a long time. GET HELP RIGHT AWAY IF:   You have more pain that is not controlled by treatment or medicine.  You have bleeding that will not stop.  You have trouble or are unable to poop (bowel movement).  You have pain or puffiness outside the area of the hemorrhoids. MAKE SURE YOU:   Understand these instructions.  Will watch your condition.  Will get help right away if you are not doing well or get worse. Document Released: 02/23/2008 Document Revised: 05/02/2012 Document Reviewed: 03/27/2012 [facility] Patient Information 2015 [place]. This information is not intended to replace advice given to you by your health care provider. Make sure you discuss any questions you have with your health care provider.  Use the medicines as prescribed.  Also, you may apply  the lidocaine using a q tip externally which can help with the perianal irritation and pain - apply every 4 hours if needed.  Stop taking the antibiotics since your cultures obtained earlier this month are negative.  It is possible your rash and itching could be related to this antibiotic use. Take the prednisone to help with rash.

## 2014-10-23 NOTE — ED Notes (Signed)
PA Julie Idol at bedside. 

## 2014-10-23 NOTE — ED Provider Notes (Signed)
CSN: [id number]     Arrival date & time 10/23/14  1003 History   First MD Initiated Contact with Patient 10/23/14 1044     Chief Complaint  Patient presents with  . Hemorrhoids     (Consider location/radiation/quality/duration/timing/severity/associated sxs/prior Treatment) The history is provided by the patient.   Philip Gonzalez is a 49 y.o. male with a prior history of hemorrhoids with surgical intervention many years ago presenting with perirectal pain, but also itching, irritation and small traces of rectal bleeding with bowel movements only.  He was seen here earlier this month for urethritis and was placed on Cipro and doxycycline.  He has not been consistently taking these medications,  if compliant would have completed them on May 11.  He states his last dose of both medications were taken last night.  He also endorses generalized itching and areas of mild pruritic rash since been on these medicines.  He denies shortness of breath, fevers or chills.  Urethritis symptoms have resolved.       Past Medical History  Diagnosis Date  . Acid reflux   . STD (male)    History reviewed. No pertinent past surgical history. No family history on file. History  Substance Use Topics  . Smoking status: Former Smoker -- 1.00 packs/day for 14 years    Types: Cigarettes    Quit date: 08/28/2013  . Smokeless tobacco: Never Used  . Alcohol Use: No     Comment: occ    Review of Systems  Constitutional: Negative for fever.  HENT: Negative for congestion and sore throat.   Eyes: Negative.   Respiratory: Negative for chest tightness and shortness of breath.   Cardiovascular: Negative for chest pain.       Rectal pain and itching.  Gastrointestinal: Positive for anal bleeding. Negative for nausea and abdominal pain.  Genitourinary: Negative.   Musculoskeletal: Negative for joint swelling, arthralgias and neck pain.  Skin: Negative.  Negative for rash and wound.  Neurological: Negative for  dizziness, weakness, light-headedness, numbness and headaches.  Psychiatric/Behavioral: Negative.       Allergies  Bee venom and Poison oak extract  Home Medications   Prior to Admission medications   Medication Sig Start Date End Date Taking? Authorizing Provider  ciprofloxacin (CIPRO) 500 MG tablet Take 1 tablet (500 mg total) by mouth 2 (two) times daily. Patient not taking: Reported on 10/23/2014 09/28/14   Dione Booze, MD  doxycycline (VIBRAMYCIN) 100 MG capsule Take 1 capsule (100 mg total) by mouth 2 (two) times daily. Patient not taking: Reported on 10/23/2014 09/28/14   Dione Booze, MD  hydrocortisone (ANUSOL-HC) 2.5 % rectal cream Apply to peri rectal area 2 times daily 10/23/14   Burgess Amor, PA-C  hydrocortisone (ANUSOL-HC) 25 MG suppository Place 1 suppository (25 mg total) rectally 2 (two) times daily. 10/23/14   Burgess Amor, PA-C  predniSONE (DELTASONE) 10 MG tablet 6, 5, 4, 3, 2 then 1 tablet by mouth daily for 6 days total. 10/23/14   Burgess Amor, PA-C   BP 145/99 mmHg  Pulse 72  Temp(Src) 97.6 F (36.4 C) (Oral)  Resp 18  Ht  (1.676 m)  Wt 190 lb (86.183 kg)  BMI 30.68 kg/m2  SpO2 99% Physical Exam  Constitutional: He appears well-developed and well-nourished.  HENT:  Head: Normocephalic and atraumatic.  Eyes: Conjunctivae are normal.  Cardiovascular: Normal rate and regular rhythm.   Pulmonary/Chest: Effort normal and breath sounds normal.  Abdominal: Soft. Bowel sounds are normal. There  is no tenderness.  Genitourinary:  No external hemorrhoids appreciated.  External anal excoriations and irritation.  Small internal hemorrhoid present.  No gross blood on exam.  Musculoskeletal: Normal range of motion.  Neurological: He is alert.  Skin: Skin is warm and dry.  Scattered areas of fine sandpaper quality rash and excoriations, upper chest, back, abdomen.  Psychiatric: He has a normal mood and affect.  Nursing note and vitals reviewed.  Chaperone was present  during exam.  ED Course  Procedures (including critical care time) Labs Review Labs Reviewed - No data to display  Imaging Review No results found.   EKG Interpretation None      MDM   Final diagnoses:  Rectal bleeding  Hemorrhoids, unspecified hemorrhoid type  Rash    Prior labs reviewed, cultures negative. Pt advised to stop abx as may be source of rash and itching.  He was given anusol suppositories, cream for external use.  Prednisone for generalized rash. Advised f/u with pcp for recheck in one week if not improved.   Burgess AmorJulie Lorrena Goranson, PA-C 10/23/14 1731  Bethann BerkshireJoseph Zammit, MD 10/25/14 [phone]

## 2014-10-23 NOTE — ED Notes (Signed)
Pt reports has hemorrhoids and has had rectal bleeding when wiping and itching x 2 weeks.

## 2014-11-10 ENCOUNTER — Encounter (HOSPITAL_COMMUNITY): Payer: Self-pay | Admitting: Emergency Medicine

## 2014-11-10 ENCOUNTER — Emergency Department (HOSPITAL_COMMUNITY)
Admission: EM | Admit: 2014-11-10 | Discharge: 2014-11-10 | Disposition: A | Discharge status: Home / Self Care | Payer: BLUE CROSS/BLUE SHIELD | Attending: Emergency Medicine | Admitting: Emergency Medicine

## 2014-11-10 DIAGNOSIS — Z87891 Personal history of nicotine dependence: Secondary | ICD-10-CM | POA: Diagnosis not present

## 2014-11-10 DIAGNOSIS — Z7952 Long term (current) use of systemic steroids: Secondary | ICD-10-CM | POA: Insufficient documentation

## 2014-11-10 DIAGNOSIS — K6289 Other specified diseases of anus and rectum: Secondary | ICD-10-CM | POA: Insufficient documentation

## 2014-11-10 DIAGNOSIS — Z8719 Personal history of other diseases of the digestive system: Secondary | ICD-10-CM | POA: Insufficient documentation

## 2014-11-10 DIAGNOSIS — Z8619 Personal history of other infectious and parasitic diseases: Secondary | ICD-10-CM | POA: Diagnosis not present

## 2014-11-10 HISTORY — DX: Unspecified hemorrhoids: K64.9

## 2014-11-10 MED ORDER — ZINC OXIDE 40 % EX OINT: 1.0000 "application " | TOPICAL_OINTMENT | CUTANEOUS | Status: DC | PRN

## 2014-11-10 NOTE — ED Provider Notes (Signed)
CSN: [id number]     Arrival date & time 11/10/14  4098 History   First MD Initiated Contact with Patient 11/10/14 [phone]     Chief Complaint  Patient presents with  . Rectal Pain      The history is provided by the patient.   patient reports she's had ongoing itching and irritation around his perirectal area over the past month.  He has a history of hemorrhoids and was recently placed on Anusol and hydrocortisone cream without improvement.  Reports ongoing discomfort and itching.  He denies abdominal pain.  No fevers or chills.  No painful bowel movements.  No nausea or vomiting.  No change in his bowel habits.  He has not spoke with his primary care about this.  His symptoms are mild to moderate in severity.    Past Medical History  Diagnosis Date  . Acid reflux   . STD (male)   . Hemorrhoid    History reviewed. No pertinent past surgical history. History reviewed. No pertinent family history. History  Substance Use Topics  . Smoking status: Former Smoker -- 1.00 packs/day for 14 years    Types: Cigarettes    Quit date: 08/28/2013  . Smokeless tobacco: Never Used  . Alcohol Use: No     Comment: occ    Review of Systems  All other systems reviewed and are negative.     Allergies  Bee venom and Poison oak extract  Home Medications   Prior to Admission medications   Medication Sig Start Date End Date Taking? Authorizing Provider  ciprofloxacin (CIPRO) 500 MG tablet Take 1 tablet (500 mg total) by mouth 2 (two) times daily. Patient not taking: Reported on 10/23/2014 09/28/14   Dione Booze, MD  doxycycline (VIBRAMYCIN) 100 MG capsule Take 1 capsule (100 mg total) by mouth 2 (two) times daily. Patient not taking: Reported on 10/23/2014 09/28/14   Dione Booze, MD  hydrocortisone (ANUSOL-HC) 2.5 % rectal cream Apply to peri rectal area 2 times daily 10/23/14   Burgess Amor, PA-C  hydrocortisone (ANUSOL-HC) 25 MG suppository Place 1 suppository (25 mg total) rectally 2 (two) times  daily. 10/23/14   Burgess Amor, PA-C  liver oil-zinc oxide (DESITIN) 40 % ointment Apply 1 application topically as needed for irritation. 11/10/14   Azalia Bilis, MD  predniSONE (DELTASONE) 10 MG tablet 6, 5, 4, 3, 2 then 1 tablet by mouth daily for 6 days total. 10/23/14   Burgess Amor, PA-C   BP 126/91 mmHg  Pulse 76  Temp(Src) 97.6 F (36.4 C) (Oral)  Resp 16  Ht  (1.676 m)  Wt 190 lb (86.183 kg)  BMI 30.68 kg/m2  SpO2 97% Physical Exam  Constitutional: He is oriented to person, place, and time. He appears well-developed and well-nourished.  HENT:  Head: Normocephalic.  Eyes: EOM are normal.  Neck: Normal range of motion.  Pulmonary/Chest: Effort normal.  Abdominal: He exhibits no distension.  Genitourinary:  No external hemorrhoids noted.  No cellulitis.  Mild perirectal inflammation consistent with dermatitis.  No focal tenderness.  No fluctuance.  Musculoskeletal: Normal range of motion.  Neurological: He is alert and oriented to person, place, and time.  Psychiatric: He has a normal mood and affect.  Nursing note and vitals reviewed.   ED Course  Procedures (including critical care time) Labs Review Labs Reviewed - No data to display  Imaging Review No results found.   EKG Interpretation None      MDM   Final diagnoses:  Perirectal inflammation    Likely dermatitis.  We'll place on petroleum jelly or Desitin.  No signs of external hemorrhoids.  Doubt deep space infection.  Vital signs normal.    Azalia Bilis, MD 11/10/14 [phone]

## 2014-11-10 NOTE — ED Notes (Addendum)
Patient complaining of rectal pain x 1 month. States he has a history of hemorrhoids. Patient denies rectal bleeding at this time.

## 2015-03-11 ENCOUNTER — Encounter (HOSPITAL_COMMUNITY): Payer: Self-pay | Admitting: *Deleted

## 2015-03-11 ENCOUNTER — Emergency Department (HOSPITAL_COMMUNITY)
Admission: EM | Admit: 2015-03-11 | Discharge: 2015-03-11 | Disposition: A | Discharge status: Home / Self Care | Payer: BLUE CROSS/BLUE SHIELD | Attending: Emergency Medicine | Admitting: Emergency Medicine

## 2015-03-11 DIAGNOSIS — R300 Dysuria: Secondary | ICD-10-CM | POA: Diagnosis present

## 2015-03-11 DIAGNOSIS — Z711 Person with feared health complaint in whom no diagnosis is made: Secondary | ICD-10-CM

## 2015-03-11 DIAGNOSIS — Z7952 Long term (current) use of systemic steroids: Secondary | ICD-10-CM | POA: Diagnosis not present

## 2015-03-11 DIAGNOSIS — Z202 Contact with and (suspected) exposure to infections with a predominantly sexual mode of transmission: Secondary | ICD-10-CM | POA: Diagnosis not present

## 2015-03-11 DIAGNOSIS — Z87891 Personal history of nicotine dependence: Secondary | ICD-10-CM | POA: Diagnosis not present

## 2015-03-11 DIAGNOSIS — R369 Urethral discharge, unspecified: Secondary | ICD-10-CM | POA: Diagnosis not present

## 2015-03-11 DIAGNOSIS — Z8719 Personal history of other diseases of the digestive system: Secondary | ICD-10-CM | POA: Diagnosis not present

## 2015-03-11 DIAGNOSIS — Z8619 Personal history of other infectious and parasitic diseases: Secondary | ICD-10-CM | POA: Diagnosis not present

## 2015-03-11 LAB — URINALYSIS, ROUTINE W REFLEX MICROSCOPIC
BILIRUBIN URINE: NEGATIVE
GLUCOSE, UA: NEGATIVE mg/dL
Hgb urine dipstick: NEGATIVE
Ketones, ur: NEGATIVE mg/dL
Leukocytes, UA: NEGATIVE
NITRITE: NEGATIVE
PH: 6 (ref 5.0–8.0)
Protein, ur: NEGATIVE mg/dL
Specific Gravity, Urine: 1.03 (ref 1.005–1.030)
Urobilinogen, UA: 0.2 mg/dL (ref 0.0–1.0)

## 2015-03-11 NOTE — ED Provider Notes (Signed)
CSN: [id number]     Arrival date & time 03/11/15  [id number] History   None    No chief complaint on file.    (Consider location/radiation/quality/duration/timing/severity/associated sxs/prior Treatment) HPI  This a 49 year old male with history of chlamydia who presents with dysuria and penile discharge. Patient reports symptoms onset on Sunday. He reports white discharge. He denies any new sexual partners. He reports inconsistent condom use. He denies any fevers, abdominal pain, flank pain. Denies any penile lesions.  Past Medical History  Diagnosis Date  . Acid reflux   . STD (male)   . Hemorrhoid    No past surgical history on file. No family history on file. Social History  Substance Use Topics  . Smoking status: Former Smoker -- 1.00 packs/day for 14 years    Types: Cigarettes    Quit date: 08/28/2013  . Smokeless tobacco: Never Used  . Alcohol Use: No     Comment: occ    Review of Systems  Constitutional: Negative for fever.  Genitourinary: Positive for dysuria and discharge.  All other systems reviewed and are negative.     Allergies  Bee venom and Poison oak extract  Home Medications   Prior to Admission medications   Medication Sig Start Date End Date Taking? Authorizing Provider  ciprofloxacin (CIPRO) 500 MG tablet Take 1 tablet (500 mg total) by mouth 2 (two) times daily. Patient not taking: Reported on 10/23/2014 09/28/14   Dione Boozeavid Glick, MD  doxycycline (VIBRAMYCIN) 100 MG capsule Take 1 capsule (100 mg total) by mouth 2 (two) times daily. Patient not taking: Reported on 10/23/2014 09/28/14   Dione Boozeavid Glick, MD  hydrocortisone (ANUSOL-HC) 2.5 % rectal cream Apply to peri rectal area 2 times daily 10/23/14   Burgess AmorJulie Idol, PA-C  hydrocortisone (ANUSOL-HC) 25 MG suppository Place 1 suppository (25 mg total) rectally 2 (two) times daily. 10/23/14   Burgess AmorJulie Idol, PA-C  liver oil-zinc oxide (DESITIN) 40 % ointment Apply 1 application topically as needed for irritation. 11/10/14    Azalia BilisKevin Campos, MD  predniSONE (DELTASONE) 10 MG tablet 6, 5, 4, 3, 2 then 1 tablet by mouth daily for 6 days total. 10/23/14   Burgess AmorJulie Idol, PA-C   There were no vitals taken for this visit. Physical Exam  Constitutional: He is oriented to person, place, and time. He appears well-developed and well-nourished. No distress.  HENT:  Head: Normocephalic and atraumatic.  Cardiovascular: Normal rate and regular rhythm.   Pulmonary/Chest: Effort normal. No respiratory distress.  Genitourinary: Penis normal.  Normal circumcised penis, no lesions noted, no discharge noted  Musculoskeletal: He exhibits no edema.  Neurological: He is alert and oriented to person, place, and time.  Skin: Skin is warm and dry.  Psychiatric: He has a normal mood and affect.  Nursing note and vitals reviewed.   ED Course  Procedures (including critical care time) Labs Review Labs Reviewed  URINALYSIS, ROUTINE W REFLEX MICROSCOPIC (NOT AT [facility])    Imaging Review No results found. I have personally reviewed and evaluated these images and lab results as part of my medical decision-making.   EKG Interpretation None      MDM   Final diagnoses:  Concern about STD in male without diagnosis    Patient presents with dysuria and penile discharge. Reports inconsistent condom use and history of chlamydia. Urinalysis is negative. GC Chlamydia swab pending. Patient was given azithromycin and Rocephin. Patient was instructed to abstain from sexual activity for 10 days.  After history, exam, and medical workup I  feel the patient has been appropriately medically screened and is safe for discharge home. Pertinent diagnoses were discussed with the patient. Patient was given return precautions.     Shon Baton, MD 03/11/15 [phone]

## 2015-03-11 NOTE — ED Notes (Signed)
Pt c/o discharge from penis and burning with urination

## 2015-03-12 LAB — GC/CHLAMYDIA PROBE AMP (~~LOC~~) NOT AT ARMC
CHLAMYDIA, DNA PROBE: NEGATIVE
NEISSERIA GONORRHEA: NEGATIVE

## 2015-04-18 IMAGING — CR DG CHEST 2V
2 series · 2 of 2 positions shown · non-contrast
Comparison: Prior chest x-ray 05/24/2012

CLINICAL DATA: Cough and fever x3 days

CHEST - 2 VIEW

[view not recorded (1 of 2)]
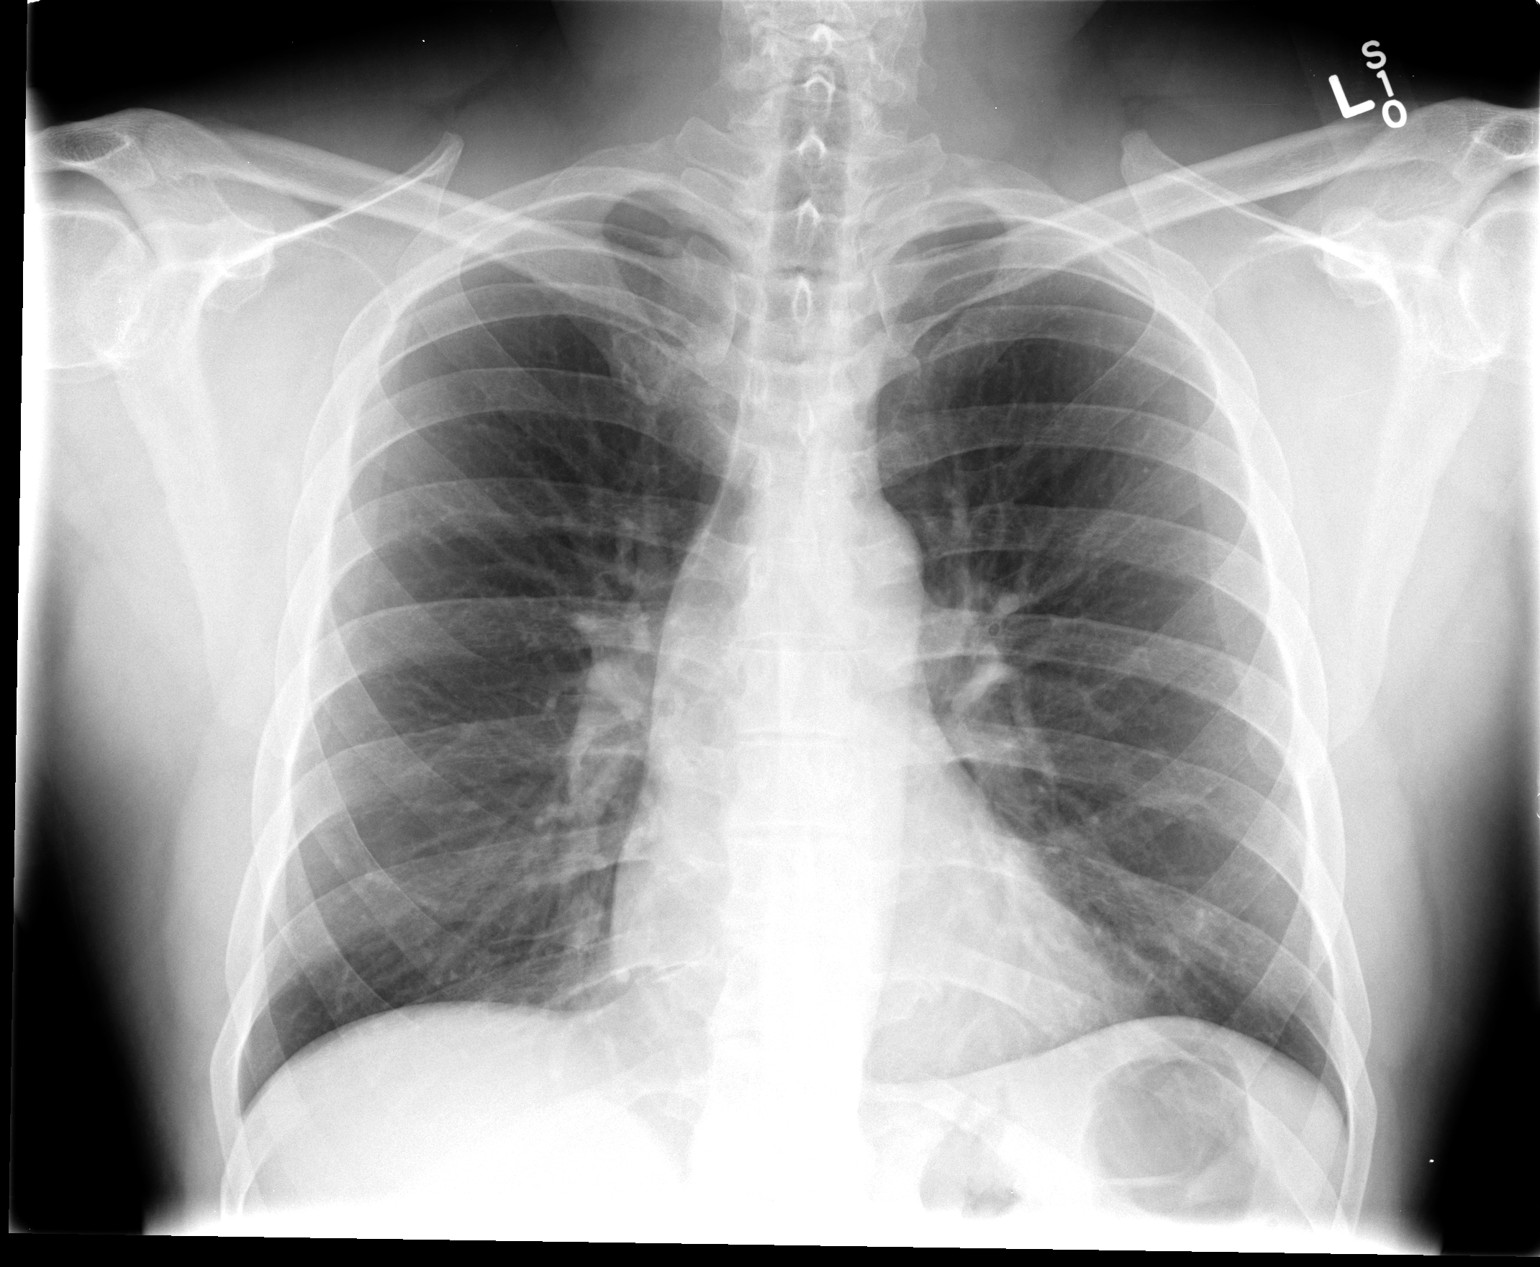

[view not recorded (2 of 2)]
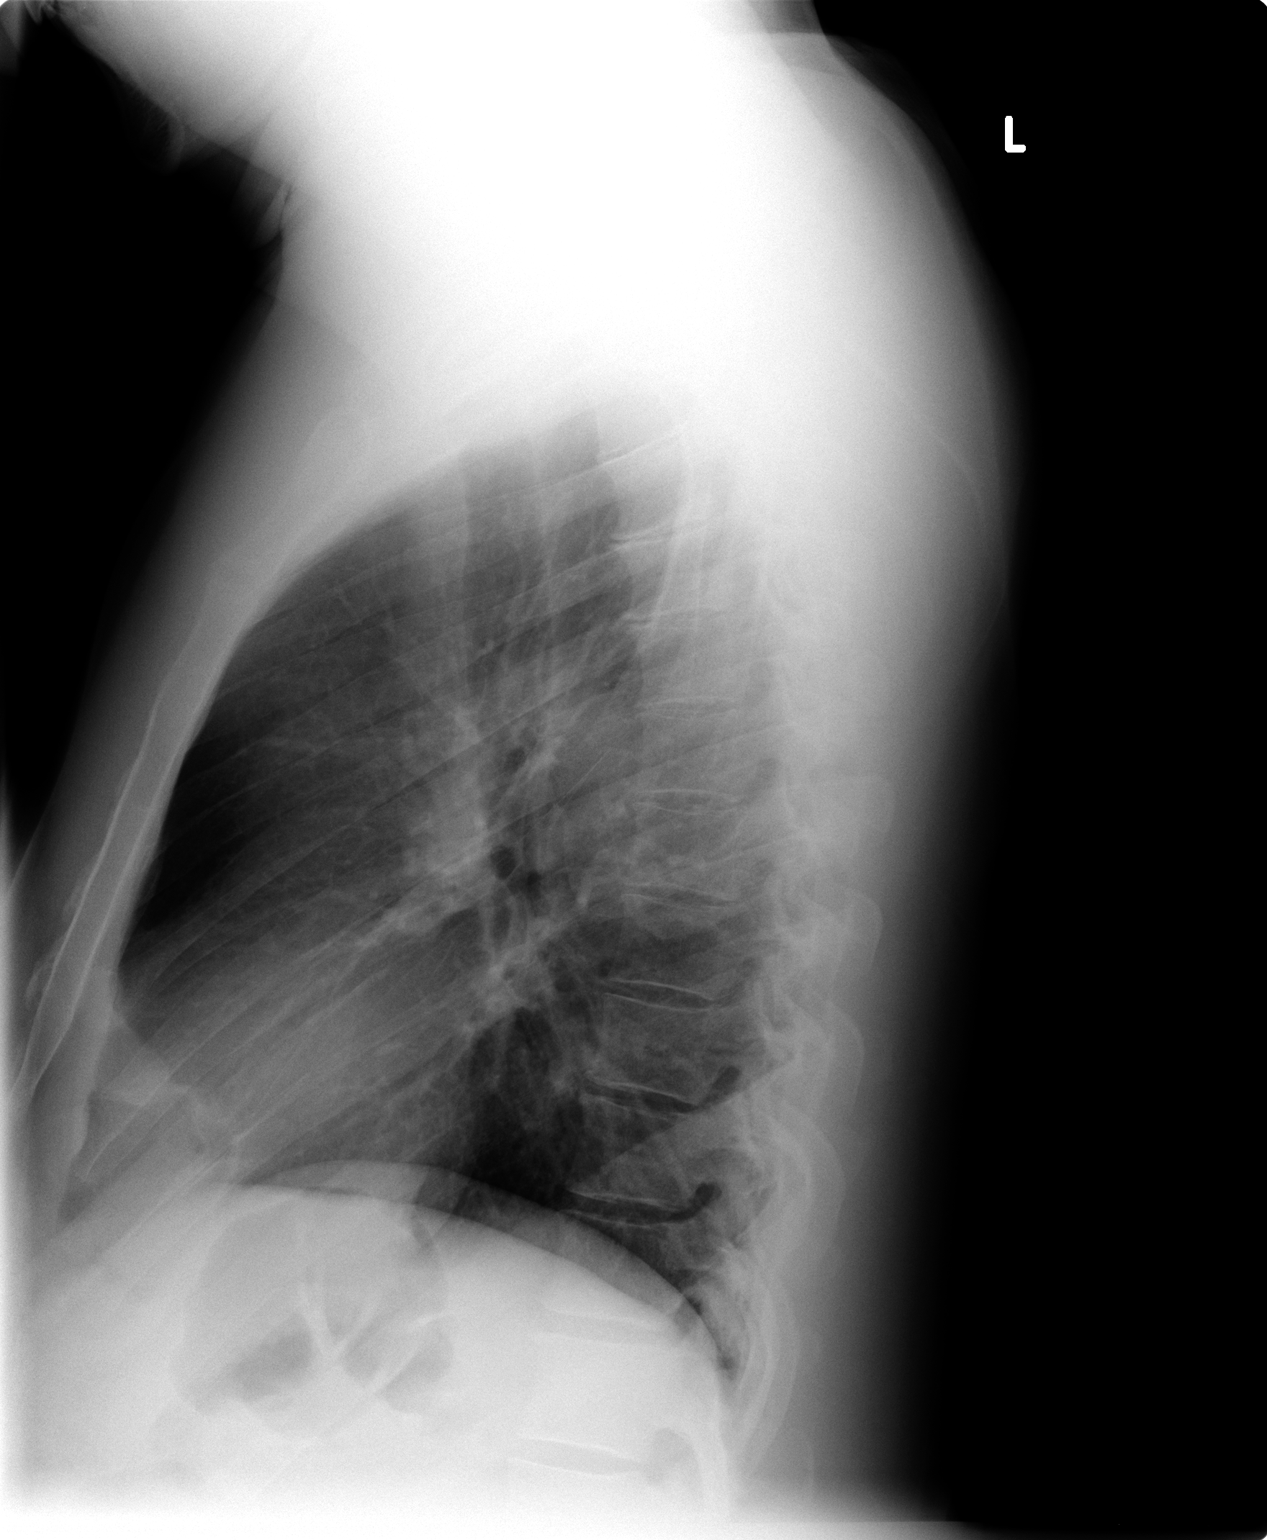

[2 of 2 positions shown; findings below may reference images not displayed]

FINDINGS: [The lungs are well-aerated and free from pulmonary
edema, focal airspace consolidation or pulmonary nodule.  Cardiac
and mediastinal contours are within normal limits.  No
pneumothorax, or pleural effusion. No acute osseous findings.]
IMPRESSION: No acute cardiopulmonary disease.

## 2015-04-24 ENCOUNTER — Emergency Department (HOSPITAL_COMMUNITY)
Admission: EM | Admit: 2015-04-24 | Discharge: 2015-04-24 | Disposition: A | Discharge status: Home / Self Care | Payer: BLUE CROSS/BLUE SHIELD | Attending: Emergency Medicine | Admitting: Emergency Medicine

## 2015-04-24 ENCOUNTER — Encounter (HOSPITAL_COMMUNITY): Payer: Self-pay

## 2015-04-24 DIAGNOSIS — K6289 Other specified diseases of anus and rectum: Secondary | ICD-10-CM | POA: Insufficient documentation

## 2015-04-24 DIAGNOSIS — Z8619 Personal history of other infectious and parasitic diseases: Secondary | ICD-10-CM | POA: Diagnosis not present

## 2015-04-24 DIAGNOSIS — Z87891 Personal history of nicotine dependence: Secondary | ICD-10-CM | POA: Insufficient documentation

## 2015-04-24 DIAGNOSIS — IMO0001 Reserved for inherently not codable concepts without codable children: Secondary | ICD-10-CM

## 2015-04-24 DIAGNOSIS — K921 Melena: Secondary | ICD-10-CM | POA: Diagnosis present

## 2015-04-24 DIAGNOSIS — R030 Elevated blood-pressure reading, without diagnosis of hypertension: Secondary | ICD-10-CM | POA: Diagnosis not present

## 2015-04-24 DIAGNOSIS — R369 Urethral discharge, unspecified: Secondary | ICD-10-CM | POA: Insufficient documentation

## 2015-04-24 DIAGNOSIS — Z87438 Personal history of other diseases of male genital organs: Secondary | ICD-10-CM

## 2015-04-24 LAB — URINALYSIS, ROUTINE W REFLEX MICROSCOPIC
Bilirubin Urine: NEGATIVE
GLUCOSE, UA: NEGATIVE mg/dL
Hgb urine dipstick: NEGATIVE
KETONES UR: NEGATIVE mg/dL
LEUKOCYTES UA: NEGATIVE
NITRITE: NEGATIVE
PROTEIN: NEGATIVE mg/dL
Specific Gravity, Urine: 1.025 (ref 1.005–1.030)
pH: 6.5 (ref 5.0–8.0)

## 2015-04-24 LAB — CBC WITH DIFFERENTIAL/PLATELET
BASOS ABS: 0 10*3/uL (ref 0.0–0.1)
BASOS PCT: 0 %
Eosinophils Absolute: 0.1 10*3/uL (ref 0.0–0.7)
Eosinophils Relative: 2 %
HCT: 38.4 % — ABNORMAL LOW (ref 39.0–52.0)
Hemoglobin: 12.8 g/dL — ABNORMAL LOW (ref 13.0–17.0)
Lymphocytes Relative: 28 %
Lymphs Abs: 2 10*3/uL (ref 0.7–4.0)
MCH: 32.6 pg (ref 26.0–34.0)
MCHC: 33.3 g/dL (ref 30.0–36.0)
MCV: 97.7 fL (ref 78.0–100.0)
MONO ABS: 0.7 10*3/uL (ref 0.1–1.0)
Monocytes Relative: 10 %
NEUTROS ABS: 4.5 10*3/uL (ref 1.7–7.7)
NEUTROS PCT: 60 %
Platelets: 180 10*3/uL (ref 150–400)
RBC: 3.93 MIL/uL — ABNORMAL LOW (ref 4.22–5.81)
RDW: 12.5 % (ref 11.5–15.5)
WBC: 7.4 10*3/uL (ref 4.0–10.5)

## 2015-04-24 LAB — COMPREHENSIVE METABOLIC PANEL
ALT: 13 U/L — ABNORMAL LOW (ref 17–63)
AST: 30 U/L (ref 15–41)
Albumin: 4.4 g/dL (ref 3.5–5.0)
Alkaline Phosphatase: 66 U/L (ref 38–126)
Anion gap: 8 (ref 5–15)
BILIRUBIN TOTAL: 1.3 mg/dL — AB (ref 0.3–1.2)
BUN: 19 mg/dL (ref 6–20)
CO2: 28 mmol/L (ref 22–32)
Calcium: 8.9 mg/dL (ref 8.9–10.3)
Chloride: 103 mmol/L (ref 101–111)
Creatinine, Ser: 1.13 mg/dL (ref 0.61–1.24)
Glucose, Bld: 100 mg/dL — ABNORMAL HIGH (ref 65–99)
POTASSIUM: 3.6 mmol/L (ref 3.5–5.1)
Sodium: 139 mmol/L (ref 135–145)
TOTAL PROTEIN: 7.5 g/dL (ref 6.5–8.1)

## 2015-04-24 LAB — POC OCCULT BLOOD, ED: Fecal Occult Bld: NEGATIVE

## 2015-04-24 MED ORDER — DOXYCYCLINE HYCLATE 100 MG PO CAPS: 100.0000 mg | ORAL_CAPSULE | Freq: Two times a day (BID) | ORAL | Status: DC

## 2015-04-24 MED ORDER — LIDOCAINE HCL (PF) 1 % IJ SOLN
INTRAMUSCULAR | Status: AC
  Filled 2015-04-24: qty 5

## 2015-04-24 MED ORDER — DOXYCYCLINE HYCLATE 100 MG PO TABS
100.0000 mg | ORAL_TABLET | Freq: Once | ORAL | Status: AC
Start: 2015-04-24 — End: 2015-04-24
  Administered 2015-04-24: 100 mg via ORAL
  Filled 2015-04-24: qty 1

## 2015-04-24 MED ORDER — CEFTRIAXONE SODIUM 250 MG IJ SOLR
250.0000 mg | Freq: Once | INTRAMUSCULAR | Status: AC
  Administered 2015-04-24: 250 mg via INTRAMUSCULAR
  Filled 2015-04-24: qty 250

## 2015-04-24 NOTE — ED Notes (Signed)
I am having problems with urination and discharge; also having bleeding from my bowels. It is a lot of blood that is dark. Having regular bowel movements and diarrhea.

## 2015-04-24 NOTE — Discharge Instructions (Signed)
Hypertension Hypertension, commonly called high blood pressure, is when the force of blood pumping through your arteries is too strong. Your arteries are the blood vessels that carry blood from your heart throughout your body. A blood pressure reading consists of a higher number over a lower number, such as 110/72. The higher number (systolic) is the pressure inside your arteries when your heart pumps. The lower number (diastolic) is the pressure inside your arteries when your heart relaxes. Ideally you want your blood pressure below 120/80. Hypertension forces your heart to work harder to pump blood. Your arteries may become narrow or stiff. Having untreated or uncontrolled hypertension can cause heart attack, stroke, kidney disease, and other problems. RISK FACTORS Some risk factors for high blood pressure are controllable. Others are not.  Risk factors you cannot control include:   Race. You may be at higher risk if you are African American.  Age. Risk increases with age.  Gender. Men are at higher risk than women before age 45 years. After age 65, women are at higher risk than men. Risk factors you can control include:  Not getting enough exercise or physical activity.  Being overweight.  Getting too much fat, sugar, calories, or salt in your diet.  Drinking too much alcohol. SIGNS AND SYMPTOMS Hypertension does not usually cause signs or symptoms. Extremely high blood pressure (hypertensive crisis) may cause headache, anxiety, shortness of breath, and nosebleed. DIAGNOSIS To check if you have hypertension, your health care provider will measure your blood pressure while you are seated, with your arm held at the level of your heart. It should be measured at least twice using the same arm. Certain conditions can cause a difference in blood pressure between your right and left arms. A blood pressure reading that is higher than normal on one occasion does not mean that you need treatment. If  it is not clear whether you have high blood pressure, you may be asked to return on a different day to have your blood pressure checked again. Or, you may be asked to monitor your blood pressure at home for 1 or more weeks. TREATMENT Treating high blood pressure includes making lifestyle changes and possibly taking medicine. Living a healthy lifestyle can help lower high blood pressure. You may need to change some of your habits. Lifestyle changes may include:  Following the DASH diet. This diet is high in fruits, vegetables, and whole grains. It is low in salt, red meat, and added sugars.  Keep your sodium intake below 2,300 mg per day.  Getting at least 30-45 minutes of aerobic exercise at least 4 times per week.  Losing weight if necessary.  Not smoking.  Limiting alcoholic beverages.  Learning ways to reduce stress. Your health care provider may prescribe medicine if lifestyle changes are not enough to get your blood pressure under control, and if one of the following is true:  You are 18-59 years of age and your systolic blood pressure is above 140.  You are 60 years of age or older, and your systolic blood pressure is above 150.  Your diastolic blood pressure is above 90.  You have diabetes, and your systolic blood pressure is over 140 or your diastolic blood pressure is over 90.  You have kidney disease and your blood pressure is above 140/90.  You have heart disease and your blood pressure is above 140/90. Your personal target blood pressure may vary depending on your medical conditions, your age, and other factors. HOME CARE INSTRUCTIONS    Have your blood pressure rechecked as directed by your health care provider.   Take medicines only as directed by your health care provider. Follow the directions carefully. Blood pressure medicines must be taken as prescribed. The medicine does not work as well when you skip doses. Skipping doses also puts you at risk for  problems.  Do not smoke.   Monitor your blood pressure at home as directed by your health care provider. SEEK MEDICAL CARE IF:   You think you are having a reaction to medicines taken.  You have recurrent headaches or feel dizzy.  You have swelling in your ankles.  You have trouble with your vision. SEEK IMMEDIATE MEDICAL CARE IF:  You develop a severe headache or confusion.  You have unusual weakness, numbness, or feel faint.  You have severe chest or abdominal pain.  You vomit repeatedly.  You have trouble breathing. MAKE SURE YOU:   Understand these instructions.  Will watch your condition.  Will get help right away if you are not doing well or get worse.   This information is not intended to replace advice given to you by your health care provider. Make sure you discuss any questions you have with your health care provider.   Document Released: 05/16/2005 Document Revised: 09/30/2014 Document Reviewed: 03/08/2013 Elsevier Interactive Patient Education [employer]2016 Elsevier Inc[employer]    Take the entire course of the antibiotic prescribed.  You have been treated for your penile discharge (coverage for possible gonorrhea and chlamydia), but as discussed your cultures have been negative for these infections in the past and I suspect they will be again today.  The doxycycline antibiotic will also cover for possible prostate infection.  This might also help improve your problems with slower urine as discussed.  Followup with your primary doctor as soon as you can get in.   Your blood pressure is elevated today.  You should have this rechecked within 1 week.

## 2015-04-26 NOTE — ED Provider Notes (Signed)
CSN: [id number]     Arrival date & time 04/24/15  1850 History   First MD Initiated Contact with Patient 04/24/15 2117     Chief Complaint  Patient presents with  . Blood In Stools     (Consider location/radiation/quality/duration/timing/severity/associated sxs/prior Treatment) The history is provided by the patient.   Philip Gonzalez is a 49 y.o. male with several complaints, the first reports has had increased dark bloody stools, last episode prior to arrival here.  He has a history of internal hemorrhoids and has had inceased perianal itching and irritation. He uses preparation H which is usually effective for these frequent episodes.  He denies weakness, dizziness, nausea, vomiting, abdominal pain, fevers.  Secondly,  His frequent problem of penile discharge has again returned and reports chronic decreased urinary stream without dysuria or hematuria.  He has had frequent intermittent episodes of similar symptoms, treated here as urethritis on multiple occasions. He states his symptoms improve for awhile after treatment with antibiotics but continue to return.  He denies risk factors for stds, reports one sex partner and does not use condoms.  Partner without symptoms.  Of note, his prior gc/chlamydia cultures have been negative.     Past Medical History  Diagnosis Date  . Acid reflux   . STD (male)   . Hemorrhoid    History reviewed. No pertinent past surgical history. No family history on file. Social History  Substance Use Topics  . Smoking status: Former Smoker -- 1.00 packs/day for 14 years    Types: Cigarettes    Quit date: 08/28/2013  . Smokeless tobacco: Never Used  . Alcohol Use: No     Comment: occ    Review of Systems  Constitutional: Negative for fever.  HENT: Negative for congestion and sore throat.   Eyes: Negative.   Respiratory: Negative for chest tightness and shortness of breath.   Cardiovascular: Negative for chest pain.  Gastrointestinal: Negative for  nausea, vomiting, abdominal pain, diarrhea and blood in stool.  Genitourinary: Positive for frequency and discharge. Negative for dysuria, urgency, penile swelling, scrotal swelling and penile pain.  Musculoskeletal: Negative for joint swelling, arthralgias and neck pain.  Skin: Negative.  Negative for rash and wound.  Neurological: Negative for dizziness, weakness, light-headedness, numbness and headaches.  Psychiatric/Behavioral: Negative.       Allergies  Bee venom and Poison oak extract  Home Medications   Prior to Admission medications   Medication Sig Start Date End Date Taking? Authorizing Provider  hydrocortisone (ANUSOL-HC) 25 MG suppository Place 1 suppository (25 mg total) rectally 2 (two) times daily. 10/23/14  Yes Burgess AmorJulie Rosey Eide, PA-C  doxycycline (VIBRAMYCIN) 100 MG capsule Take 1 capsule (100 mg total) by mouth 2 (two) times daily. 04/24/15   Burgess AmorJulie Kaysi Ourada, PA-C  hydrocortisone (ANUSOL-HC) 2.5 % rectal cream Apply to peri rectal area 2 times daily Patient not taking: Reported on 04/24/2015 10/23/14   Burgess AmorJulie Kyria Bumgardner, PA-C  liver oil-zinc oxide (DESITIN) 40 % ointment Apply 1 application topically as needed for irritation. Patient not taking: Reported on 04/24/2015 11/10/14   Azalia BilisKevin Campos, MD  predniSONE (DELTASONE) 10 MG tablet 6, 5, 4, 3, 2 then 1 tablet by mouth daily for 6 days total. Patient not taking: Reported on 04/24/2015 10/23/14   Burgess AmorJulie Donavon Kimrey, PA-C   BP 150/107 mmHg  Pulse 76  Temp(Src) 97.6 F (36.4 C) (Oral)  Resp 20  Ht 5\' 6"  (1.676 m)  Wt 90.719 kg  BMI 32.30 kg/m2  SpO2 100% Physical Exam  Constitutional: He appears well-developed and well-nourished.  HENT:  Head: Normocephalic and atraumatic.  Eyes: Conjunctivae are normal.  Neck: Normal range of motion.  Cardiovascular: Normal rate, regular rhythm, normal heart sounds and intact distal pulses.   Pulmonary/Chest: Effort normal and breath sounds normal. He has no wheezes.  Abdominal: Soft. Bowel sounds are  normal. There is no tenderness. There is no rebound and no guarding.  Genitourinary: Testes normal. Rectal exam shows no external hemorrhoid, no internal hemorrhoid, no mass and no tenderness. Guaiac negative stool. Prostate is enlarged. Prostate is not tender. No discharge found.  Chaperone was present during exam.   Musculoskeletal: Normal range of motion.  Neurological: He is alert.  Skin: Skin is warm and dry.  Psychiatric: He has a normal mood and affect.  Nursing note and vitals reviewed.   ED Course  Procedures (including critical care time) Labs Review Labs Reviewed  CBC WITH DIFFERENTIAL/PLATELET - Abnormal; Notable for the following:    RBC 3.93 (*)    Hemoglobin 12.8 (*)    HCT 38.4 (*)    All other components within normal limits  COMPREHENSIVE METABOLIC PANEL - Abnormal; Notable for the following:    Glucose, Bld 100 (*)    ALT 13 (*)    Total Bilirubin 1.3 (*)    All other components within normal limits  URINALYSIS, ROUTINE W REFLEX MICROSCOPIC (NOT AT [facility])  POC OCCULT BLOOD, ED  GC/CHLAMYDIA PROBE AMP ([place]) NOT AT [facility]    Imaging Review No results found. I have personally reviewed and evaluated these images and lab results as part of my medical decision-making.   EKG Interpretation None      MDM   Final diagnoses:  Rectal pain  History of penile discharge  Elevated blood pressure    Labs reviewed, negative with gc/chlamydia cultures pending. Pt was given rocephin 250 mg given h/o penile dc.  I suspect he may have a chronic prostatitis given hx, sx and no prior positive std cultures.  He was placed on doxycycline bid x 14 days.  Advised f/u with his pcp [facility]) and suggested getting evaluation by urology with his clinic. Prn f/u anticipated.    The patient appears reasonably screened and/or stabilized for discharge and I doubt any other medical condition or other [facility] requiring further screening, evaluation, or treatment in the ED at this  time prior to discharge.        Burgess Amor, PA-C 04/26/15 1610  Glynn Octave, MD 04/27/15 [phone]

## 2015-04-27 LAB — GC/CHLAMYDIA PROBE AMP (~~LOC~~) NOT AT ARMC
Chlamydia: NEGATIVE
NEISSERIA GONORRHEA: NEGATIVE

## 2015-06-04 ENCOUNTER — Encounter (HOSPITAL_COMMUNITY): Payer: Self-pay

## 2015-06-04 ENCOUNTER — Emergency Department (HOSPITAL_COMMUNITY)
Admission: EM | Admit: 2015-06-04 | Discharge: 2015-06-04 | Disposition: A | Discharge status: Home / Self Care | Payer: BLUE CROSS/BLUE SHIELD | Attending: Emergency Medicine | Admitting: Emergency Medicine

## 2015-06-04 DIAGNOSIS — Z87891 Personal history of nicotine dependence: Secondary | ICD-10-CM | POA: Diagnosis not present

## 2015-06-04 DIAGNOSIS — R0602 Shortness of breath: Secondary | ICD-10-CM | POA: Insufficient documentation

## 2015-06-04 DIAGNOSIS — R202 Paresthesia of skin: Secondary | ICD-10-CM | POA: Diagnosis not present

## 2015-06-04 DIAGNOSIS — I10 Essential (primary) hypertension: Secondary | ICD-10-CM | POA: Diagnosis not present

## 2015-06-04 DIAGNOSIS — R200 Anesthesia of skin: Secondary | ICD-10-CM | POA: Diagnosis present

## 2015-06-04 DIAGNOSIS — Z7952 Long term (current) use of systemic steroids: Secondary | ICD-10-CM | POA: Insufficient documentation

## 2015-06-04 DIAGNOSIS — Z792 Long term (current) use of antibiotics: Secondary | ICD-10-CM | POA: Insufficient documentation

## 2015-06-04 DIAGNOSIS — Z8719 Personal history of other diseases of the digestive system: Secondary | ICD-10-CM | POA: Insufficient documentation

## 2015-06-04 DIAGNOSIS — Z8619 Personal history of other infectious and parasitic diseases: Secondary | ICD-10-CM | POA: Diagnosis not present

## 2015-06-04 HISTORY — DX: Essential (primary) hypertension: I10

## 2015-06-04 LAB — I-STAT CHEM 8, ED
BUN: 9 mg/dL (ref 6–20)
CHLORIDE: 98 mmol/L — AB (ref 101–111)
Calcium, Ion: 1.12 mmol/L (ref 1.12–1.23)
Creatinine, Ser: 1.4 mg/dL — ABNORMAL HIGH (ref 0.61–1.24)
Glucose, Bld: 98 mg/dL (ref 65–99)
HEMATOCRIT: 46 % (ref 39.0–52.0)
Hemoglobin: 15.6 g/dL (ref 13.0–17.0)
Potassium: 3.7 mmol/L (ref 3.5–5.1)
Sodium: 139 mmol/L (ref 135–145)
TCO2: 28 mmol/L (ref 0–100)

## 2015-06-04 NOTE — ED Notes (Addendum)
Pt reports numbness in left arm and hand x 3 days and intermittent chest pain.  Reports has been taking his bp medication as prescribed.  Pt says while in triage, noticed some numbness in left thigh.

## 2015-06-04 NOTE — Discharge Instructions (Signed)
Paresthesia Contact Dr. Loleta ChanceHill tomorrow to schedule next available office appointment. Your blood pressure should be rechecked within the next 3 weeks. Today's was elevated at 152/111. Your kidney function should also be rechecked within the next 3 weeks. Today's blood creatinine level was 1.4. Take these instructions with you to your office visit. Return if you feel worse for any reason. Paresthesia is an abnormal burning or prickling sensation. This sensation is generally felt in the hands, arms, legs, or feet. However, it may occur in any part of the body. Usually, it is not painful. The feeling may be described as:  Tingling or numbness.  Pins and needles.  Skin crawling.  Buzzing.  Limbs falling asleep.  Itching. Most people experience temporary (transient) paresthesia at some time in their lives. Paresthesia may occur when you breathe too quickly (hyperventilation). It can also occur without any apparent cause. Commonly, paresthesia occurs when pressure is placed on a nerve. The sensation quickly goes away after the pressure is removed. For some people, however, paresthesia is a long-lasting (chronic) condition that is caused by an underlying disorder. If you continue to have paresthesia, you may need further medical evaluation. HOME CARE INSTRUCTIONS Watch your condition for any changes. Taking the following actions may help to lessen any discomfort that you are feeling:  Avoid drinking alcohol.  Try acupuncture or massage to help relieve your symptoms.  Keep all follow-up visits as directed by your health care provider. This is important. SEEK MEDICAL CARE IF:  You continue to have episodes of paresthesia.  Your burning or prickling feeling gets worse when you walk.  You have pain, cramps, or dizziness.  You develop a rash. SEEK IMMEDIATE MEDICAL CARE IF:  You feel weak.  You have trouble walking or moving.  You have problems with speech, understanding, or vision.  You  feel confused.  You cannot control your bladder or bowel movements.  You have numbness after an injury.  You faint.   This information is not intended to replace advice given to you by your health care provider. Make sure you discuss any questions you have with your health care provider.   Document Released: 05/06/2002 Document Revised: 09/30/2014 Document Reviewed: 05/12/2014 Elsevier Interactive Patient Education [employer]2016 Elsevier Inc.

## 2015-06-04 NOTE — ED Provider Notes (Signed)
CSN: [id number]     Arrival date & time 06/04/15  1039 History  By signing my name below, I, Tanda RockersMargaux Venter, attest that this documentation has been prepared under the direction and in the presence of Doug SouSam Adel Neyer, MD. Electronically Signed: Tanda RockersMargaux Venter, ED Scribe. 06/04/2015. 11:42 AM.   Chief Complaint  Patient presents with  . Chest Pain  . Numbness   The history is provided by the patient. No language interpreter was used.     HPI Comments: Philip Gonzalez is a 50 y.o. male with hx HTN who presents to the Emergency Department complaining of gradual onset, intermittent, left arm numbness x 3 days. The numbness usually lasts 1-2 hours before subsiding. There are no modifying factors to the numbness. Pt also complains of paresthesia left thigh thigh. He mentions having mild shortness of breath last night that lasted about 1 hour. Denies focal weakness, chest pain, fever, or any other associated symptoms. Pt is a former smoker who quit 2 years ago (20 year smoker). Does not drink EtOH and does not use illicit drugs. FHx MI mother, pt cannot say at what age.  Patient has shortness of breath lasting 1 hour last night which resolved spontaneously. All symptoms are nonexertional.  Past Medical History  Diagnosis Date  . Acid reflux   . STD (male)   . Hemorrhoid   . Hypertension    History reviewed. No pertinent past surgical history. No family history on file. Social History  Substance Use Topics  . Smoking status: Former Smoker -- 1.00 packs/day for 14 years    Types: Cigarettes    Quit date: 08/28/2013  . Smokeless tobacco: Never Used  . Alcohol Use: No     Comment: occ    Review of Systems  Constitutional: Negative for fever.  HENT: Negative.   Respiratory: Positive for shortness of breath.   Cardiovascular: Negative.  Negative for chest pain.  Gastrointestinal: Negative.   Musculoskeletal: Negative.   Skin: Negative.   Neurological: Positive for numbness. Negative for  weakness.       Paresthesias to left thigh  Psychiatric/Behavioral: Negative.   All other systems reviewed and are negative.  Allergies  Bee venom and Poison oak extract  Home Medications   Prior to Admission medications   Medication Sig Start Date End Date Taking? Authorizing Provider  doxycycline (VIBRAMYCIN) 100 MG capsule Take 1 capsule (100 mg total) by mouth 2 (two) times daily. 04/24/15   Burgess AmorJulie Idol, PA-C  hydrocortisone (ANUSOL-HC) 2.5 % rectal cream Apply to peri rectal area 2 times daily Patient not taking: Reported on 04/24/2015 10/23/14   Burgess AmorJulie Idol, PA-C  hydrocortisone (ANUSOL-HC) 25 MG suppository Place 1 suppository (25 mg total) rectally 2 (two) times daily. 10/23/14   Burgess AmorJulie Idol, PA-C  liver oil-zinc oxide (DESITIN) 40 % ointment Apply 1 application topically as needed for irritation. Patient not taking: Reported on 04/24/2015 11/10/14   Azalia BilisKevin Campos, MD  predniSONE (DELTASONE) 10 MG tablet 6, 5, 4, 3, 2 then 1 tablet by mouth daily for 6 days total. Patient not taking: Reported on 04/24/2015 10/23/14   Burgess AmorJulie Idol, PA-C   Triage Vitals:  BP 148/97 mmHg  Pulse 87  Temp(Src) 97.6 F (36.4 C) (Oral)  Resp 20  Ht 5\' 6"  (1.676 m)  Wt 190 lb (86.183 kg)  BMI 30.68 kg/m2  SpO2 99%   Physical Exam  Constitutional: He is oriented to person, place, and time. He appears well-developed and well-nourished.  HENT:  Head: Normocephalic and  atraumatic.  Eyes: Conjunctivae are normal. Pupils are equal, round, and reactive to light.  Neck: Neck supple. No tracheal deviation present. No thyromegaly present.  Cardiovascular: Normal rate and regular rhythm.   No murmur heard. Pulmonary/Chest: Effort normal and breath sounds normal.  Abdominal: Soft. Bowel sounds are normal. He exhibits no distension. There is no tenderness.  Musculoskeletal: Normal range of motion. He exhibits no edema or tenderness.  Neurological: He is alert and oriented to person, place, and time. He has normal  reflexes. No cranial nerve deficit. Coordination normal.  DTRs symmetric bilaterally at knee jerk and ankle jerk and biceps was ordered bilaterally gait normal Romberg normal finger to nose normal DTRs symmetric bilaterally at knee jerk and ankle jerk and biceps was ordered bilaterally. Motor strength 5 over 5 overall  Skin: Skin is warm and dry. No rash noted.  Psychiatric: He has a normal mood and affect.  Nursing note and vitals reviewed.   ED Course  Procedures (including critical care time)  DIAGNOSTIC STUDIES: Oxygen Saturation is 99% on RA, normal by my interpretation.    COORDINATION OF CARE: 11:41 AM-Discussed treatment plan with pt at bedside and pt agreed to plan.   Labs Review Labs Reviewed - No data to display  Imaging Review No results found.   EKG Interpretation   Date/Time:  Thursday June 04 2015 11:15:51 EST Ventricular Rate:  91 PR Interval:  138 QRS Duration: 78 QT Interval:  380 QTC Calculation: 467 R Axis:   20 Text Interpretation:  Sinus rhythm with occasional Premature ventricular  complexes Anteroseptal infarct , age undetermined Abnormal ECG Premature  ventricular complexes New since previous tracing Confirmed by Chirsty Armistead   MD, Kym Scannell [phone]) on 06/04/2015 11:28:11 AM     2:20 PM patient alert Glasgow Coma Score 15. No distress. MDM  Plan follow-up with Dr. Loleta Chance. Blood pressure recheck 3 weeks patient should also have serum creatinine rechecked Final diagnoses:  None   no signs of acute stroke.. Diagnosis #1 paresthesias #2 elevated blood pressure #3 renal insufficiency  I personally performed the services described in this documentation, which was scribed in my presence. The recorded information has been reviewed and considered.      Doug Sou, MD 06/04/15 1428

## 2015-06-04 NOTE — ED Notes (Signed)
Pt ambulatory at this time and says nubness is going away.

## 2015-06-04 NOTE — ED Notes (Signed)
Pt states he has not taken any of his medications this am.

## 2015-06-04 NOTE — ED Notes (Signed)
Loney LaurenceH. Bryant PA notified of pts symptoms.  Will get pt in a room asap and he will examine pt.

## 2015-06-27 ENCOUNTER — Emergency Department (HOSPITAL_COMMUNITY)
Admission: EM | Admit: 2015-06-27 | Discharge: 2015-06-27 | Disposition: A | Discharge status: Home / Self Care | Payer: BLUE CROSS/BLUE SHIELD | Attending: Emergency Medicine | Admitting: Emergency Medicine

## 2015-06-27 ENCOUNTER — Encounter (HOSPITAL_COMMUNITY): Payer: Self-pay

## 2015-06-27 DIAGNOSIS — Z87891 Personal history of nicotine dependence: Secondary | ICD-10-CM | POA: Diagnosis not present

## 2015-06-27 DIAGNOSIS — R369 Urethral discharge, unspecified: Secondary | ICD-10-CM | POA: Insufficient documentation

## 2015-06-27 DIAGNOSIS — I10 Essential (primary) hypertension: Secondary | ICD-10-CM | POA: Insufficient documentation

## 2015-06-27 DIAGNOSIS — Z79899 Other long term (current) drug therapy: Secondary | ICD-10-CM | POA: Diagnosis not present

## 2015-06-27 DIAGNOSIS — K219 Gastro-esophageal reflux disease without esophagitis: Secondary | ICD-10-CM | POA: Diagnosis not present

## 2015-06-27 DIAGNOSIS — Z8619 Personal history of other infectious and parasitic diseases: Secondary | ICD-10-CM | POA: Insufficient documentation

## 2015-06-27 DIAGNOSIS — R300 Dysuria: Secondary | ICD-10-CM | POA: Insufficient documentation

## 2015-06-27 DIAGNOSIS — Z8719 Personal history of other diseases of the digestive system: Secondary | ICD-10-CM | POA: Diagnosis not present

## 2015-06-27 LAB — URINALYSIS, ROUTINE W REFLEX MICROSCOPIC
Bilirubin Urine: NEGATIVE
Glucose, UA: NEGATIVE mg/dL
Hgb urine dipstick: NEGATIVE
Ketones, ur: NEGATIVE mg/dL
Leukocytes, UA: NEGATIVE
Nitrite: NEGATIVE
Protein, ur: NEGATIVE mg/dL
Specific Gravity, Urine: 1.025 (ref 1.005–1.030)
pH: 6 (ref 5.0–8.0)

## 2015-06-27 MED ORDER — AZITHROMYCIN 250 MG PO TABS
1000.0000 mg | ORAL_TABLET | Freq: Once | ORAL | Status: AC
  Administered 2015-06-27: 1000 mg via ORAL
  Filled 2015-06-27: qty 4

## 2015-06-27 MED ORDER — CEFTRIAXONE SODIUM 250 MG IJ SOLR
250.0000 mg | Freq: Once | INTRAMUSCULAR | Status: AC
  Administered 2015-06-27: 250 mg via INTRAMUSCULAR
  Filled 2015-06-27: qty 250

## 2015-06-27 MED ORDER — LIDOCAINE HCL (PF) 1 % IJ SOLN
INTRAMUSCULAR | Status: AC
  Filled 2015-06-27: qty 5

## 2015-06-27 MED ORDER — SULFAMETHOXAZOLE-TRIMETHOPRIM 800-160 MG PO TABS: 1.0000 | ORAL_TABLET | Freq: Two times a day (BID) | ORAL | Status: AC

## 2015-06-27 NOTE — ED Notes (Signed)
Instructed pt to take all of antibiotics as prescribed. 

## 2015-06-27 NOTE — ED Notes (Signed)
Started noticing penile discharge a couple of days ago. White is color. Burning with urination, having pain and itching. No change in partners recently.

## 2015-06-27 NOTE — Discharge Instructions (Signed)
Dysuria Dysuria is pain or discomfort while urinating. The pain or discomfort may be felt in the tube that carries urine out of the bladder (urethra) or in the surrounding tissue of the genitals. The pain may also be felt in the groin area, lower abdomen, and lower back. You may have to urinate frequently or have the sudden feeling that you have to urinate (urgency). Dysuria can affect both men and women, but is more common in women. Dysuria can be caused by many different things, including:  Urinary tract infection in women.  Infection of the kidney or bladder.  Kidney stones or bladder stones.  Certain sexually transmitted infections (STIs), such as chlamydia.  Dehydration.  Inflammation of the vagina.  Use of certain medicines.  Use of certain soaps or scented products that cause irritation. HOME CARE INSTRUCTIONS Watch your dysuria for any changes. The following actions may help to reduce any discomfort you are feeling:  Drink enough fluid to keep your urine clear or pale yellow.  Empty your bladder often. Avoid holding urine for long periods of time.  After a bowel movement or urination, women should cleanse from front to back, using each tissue only once.  Empty your bladder after sexual intercourse.  Take medicines only as directed by your health care provider.  If you were prescribed an antibiotic medicine, finish it all even if you start to feel better.  Avoid caffeine, tea, and alcohol. They can irritate the bladder and make dysuria worse. In men, alcohol may irritate the prostate.  Keep all follow-up visits as directed by your health care provider. This is important.  If you had any tests done to find the cause of dysuria, it is your responsibility to obtain your test results. Ask the lab or department performing the test when and how you will get your results. Talk with your health care provider if you have any questions about your results. SEEK MEDICAL CARE  IF:  You develop pain in your back or sides.  You have a fever.  You have nausea or vomiting.  You have blood in your urine.  You are not urinating as often as you usually do. SEEK IMMEDIATE MEDICAL CARE IF:  You pain is severe and not relieved with medicines.  You are unable to hold down any fluids.  You or someone else notices a change in your mental function.  You have a rapid heartbeat at rest.  You have shaking or chills.  You feel extremely weak.   This information is not intended to replace advice given to you by your health care provider. Make sure you discuss any questions you have with your health care provider.   Document Released: 02/12/2004 Document Revised: 06/06/2014 Document Reviewed: 01/09/2014 Elsevier Interactive Patient Education [employer]   You are being treated with a longer course of antibiotics as I suspect your symptoms may be from a chronic prostate infection.  Take the entire course of the antibiotics.  This is a one month course.  [facility] as this needs to be the next step if your symptoms do not resolve.  I do not think your symptoms are from an std, but we will notify you if any of your cultures are positive.

## 2015-06-28 NOTE — ED Provider Notes (Signed)
CSN: [id number]     Arrival date & time 06/27/15  1915 History   First MD Initiated Contact with Patient 06/27/15 1939     Chief Complaint  Patient presents with  . Penile Discharge     (Consider location/radiation/quality/duration/timing/severity/associated sxs/prior Treatment) The history is provided by the patient.   Philip Gonzalez is a 50 y.o. male presenting with a return of his white penile discharge along with penile burning pain with urination, similar to symptoms he has experienced on multiple previous ed visits.  He denies risk factors for std's, in a monogamous relationship, partner without complaint.  His std cultures have been negative with each prior visit, most recently 2 months ago.  He does endorse temporary improvement after being treated with the prophylactic antibiotics.  Additionally his symptoms were improved longer since he was sent home with a 2 week course of doxycycline to cover for possible prostatitis at the last visit.  He denies abdominal pain, nausea, vomiting or fever.  He is a patient of the [place], but has not been able to establish with a urologist which was recommended at his last visit for this complaint.    Past Medical History  Diagnosis Date  . Acid reflux   . STD (male)   . Hemorrhoid   . Hypertension    History reviewed. No pertinent past surgical history. No family history on file. Social History  Substance Use Topics  . Smoking status: Former Smoker -- 1.00 packs/day for 14 years    Types: Cigarettes    Quit date: 08/28/2013  . Smokeless tobacco: Never Used  . Alcohol Use: No     Comment: occ    Review of Systems  Constitutional: Negative for fever.  HENT: Negative for congestion and sore throat.   Eyes: Negative.   Respiratory: Negative for chest tightness and shortness of breath.   Cardiovascular: Negative for chest pain.  Gastrointestinal: Negative for nausea and abdominal pain.  Genitourinary: Positive for dysuria, discharge and  penile pain.  Musculoskeletal: Negative for joint swelling, arthralgias and neck pain.  Skin: Negative.  Negative for rash and wound.  Neurological: Negative for dizziness, weakness, light-headedness, numbness and headaches.  Psychiatric/Behavioral: Negative.       Allergies  Bee venom and Poison oak extract  Home Medications   Prior to Admission medications   Medication Sig Start Date End Date Taking? Authorizing Provider  hydrochlorothiazide (HYDRODIURIL) 25 MG tablet Take 25 mg by mouth daily.   Yes Historical Provider, MD  Multiple Vitamin (MULTIVITAMIN WITH MINERALS) TABS tablet Take 1 tablet by mouth daily.   Yes Historical Provider, MD  omeprazole (PRILOSEC) 20 MG capsule Take 20 mg by mouth daily.   Yes Historical Provider, MD  doxycycline (VIBRAMYCIN) 100 MG capsule Take 1 capsule (100 mg total) by mouth 2 (two) times daily. Patient not taking: Reported on 06/04/2015 04/24/15   Burgess Amor, PA-C  hydrocortisone (ANUSOL-HC) 2.5 % rectal cream Apply to peri rectal area 2 times daily Patient not taking: Reported on 04/24/2015 10/23/14   Burgess Amor, PA-C  hydrocortisone (ANUSOL-HC) 25 MG suppository Place 1 suppository (25 mg total) rectally 2 (two) times daily. Patient not taking: Reported on 06/04/2015 10/23/14   Burgess Amor, PA-C  liver oil-zinc oxide (DESITIN) 40 % ointment Apply 1 application topically as needed for irritation. Patient not taking: Reported on 06/04/2015 11/10/14   Azalia Bilis, MD  predniSONE (DELTASONE) 10 MG tablet 6, 5, 4, 3, 2 then 1 tablet by mouth daily for 6 days  total. Patient not taking: Reported on 04/24/2015 10/23/14   Burgess Amor, PA-C  sulfamethoxazole-trimethoprim (BACTRIM DS,SEPTRA DS) 800-160 MG tablet Take 1 tablet by mouth 2 (two) times daily. 06/27/15 07/04/15  Burgess Amor, PA-C   BP 139/88 mmHg  Pulse 76  Temp(Src) 97.8 F (36.6 C) (Oral)  Resp 20  Ht  (1.676 m)  Wt 86.183 kg  BMI 30.68 kg/m2  SpO2 98% Physical Exam  Constitutional: He  appears well-developed and well-nourished.  HENT:  Head: Normocephalic and atraumatic.  Eyes: Conjunctivae are normal.  Neck: Normal range of motion.  Cardiovascular: Normal rate, regular rhythm, normal heart sounds and intact distal pulses.   Pulmonary/Chest: Effort normal and breath sounds normal. He has no wheezes.  Abdominal: Soft. Bowel sounds are normal. There is no tenderness.  Genitourinary: Testes normal and penis normal. Circumcised. No penile erythema. No discharge found.  No obvious penile discharge, culture was obtained from urethra.  Prostate not re-examined today.  Was enlarged, nontender at last exam.  Chaperone was present during exam.   Musculoskeletal: Normal range of motion.  Neurological: He is alert.  Skin: Skin is warm and dry.  Psychiatric: He has a normal mood and affect.  Nursing note and vitals reviewed.   ED Course  Procedures (including critical care time) Labs Review Labs Reviewed  URINALYSIS, ROUTINE W REFLEX MICROSCOPIC (NOT AT [facility])  HIV ANTIBODY (ROUTINE TESTING)  RPR  GC/CHLAMYDIA PROBE AMP () NOT AT [facility]    Imaging Review No results found. I have personally reviewed and evaluated these images and lab results as part of my medical decision-making.   EKG Interpretation None      MDM   Final diagnoses:  Dysuria  Penile discharge    Pt with intermittent sx suggesting urethritis but always with negative cultures.  He was strongly encouraged to establish urology care, referral given in event he is unable to be seen by urology with the [facility].  He was placed on a 30 day course of bactrim to cover for possible non gonococcal prostatitis.     Burgess Amor, PA-C 06/28/15 0044  Raeford Razor, MD 06/28/15 [phone]

## 2015-06-29 LAB — SYPHILIS: RPR W/REFLEX TO RPR TITER AND TREPONEMAL ANTIBODIES, TRADITIONAL SCREENING AND DIAGNOSIS ALGORITHM: RPR Ser Ql: NONREACTIVE

## 2015-06-29 LAB — HIV ANTIBODY (ROUTINE TESTING W REFLEX): HIV Screen 4th Generation wRfx: NONREACTIVE

## 2015-06-29 LAB — GC/CHLAMYDIA PROBE AMP (~~LOC~~) NOT AT ARMC
Chlamydia: NEGATIVE
Neisseria Gonorrhea: NEGATIVE

## 2015-08-07 ENCOUNTER — Encounter (HOSPITAL_COMMUNITY): Payer: Self-pay | Admitting: *Deleted

## 2015-08-07 ENCOUNTER — Emergency Department (HOSPITAL_COMMUNITY)
Admission: EM | Admit: 2015-08-07 | Discharge: 2015-08-07 | Disposition: A | Discharge status: Home / Self Care | Payer: BLUE CROSS/BLUE SHIELD | Attending: Dermatology | Admitting: Dermatology

## 2015-08-07 DIAGNOSIS — Z5321 Procedure and treatment not carried out due to patient leaving prior to being seen by health care provider: Secondary | ICD-10-CM | POA: Diagnosis not present

## 2015-08-07 DIAGNOSIS — I10 Essential (primary) hypertension: Secondary | ICD-10-CM | POA: Insufficient documentation

## 2015-08-07 DIAGNOSIS — S01111A Laceration without foreign body of right eyelid and periocular area, initial encounter: Secondary | ICD-10-CM | POA: Insufficient documentation

## 2015-08-07 DIAGNOSIS — Y999 Unspecified external cause status: Secondary | ICD-10-CM | POA: Diagnosis not present

## 2015-08-07 DIAGNOSIS — Y939 Activity, unspecified: Secondary | ICD-10-CM | POA: Diagnosis not present

## 2015-08-07 DIAGNOSIS — Y929 Unspecified place or not applicable: Secondary | ICD-10-CM | POA: Diagnosis not present

## 2015-08-07 DIAGNOSIS — F17210 Nicotine dependence, cigarettes, uncomplicated: Secondary | ICD-10-CM | POA: Diagnosis not present
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
## 2015-08-07 NOTE — ED Notes (Signed)
Pt upset about the wait time and states he was leaving.

## 2015-11-27 ENCOUNTER — Emergency Department (HOSPITAL_COMMUNITY): Payer: Self-pay

## 2015-11-27 ENCOUNTER — Emergency Department (HOSPITAL_COMMUNITY)
Admission: EM | Admit: 2015-11-27 | Discharge: 2015-11-27 | Disposition: A | Discharge status: Home / Self Care | Payer: Self-pay | Attending: Emergency Medicine | Admitting: Emergency Medicine

## 2015-11-27 ENCOUNTER — Encounter (HOSPITAL_COMMUNITY): Payer: Self-pay

## 2015-11-27 DIAGNOSIS — F17210 Nicotine dependence, cigarettes, uncomplicated: Secondary | ICD-10-CM | POA: Insufficient documentation

## 2015-11-27 DIAGNOSIS — N342 Other urethritis: Secondary | ICD-10-CM | POA: Insufficient documentation

## 2015-11-27 DIAGNOSIS — Z79899 Other long term (current) drug therapy: Secondary | ICD-10-CM | POA: Insufficient documentation

## 2015-11-27 DIAGNOSIS — M25511 Pain in right shoulder: Secondary | ICD-10-CM | POA: Insufficient documentation

## 2015-11-27 DIAGNOSIS — I10 Essential (primary) hypertension: Secondary | ICD-10-CM | POA: Insufficient documentation

## 2015-11-27 LAB — URINALYSIS, ROUTINE W REFLEX MICROSCOPIC
Glucose, UA: NEGATIVE mg/dL
NITRITE: NEGATIVE
PH: 6 (ref 5.0–8.0)
PROTEIN: 100 mg/dL — AB
Specific Gravity, Urine: >1.03 — ABNORMAL HIGH (ref 1.005–1.030)

## 2015-11-27 LAB — URINE MICROSCOPIC-ADD ON

## 2015-11-27 MED ORDER — HYDROCODONE-ACETAMINOPHEN 5-325 MG PO TABS
1.0000 | ORAL_TABLET | Freq: Once | ORAL | Status: AC
  Administered 2015-11-27: 1 via ORAL
  Filled 2015-11-27: qty 1

## 2015-11-27 MED ORDER — CEFTRIAXONE SODIUM 250 MG IJ SOLR
250.0000 mg | Freq: Once | INTRAMUSCULAR | Status: AC
  Administered 2015-11-27: 250 mg via INTRAMUSCULAR
  Filled 2015-11-27: qty 250

## 2015-11-27 MED ORDER — LIDOCAINE HCL (PF) 1 % IJ SOLN
INTRAMUSCULAR | Status: AC
  Administered 2015-11-27: 1 mL
  Filled 2015-11-27: qty 5

## 2015-11-27 MED ORDER — AZITHROMYCIN 250 MG PO TABS
1000.0000 mg | ORAL_TABLET | Freq: Once | ORAL | Status: AC
  Administered 2015-11-27: 1000 mg via ORAL
  Filled 2015-11-27: qty 4

## 2015-11-27 MED ORDER — IBUPROFEN 800 MG PO TABS
800.0000 mg | ORAL_TABLET | Freq: Once | ORAL | Status: AC
  Administered 2015-11-27: 800 mg via ORAL
  Filled 2015-11-27: qty 1

## 2015-11-27 MED ORDER — IBUPROFEN 800 MG PO TABS: 800.0000 mg | ORAL_TABLET | Freq: Three times a day (TID) | ORAL | Status: DC

## 2015-11-27 MED ORDER — HYDROCODONE-ACETAMINOPHEN 5-325 MG PO TABS: ORAL_TABLET | ORAL | Status: DC

## 2015-11-27 NOTE — ED Provider Notes (Signed)
CSN: [id number]     Arrival date & time 11/27/15  1900 History   First MD Initiated Contact with Patient 11/27/15 1930     Chief Complaint  Patient presents with  . SEXUALLY TRANSMITTED DISEASE     (Consider location/radiation/quality/duration/timing/severity/associated sxs/prior Treatment) HPI  Janeece FittingOscar G Sow is a 50 y.o. male who presents to the Emergency Department complaining of penile discharge for nearly one month.  He describes a green discharge that is also present in his underwear at times.  He complains of pain and burning with urination as well.  He states that he was treated briefly with Cipro that was given to him while incarcerated. He also complains of right shoulder pain for one week.  He denies known injury, but reports pain with movement.  He has not tried any therapies for his symptoms.  He denies fever, abd pain, hematuria, vomiting, and genital lesions.   Past Medical History  Diagnosis Date  . Acid reflux   . STD (male)   . Hemorrhoid   . Hypertension    History reviewed. No pertinent past surgical history. History reviewed. No pertinent family history. Social History  Substance Use Topics  . Smoking status: Current Every Day Smoker -- 1.00 packs/day for 14 years    Types: Cigarettes    Last Attempt to Quit: 08/28/2013  . Smokeless tobacco: Never Used  . Alcohol Use: Yes     Comment: occ    Review of Systems  Constitutional: Negative for fever and chills.  Gastrointestinal: Negative for nausea, vomiting and abdominal pain.  Genitourinary: Positive for discharge. Negative for dysuria, hematuria, penile swelling, scrotal swelling, difficulty urinating, genital sores, penile pain and testicular pain.  Musculoskeletal: Positive for joint swelling and arthralgias.  Skin: Negative for color change and wound.  All other systems reviewed and are negative.     Allergies  Bee venom and Poison oak extract  Home Medications   Prior to Admission medications    Medication Sig Start Date End Date Taking? Authorizing Provider  hydrochlorothiazide (HYDRODIURIL) 25 MG tablet Take 25 mg by mouth daily.   Yes Historical Provider, MD  Multiple Vitamin (MULTIVITAMIN WITH MINERALS) TABS tablet Take 1 tablet by mouth daily.   Yes Historical Provider, MD  omeprazole (PRILOSEC) 20 MG capsule Take 20 mg by mouth daily.   Yes Historical Provider, MD   BP 152/92 mmHg  Pulse 101  Temp(Src) 99.5 F (37.5 C) (Tympanic)  Resp 18  Ht 5\' 6"  (1.676 m)  Wt 81.647 kg  BMI 29.07 kg/m2  SpO2 98% Physical Exam  Constitutional: He appears well-developed and well-nourished. No distress.  HENT:  Head: Atraumatic.  Neck: Normal range of motion.  Cardiovascular: Normal rate, regular rhythm and intact distal pulses.   Pulmonary/Chest: Effort normal and breath sounds normal. No respiratory distress.  Abdominal: Soft. He exhibits no distension. There is no tenderness. There is no rebound. Hernia confirmed negative in the right inguinal area and confirmed negative in the left inguinal area.  Genitourinary: Testes normal. Right testis shows no mass, no swelling and no tenderness. Left testis shows no mass, no swelling and no tenderness. Circumcised.  Exam chaperoned by nursing. No obvious penile discharge, no inguinal hernias, no genital lesions  Musculoskeletal: He exhibits tenderness (diffuse ttp of the anterior and posterior right shoulder.  pain reproduced with abduction.  no erythema or edema.  sensation intact). He exhibits no edema.  Skin: Skin is warm and dry. No rash noted.  Psychiatric: He has a normal  mood and affect.  Nursing note and vitals reviewed.   ED Course  Procedures (including critical care time) Labs Review Labs Reviewed  URINALYSIS, ROUTINE W REFLEX MICROSCOPIC (NOT AT [facility]) - Abnormal; Notable for the following:    Specific Gravity, Urine >1.030 (*)    Hgb urine dipstick SMALL (*)    Bilirubin Urine SMALL (*)    Ketones, ur TRACE (*)     Protein, ur 100 (*)    Leukocytes, UA MODERATE (*)    All other components within normal limits  URINE MICROSCOPIC-ADD ON - Abnormal; Notable for the following:    Squamous Epithelial / LPF 0-5 (*)    Bacteria, UA FEW (*)    All other components within normal limits  URINE CULTURE  RPR  GC/CHLAMYDIA PROBE AMP ([place]) NOT AT [facility]    Imaging Review Dg Shoulder Right  11/27/2015  CLINICAL DATA:  50 year old male with right shoulder pain. EXAM: RIGHT SHOULDER - 2+ VIEW COMPARISON:  None. FINDINGS: There is no evidence of fracture or dislocation. There is no evidence of arthropathy or other focal bone abnormality. Soft tissues are unremarkable. IMPRESSION: Negative. Electronically Signed   By: Elgie CollardArash  Radparvar M.D.   On: 11/27/2015 20:26   I have personally reviewed and evaluated these images and lab results as part of my medical decision-making.   EKG Interpretation None      MDM   Final diagnoses:  Urethritis  Right shoulder pain    Cultures pending. Patient treated with IM Rocephin and by mouth Zithromax. Well appearing. No concerning symptoms for septic joint. Shoulder pain likely inflammatory bursitis. Remains neurovascularly intact. Patient agrees to symptomatic treatment for shoulder pain, orthopedic referral given.     Pauline Ausammy Rufus Cypert, PA-C 11/28/15 2326  Vanetta MuldersScott Zackowski, MD 11/30/15 2221

## 2015-11-27 NOTE — Discharge Instructions (Signed)
Shoulder Pain The shoulder is the joint that connects your arm to your body. Muscles and band-like tissues that connect bones to muscles (tendons) hold the joint together. Shoulder pain is felt if an injury or medical problem affects one or more parts of the shoulder. HOME CARE   Put ice on the sore area.  Put ice in a plastic bag.  Place a towel between your skin and the bag.  Leave the ice on for 15-20 minutes, 03-04 times a day for the first 2 days.  Stop using cold packs if they do not help with the pain.  If you were given something to keep your shoulder from moving (sling; shoulder immobilizer), wear it as told. Only take it off to shower or bathe.  Move your arm as little as possible, but keep your hand moving to prevent puffiness (swelling).  Squeeze a soft ball or foam pad as much as possible to help prevent swelling.  Take medicine as told by your doctor. GET HELP IF:  You have progressing new pain in your arm, hand, or fingers.  Your hand or fingers get cold.  Your medicine does not help lessen your pain. GET HELP RIGHT AWAY IF:   Your arm, hand, or fingers are numb or tingling.  Your arm, hand, or fingers are puffy (swollen), painful, or turn white or blue. MAKE SURE YOU:   Understand these instructions.  Will watch your condition.  Will get help right away if you are not doing well or get worse.   This information is not intended to replace advice given to you by your health care provider. Make sure you discuss any questions you have with your health care provider.   Document Released: 11/02/2007 Document Revised: 06/06/2014 Document Reviewed: 09/08/2014 Elsevier Interactive Patient Education 2016 [employer]  Urethritis, Adult Urethritis is an inflammation of the tube through which urine exits your bladder (urethra).  CAUSES Urethritis is often caused by an infection in your urethra. The infection can be viral, like herpes. The infection can also be  bacterial, like gonorrhea. RISK FACTORS Risk factors of urethritis include:  Having sex without using a condom.  Having multiple sexual partners.  Having poor hygiene. SIGNS AND SYMPTOMS Symptoms of urethritis are less noticeable in women than in men. These symptoms include:  Burning feeling when you urinate (dysuria).  Discharge from your urethra.  Blood in your urine (hematuria).  Urinating more than usual. DIAGNOSIS  To confirm a diagnosis of urethritis, your health care provider will do the following:  Ask about your sexual history.  Perform a physical exam.  Have you provide a sample of your urine for lab testing.  Use a cotton swab to gently collect a sample from your urethra for lab testing. TREATMENT  It is important to treat urethritis. Depending on the cause, untreated urethritis may lead to serious genital infections and possibly infertility. Urethritis caused by a bacterial infection is treated with antibiotic medicine. All sexual partners must be treated.  HOME CARE INSTRUCTIONS  Do not have sex until the test results are known and treatment is completed, even if your symptoms go away before you finish treatment.  If you were prescribed an antibiotic, finish it all even if you start to feel better. SEEK MEDICAL CARE IF:   Your symptoms are not improved in 3 days.  Your symptoms are getting worse.  You develop abdominal pain or pelvic pain (in women).  You develop joint pain.  You have a fever. SEEK  IMMEDIATE MEDICAL CARE IF:   You have severe pain in the belly, back, or side.  You have repeated vomiting. MAKE SURE YOU:  Understand these instructions.  Will watch your condition.  Will get help right away if you are not doing well or get worse.   This information is not intended to replace advice given to you by your health care provider. Make sure you discuss any questions you have with your health care provider.   Document Released:  11/09/2000 Document Revised: 09/30/2014 Document Reviewed: 01/14/2013 Elsevier Interactive Patient Education [employer] Elsevier Inc.

## 2015-11-27 NOTE — ED Notes (Signed)
C/o green penial discharge X1 month. With painful urination, also c/o right shoulder pain.

## 2015-11-29 LAB — URINE CULTURE: Culture: NO GROWTH

## 2015-11-29 LAB — RPR: RPR: NONREACTIVE

## 2015-11-30 LAB — GC/CHLAMYDIA PROBE AMP (~~LOC~~) NOT AT ARMC
Chlamydia: NEGATIVE
Neisseria Gonorrhea: POSITIVE — AB

## 2015-12-01 ENCOUNTER — Telehealth (HOSPITAL_BASED_OUTPATIENT_CLINIC_OR_DEPARTMENT_OTHER): Payer: Self-pay | Admitting: Emergency Medicine

## 2016-02-18 ENCOUNTER — Emergency Department (HOSPITAL_COMMUNITY)
Admission: EM | Admit: 2016-02-18 | Discharge: 2016-02-18 | Disposition: A | Discharge status: Home / Self Care | Payer: Self-pay | Attending: Emergency Medicine | Admitting: Emergency Medicine

## 2016-02-18 ENCOUNTER — Encounter (HOSPITAL_COMMUNITY): Payer: Self-pay | Admitting: Cardiology

## 2016-02-18 ENCOUNTER — Emergency Department (HOSPITAL_COMMUNITY): Payer: Self-pay

## 2016-02-18 DIAGNOSIS — Z79899 Other long term (current) drug therapy: Secondary | ICD-10-CM | POA: Insufficient documentation

## 2016-02-18 DIAGNOSIS — Z7982 Long term (current) use of aspirin: Secondary | ICD-10-CM | POA: Insufficient documentation

## 2016-02-18 DIAGNOSIS — I10 Essential (primary) hypertension: Secondary | ICD-10-CM | POA: Insufficient documentation

## 2016-02-18 DIAGNOSIS — Z87891 Personal history of nicotine dependence: Secondary | ICD-10-CM | POA: Insufficient documentation

## 2016-02-18 DIAGNOSIS — Z791 Long term (current) use of non-steroidal anti-inflammatories (NSAID): Secondary | ICD-10-CM | POA: Insufficient documentation

## 2016-02-18 DIAGNOSIS — M7532 Calcific tendinitis of left shoulder: Secondary | ICD-10-CM | POA: Insufficient documentation

## 2016-02-18 MED ORDER — CYCLOBENZAPRINE HCL 10 MG PO TABS: 10.0000 mg | ORAL_TABLET | Freq: Three times a day (TID) | ORAL | 0 refills | Status: DC

## 2016-02-18 MED ORDER — ONDANSETRON 4 MG PO TBDP
4.0000 mg | ORAL_TABLET | Freq: Once | ORAL | Status: AC
  Administered 2016-02-18: 4 mg via ORAL
  Filled 2016-02-18: qty 1

## 2016-02-18 MED ORDER — KETOROLAC TROMETHAMINE 10 MG PO TABS
10.0000 mg | ORAL_TABLET | Freq: Once | ORAL | Status: AC
  Administered 2016-02-18: 10 mg via ORAL
  Filled 2016-02-18: qty 1

## 2016-02-18 MED ORDER — DIAZEPAM 5 MG PO TABS
10.0000 mg | ORAL_TABLET | Freq: Once | ORAL | Status: AC
  Administered 2016-02-18: 10 mg via ORAL
  Filled 2016-02-18: qty 2

## 2016-02-18 MED ORDER — DICLOFENAC SODIUM 75 MG PO TBEC: 75.0000 mg | DELAYED_RELEASE_TABLET | Freq: Two times a day (BID) | ORAL | 0 refills | Status: DC

## 2016-02-18 MED ORDER — TRAMADOL HCL 50 MG PO TABS
100.0000 mg | ORAL_TABLET | Freq: Once | ORAL | Status: AC
  Administered 2016-02-18: 100 mg via ORAL
  Filled 2016-02-18: qty 2

## 2016-02-18 MED ORDER — DEXAMETHASONE 4 MG PO TABS: 4.0000 mg | ORAL_TABLET | Freq: Two times a day (BID) | ORAL | 0 refills | Status: DC

## 2016-02-18 MED ORDER — TRAMADOL HCL 50 MG PO TABS: 50.0000 mg | ORAL_TABLET | Freq: Four times a day (QID) | ORAL | 0 refills | Status: DC | PRN

## 2016-02-18 NOTE — ED Triage Notes (Signed)
C/o left arm pain times 3-4 days.  Pain is worse with movement.

## 2016-02-18 NOTE — ED Provider Notes (Signed)
[facility] Provider Note   CSN: [id number] Arrival date & time: 02/18/16  1309     History   Chief Complaint No chief complaint on file.   HPI Philip Gonzalez is a 50 y.o. male.  Patient is a 50 year old male who presents to the emergency department with a complaint of left shoulder pain.  Patient states that for the past 3 or 4 days he's been having pain in the left shoulder extending into the neck. This morning he says he could not move his shoulder without excruciating pain. He's not had any recent injury or trauma. He has not had any recent operations or procedures involving the left shoulder. He does not recall doing any heavy lifting, pushing, or pulling. 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 Movement or attempting to lift anything causes severe pain. Resting the shoulder helps some, but does not alleviate the discomfort.   The history is provided by the patient.    Past Medical History:  Diagnosis Date  . Acid reflux   . Hemorrhoid   . Hypertension   . STD (male)     There are no active problems to display for this patient.   No past surgical history on file.     Home Medications    Prior to Admission medications   Medication Sig Start Date End Date Taking? Authorizing Provider  aspirin 325 MG tablet Take 650 mg by mouth once as needed.   Yes Historical Provider, MD  hydrochlorothiazide (HYDRODIURIL) 25 MG tablet Take 25 mg by mouth daily.   Yes Historical Provider, MD  ibuprofen (ADVIL,MOTRIN) 800 MG tablet Take 1 tablet (800 mg total) by mouth 3 (three) times daily. 11/27/15  Yes Tammy Triplett, PA-C  omeprazole (PRILOSEC) 20 MG capsule Take 20 mg by mouth daily.   Yes Historical Provider, MD    Family History No family history on file.  Social History Social History  Substance Use Topics  . Smoking status: Former Smoker    Packs/day: 1.00    Years:  14.00    Types: Cigarettes    Quit date: 01/18/2016  . Smokeless tobacco: Never Used  . Alcohol use Yes     Comment: occ     Allergies   Bee venom and Poison oak extract [extract of poison oak]   Review of Systems Review of Systems  Constitutional: Negative for activity change.       All ROS Neg except as noted in HPI  HENT: Negative for nosebleeds.   Eyes: Negative for photophobia and discharge.  Respiratory: Negative for cough, shortness of breath and wheezing.   Cardiovascular: Negative for chest pain and palpitations.  Gastrointestinal: Negative for abdominal pain and blood in stool.  Genitourinary: Negative for dysuria, frequency and hematuria.  Musculoskeletal: Positive for arthralgias. Negative for back pain and neck pain.  Skin: Negative.   Neurological: Negative for dizziness, seizures and speech difficulty.  Psychiatric/Behavioral: Negative for confusion and hallucinations.     Physical Exam Updated Vital Signs BP 132/100 (BP Location: Left Arm)   Temp 98.1 F (36.7 C) (Oral)   Resp 20   Ht 5\' 6"  (1.676 m)   Wt 79.4 kg   SpO2 95%   BMI 28.25 kg/m   Physical Exam  Constitutional: He is oriented to person, place, and time. He appears well-developed and well-nourished.  Non-toxic appearance.  HENT:  Head: Normocephalic.  Right Ear:  Tympanic membrane and external ear normal.  Left Ear: Tympanic membrane and external ear normal.  Eyes: EOM and lids are normal. Pupils are equal, round, and reactive to light.  Neck: Normal range of motion. Neck supple. Carotid bruit is not present.  Cardiovascular: Normal rate, regular rhythm, normal heart sounds, intact distal pulses and normal pulses.   Pulmonary/Chest: Breath sounds normal. No respiratory distress.  Abdominal: Soft. Bowel sounds are normal. There is no tenderness. There is no guarding.  Musculoskeletal: He exhibits tenderness.  There is tightness and tenseness of the left trapezius area.  Pain to palpation  of the anterior left shoulder. The shoulder is not hot. Pt will not cooperate for ROM exam. FROM of the right elbow and wrist. Radial pulse 2+  Lymphadenopathy:       Head (right side): No submandibular adenopathy present.       Head (left side): No submandibular adenopathy present.    He has no cervical adenopathy.  Neurological: He is alert and oriented to person, place, and time. He has normal strength. No cranial nerve deficit or sensory deficit.  Skin: Skin is warm and dry.  Psychiatric: He has a normal mood and affect. His speech is normal.  Nursing note and vitals reviewed.    ED Treatments / Results  Labs (all labs ordered are listed, but only abnormal results are displayed) Labs Reviewed - No data to display  EKG  EKG Interpretation None       Radiology No results found.  Procedures Procedures (including critical care time)  Medications Ordered in ED Medications  diazepam (VALIUM) tablet 10 mg (not administered)  ketorolac (TORADOL) tablet 10 mg (not administered)  traMADol (ULTRAM) tablet 100 mg (not administered)  ondansetron (ZOFRAN-ODT) disintegrating tablet 4 mg (not administered)     Initial Impression / Assessment and Plan / ED Course  I have reviewed the triage vital signs and the nursing notes.  Pertinent labs & imaging results that were available during my care of the patient were reviewed by me and considered in my medical decision making (see chart for details).  Clinical Course    **I have reviewed nursing notes, vital signs, and all appropriate lab and imaging results for this patient.*  Final Clinical Impressions(s) / ED Diagnoses  Exam and x ray suggest calcific tendonitis. Pt placed in a sling. Referral to Dr Romeo AppleHarrison for orthopedic evaluation. Rx for flexeril, diclofenac, and decadron given to the patient. He is in agreement with this plan.   Final diagnoses:  Calcific tendonitis of left shoulder    New Prescriptions New  Prescriptions   No medications on file     Ivery QualeHobson Palmer Shorey, PA-C 02/18/16 1650    Maia PlanJoshua G Long, MD 02/19/16 1345

## 2016-02-18 NOTE — Discharge Instructions (Signed)
Heating pad to your shoulder will be helpful. Please use Decadron and diclofenac 2 times daily with food. Use Flexeril 3 times daily for spasm type pain. Use Ultram every 6 hours if needed for more severe pain. Flexeril and Ultram may cause drowsiness, please do not drive, operate machinery, drink alcohol, or participate in activities requiring concentration when taking this medication. Please call Dr. Romeo AppleHarrison for orthopedic evaluation and management of this shoulder issue.

## 2016-03-30 ENCOUNTER — Encounter (HOSPITAL_COMMUNITY): Payer: Self-pay

## 2016-03-30 ENCOUNTER — Emergency Department (HOSPITAL_COMMUNITY)
Admission: EM | Admit: 2016-03-30 | Discharge: 2016-03-30 | Disposition: A | Discharge status: Home / Self Care | Payer: Self-pay | Attending: Emergency Medicine | Admitting: Emergency Medicine

## 2016-03-30 DIAGNOSIS — Z7982 Long term (current) use of aspirin: Secondary | ICD-10-CM | POA: Insufficient documentation

## 2016-03-30 DIAGNOSIS — I10 Essential (primary) hypertension: Secondary | ICD-10-CM | POA: Insufficient documentation

## 2016-03-30 DIAGNOSIS — Z87891 Personal history of nicotine dependence: Secondary | ICD-10-CM | POA: Insufficient documentation

## 2016-03-30 DIAGNOSIS — N342 Other urethritis: Secondary | ICD-10-CM | POA: Insufficient documentation

## 2016-03-30 DIAGNOSIS — Z79899 Other long term (current) drug therapy: Secondary | ICD-10-CM | POA: Insufficient documentation

## 2016-03-30 MED ORDER — AZITHROMYCIN 250 MG PO TABS
1000.0000 mg | ORAL_TABLET | Freq: Once | ORAL | Status: AC
  Administered 2016-03-30: 1000 mg via ORAL
  Filled 2016-03-30: qty 4

## 2016-03-30 MED ORDER — CEFTRIAXONE SODIUM 250 MG IJ SOLR
250.0000 mg | Freq: Once | INTRAMUSCULAR | Status: AC
  Administered 2016-03-30: 250 mg via INTRAMUSCULAR
  Filled 2016-03-30: qty 250

## 2016-03-30 NOTE — Discharge Instructions (Signed)
You were treated in the emergency department with intramuscular Rocephin, oral Zithromax. Please refrain from all sexual activity over the next 7 days. Please practice safe sex.

## 2016-03-30 NOTE — ED Provider Notes (Signed)
[facility] Provider Note   CSN: [id number] Arrival date & time: 03/30/16  1139     History   Chief Complaint Chief Complaint  Patient presents with  . Dysuria    HPI Philip Gonzalez is a 50 y.o. male.  The history is provided by the patient.  Dysuria   This is a new problem. The current episode started more than 1 week ago. The problem occurs every urination. The problem has been gradually worsening. The quality of the pain is described as burning. The pain is moderate. There has been no fever. He is sexually active. Associated symptoms include sweats and discharge. Pertinent negatives include no hematuria and no flank pain. He has tried nothing for the symptoms. His past medical history does not include kidney stones or recurrent UTIs.    Past Medical History:  Diagnosis Date  . Acid reflux   . Hemorrhoid   . Hypertension   . STD (male)     There are no active problems to display for this patient.   History reviewed. No pertinent surgical history.     Home Medications    Prior to Admission medications   Medication Sig Start Date End Date Taking? Authorizing Provider  hydrochlorothiazide (HYDRODIURIL) 25 MG tablet Take 25 mg by mouth daily.   Yes Historical Provider, MD  omeprazole (PRILOSEC) 20 MG capsule Take 20 mg by mouth daily.   Yes Historical Provider, MD  aspirin 325 MG tablet Take 650 mg by mouth once as needed.    Historical Provider, MD  dexamethasone (DECADRON) 4 MG tablet Take 1 tablet (4 mg total) by mouth 2 (two) times daily with a meal. 02/18/16   Philip QualeHobson Darrow Barreiro, PA-C  diclofenac (VOLTAREN) 75 MG EC tablet Take 1 tablet (75 mg total) by mouth 2 (two) times daily. 02/18/16   Philip QualeHobson Tremain Rucinski, PA-C  ibuprofen (ADVIL,MOTRIN) 800 MG tablet Take 1 tablet (800 mg total) by mouth 3 (three) times daily. 11/27/15   Philip Triplett, PA-C  traMADol (ULTRAM) 50 MG tablet Take 1 tablet (50 mg total) by mouth every 6 (six) hours as needed. 02/18/16   Philip QualeHobson Jaylanni Eltringham,  PA-C    Family History No family history on file.  Social History Social History  Substance Use Topics  . Smoking status: Former Smoker    Packs/day: 1.00    Years: 14.00    Types: Cigarettes    Quit date: 01/18/2016  . Smokeless tobacco: Never Used  . Alcohol use Yes     Comment: occ     Allergies   Bee venom and Poison oak extract [poison oak extract]   Review of Systems Review of Systems  HENT: Positive for congestion.   Genitourinary: Positive for discharge and dysuria. Negative for flank pain, hematuria, penile swelling and testicular pain.  All other systems reviewed and are negative.    Physical Exam Updated Vital Signs BP 110/90 (BP Location: Right Arm)   Pulse 85   Temp 98.3 F (36.8 C) (Temporal)   Resp 16   SpO2 98%   Physical Exam  Constitutional: Vital signs are normal. He appears well-developed and well-nourished. He is active.  HENT:  Head: Normocephalic and atraumatic.  Right Ear: Tympanic membrane, external ear and ear canal normal.  Left Ear: Tympanic membrane, external ear and ear canal normal.  Nose: Nose normal.  Mouth/Throat: Uvula is midline, oropharynx is clear and moist and mucous membranes are normal.  Eyes: Conjunctivae, EOM and lids are normal. Pupils are equal, round, and reactive  to light.  Neck: Trachea normal, normal range of motion and phonation normal. Neck supple. Carotid bruit is not present.  Cardiovascular: Normal rate, regular rhythm and normal pulses.   Abdominal: Soft. Normal appearance and bowel sounds are normal. Hernia confirmed negative in the right inguinal area and confirmed negative in the left inguinal area.  Genitourinary: Testes normal. Circumcised. Discharge found.  Lymphadenopathy:       Head (right side): No submental, no preauricular and no posterior auricular adenopathy present.       Head (left side): No submental, no preauricular and no posterior auricular adenopathy present.    He has no cervical  adenopathy. No inguinal adenopathy noted on the right or left side.  Neurological: He is alert. He has normal strength. No cranial nerve deficit or sensory deficit. GCS eye subscore is 4. GCS verbal subscore is 5. GCS motor subscore is 6.  Skin: Skin is warm and dry.  Psychiatric: His speech is normal.     ED Treatments / Results  Labs (all labs ordered are listed, but only abnormal results are displayed) Labs Reviewed  RPR  HIV ANTIBODY (ROUTINE TESTING)  GC/CH[facility]BE AMP () [facility]    EKG  EKG Interpretation None       Radiology No results found.  Procedures Procedures (including critical care time)  Medications Ordered in ED Medications - No data to display   Initial Impression / Assessment and Plan / ED Course  I have reviewed the triage vital signs and the nursing notes.  Pertinent labs & imaging results that were available during my care of the patient were reviewed by me and considered in my medical decision making (see chart for details).  Clinical Course    *I have reviewed nursing notes, vital signs, and all appropriate lab and imaging results for this patient.**  Final Clinical Impressions(s) / ED Diagnoses  Vital signs within normal limits. Patient complains of burning and pain with each urination. He complains of penile discharge. Patient treated in the emergency department with intramuscular Rocephin and oral Zithromax. Cultures sent to the lab. We discussed the importance of safe sex. We also discussed on reports of refraining from all sexual activity over the next 7 days. Patient is in agreement with this discharge plan. GC chlamydia testing been sent to the lab. RPR and HIV also sent to the lab.    Final diagnoses:  None    New Prescriptions New Prescriptions   No medications on file     Philip QualeHobson Robinson Brinkley, PA-C 03/30/16 1407    Philip CoreNathan Pickering, MD 03/30/16 1536

## 2016-03-30 NOTE — ED Triage Notes (Signed)
Reports of dysuria and green penile discharge x1 week.

## 2016-03-31 LAB — GC/CHLAMYDIA PROBE AMP (~~LOC~~) NOT AT ARMC
Chlamydia: NEGATIVE
Neisseria Gonorrhea: POSITIVE — AB

## 2016-03-31 LAB — RPR: RPR: NONREACTIVE

## 2016-03-31 LAB — HIV ANTIBODY (ROUTINE TESTING W REFLEX): HIV SCREEN 4TH GENERATION: NONREACTIVE

## 2016-04-01 ENCOUNTER — Telehealth (HOSPITAL_COMMUNITY): Payer: Self-pay

## 2016-04-01 NOTE — Telephone Encounter (Signed)
[facility]  (+) gonorrhea Pt treated with Zithromax and Rocephin.  Pt ID verified x 2.  Pt informed of dx, tx rcvd appropriate, notify partner & abstain from sex x 10 days.  [employer]HS form completed and faxed.

## 2016-06-26 ENCOUNTER — Emergency Department (HOSPITAL_COMMUNITY)
Admission: EM | Admit: 2016-06-26 | Discharge: 2016-06-26 | Disposition: A | Discharge status: Home / Self Care | Payer: Self-pay | Attending: Emergency Medicine | Admitting: Emergency Medicine

## 2016-06-26 ENCOUNTER — Encounter (HOSPITAL_COMMUNITY): Payer: Self-pay | Admitting: Emergency Medicine

## 2016-06-26 DIAGNOSIS — Y999 Unspecified external cause status: Secondary | ICD-10-CM | POA: Insufficient documentation

## 2016-06-26 DIAGNOSIS — R369 Urethral discharge, unspecified: Secondary | ICD-10-CM

## 2016-06-26 DIAGNOSIS — R360 Urethral discharge without blood: Secondary | ICD-10-CM | POA: Insufficient documentation

## 2016-06-26 DIAGNOSIS — Y939 Activity, unspecified: Secondary | ICD-10-CM | POA: Insufficient documentation

## 2016-06-26 DIAGNOSIS — I10 Essential (primary) hypertension: Secondary | ICD-10-CM | POA: Insufficient documentation

## 2016-06-26 DIAGNOSIS — Y929 Unspecified place or not applicable: Secondary | ICD-10-CM | POA: Insufficient documentation

## 2016-06-26 DIAGNOSIS — Z79899 Other long term (current) drug therapy: Secondary | ICD-10-CM | POA: Insufficient documentation

## 2016-06-26 DIAGNOSIS — S70262A Insect bite (nonvenomous), left hip, initial encounter: Secondary | ICD-10-CM | POA: Insufficient documentation

## 2016-06-26 DIAGNOSIS — W57XXXA Bitten or stung by nonvenomous insect and other nonvenomous arthropods, initial encounter: Secondary | ICD-10-CM | POA: Insufficient documentation

## 2016-06-26 DIAGNOSIS — Z7982 Long term (current) use of aspirin: Secondary | ICD-10-CM | POA: Insufficient documentation

## 2016-06-26 DIAGNOSIS — Z791 Long term (current) use of non-steroidal anti-inflammatories (NSAID): Secondary | ICD-10-CM | POA: Insufficient documentation

## 2016-06-26 LAB — URINALYSIS, ROUTINE W REFLEX MICROSCOPIC
BILIRUBIN URINE: NEGATIVE
Glucose, UA: NEGATIVE mg/dL
Hgb urine dipstick: NEGATIVE
KETONES UR: NEGATIVE mg/dL
Leukocytes, UA: NEGATIVE
NITRITE: NEGATIVE
PH: 7 (ref 5.0–8.0)
Protein, ur: NEGATIVE mg/dL
Specific Gravity, Urine: 1.019 (ref 1.005–1.030)

## 2016-06-26 MED ORDER — DIPHENHYDRAMINE HCL 25 MG PO TABS: 25.0000 mg | ORAL_TABLET | Freq: Four times a day (QID) | ORAL | 0 refills | Status: DC | PRN

## 2016-06-26 MED ORDER — LIDOCAINE HCL (PF) 1 % IJ SOLN
INTRAMUSCULAR | Status: AC
  Administered 2016-06-26: 5 mL
  Filled 2016-06-26: qty 5

## 2016-06-26 MED ORDER — CEFTRIAXONE SODIUM 250 MG IJ SOLR
250.0000 mg | Freq: Once | INTRAMUSCULAR | Status: AC
  Administered 2016-06-26: 250 mg via INTRAMUSCULAR
  Filled 2016-06-26: qty 250

## 2016-06-26 MED ORDER — AZITHROMYCIN 250 MG PO TABS
1000.0000 mg | ORAL_TABLET | Freq: Once | ORAL | Status: AC
  Administered 2016-06-26: 1000 mg via ORAL
  Filled 2016-06-26: qty 4

## 2016-06-26 NOTE — ED Triage Notes (Signed)
Pt states possible bug bite to left hip. Has been itching ever since. Now is having difficulty urinating. Denies penile or testicle swelling. C/o milky white penile dc.

## 2016-06-26 NOTE — ED Provider Notes (Signed)
[facility] Provider Note   CSN: [id number] Arrival date & time: 06/26/16  1740   By signing my name below, I, Philip Gonzalez, attest that this documentation has been prepared under the direction and in the presence of Sharilyn SitesLisa Ritter Helsley, PA-C.  Electronically Signed: Cynda AcresHailei Gonzalez, Scribe. 06/26/16. 6:27 PM.  History   Chief Complaint Chief Complaint  Patient presents with  . Wound Infection  . Urinary Retention    HPI Comments: Philip Gonzalez is a 51 y.o. male with a hx of STDs, who presents to the Emergency Department complaining of a wound to the left hip that appeared a few days ago. Patient reports he may have been bit by a bug, unsure if he was. Patient has associated itching.  Also reports difficulty urinating, and milky white penile discharge. Patient states this feels like prior gonorrhea. No modifying factors indicated. He denies any penile swelling, condom breakage, unprotected sex, or any new sexual contacts.  The history is provided by the patient. No language interpreter was used.    Past Medical History:  Diagnosis Date  . Acid reflux   . Hemorrhoid   . Hypertension   . STD (male)     There are no active problems to display for this patient.   History reviewed. No pertinent surgical history.     Home Medications    Prior to Admission medications   Medication Sig Start Date End Date Taking? Authorizing Provider  aspirin 325 MG tablet Take 650 mg by mouth once as needed.    Historical Provider, MD  dexamethasone (DECADRON) 4 MG tablet Take 1 tablet (4 mg total) by mouth 2 (two) times daily with a meal. 02/18/16   Ivery QualeHobson Bryant, PA-C  diclofenac (VOLTAREN) 75 MG EC tablet Take 1 tablet (75 mg total) by mouth 2 (two) times daily. 02/18/16   Ivery QualeHobson Bryant, PA-C  hydrochlorothiazide (HYDRODIURIL) 25 MG tablet Take 25 mg by mouth daily.    Historical Provider, MD  ibuprofen (ADVIL,MOTRIN) 800 MG tablet Take 1 tablet (800 mg total) by mouth 3 (three) times  daily. 11/27/15   Tammy Triplett, PA-C  omeprazole (PRILOSEC) 20 MG capsule Take 20 mg by mouth daily.    Historical Provider, MD  traMADol (ULTRAM) 50 MG tablet Take 1 tablet (50 mg total) by mouth every 6 (six) hours as needed. 02/18/16   Ivery QualeHobson Bryant, PA-C    Family History History reviewed. No pertinent family history.  Social History Social History  Substance Use Topics  . Smoking status: Former Smoker    Packs/day: 1.00    Years: 14.00    Types: Cigarettes    Quit date: 01/18/2016  . Smokeless tobacco: Never Used  . Alcohol use Yes     Comment: occ     Allergies   Bee venom and Poison oak extract [poison oak extract]   Review of Systems Review of Systems  Constitutional: Negative for fever.  Genitourinary: Positive for difficulty urinating and discharge (Milky white).  Skin: Positive for wound.  All other systems reviewed and are negative.    Physical Exam Updated Vital Signs BP 136/84 (BP Location: Left Arm)   Pulse 92   Temp 97.6 F (36.4 C) (Oral)   Resp 16   Ht 5\' 8"  (1.727 m)   Wt 200 lb (90.7 kg)   SpO2 97%   BMI 30.41 kg/m   Physical Exam  Constitutional: He is oriented to person, place, and time. He appears well-developed and well-nourished.  HENT:  Head: Normocephalic and  atraumatic.  Mouth/Throat: Oropharynx is clear and moist.  Eyes: Conjunctivae and EOM are normal. Pupils are equal, round, and reactive to light.  Neck: Normal range of motion.  Cardiovascular: Normal rate, regular rhythm and normal heart sounds.   Pulmonary/Chest: Effort normal and breath sounds normal.  Abdominal: Soft. Bowel sounds are normal.  Musculoskeletal: Normal range of motion.  Small bug bite noted to left hip; no signs of superimposed infection or cellulitis; no lymphangitis  Neurological: He is alert and oriented to person, place, and time.  Skin: Skin is warm and dry.  Psychiatric: He has a normal mood and affect.  Nursing note and vitals reviewed.    ED  Treatments / Results  DIAGNOSTIC STUDIES: Oxygen Saturation is 97% on RA, normal by my interpretation.    COORDINATION OF CARE: 6:23 PM Discussed treatment plan with pt at bedside and pt agreed to plan which includes an STD swab.   Labs (all labs ordered are listed, but only abnormal results are displayed) Labs Reviewed - No data to display  EKG  EKG Interpretation None       Radiology No results found.  Procedures Procedures (including critical care time)  Medications Ordered in ED Medications - No data to display   Initial Impression / Assessment and Plan / ED Course  I have reviewed the triage vital signs and the nursing notes.  Pertinent labs & imaging results that were available during my care of the patient were reviewed by me and considered in my medical decision making (see chart for details).  51 year old male here with multiple complaints.  Reports bug bite of left hip. Happened a few days ago. Reports itching. No signs of superimposed infection or cellulitis on exam. We'll treat with Benadryl.  Also complains of difficulty urinating and penile discharge. History of gonorrhea with similar symptoms. UA negative.  Gc/chl pending.  Patient was treated empirically with Rocephin and azithromycin given his history. Advised he will be notified if culture results are positive.  I recommended that he follow-up closely with his primary care doctor for aforementioned complaints.  Discussed plan with patient, he acknowledged understanding and agreed with plan of care.  Return precautions given for new or worsening symptoms.  Final Clinical Impressions(s) / ED Diagnoses   Final diagnoses:  Penile discharge  Bug bite, initial encounter    New Prescriptions New Prescriptions   DIPHENHYDRAMINE (BENADRYL) 25 MG TABLET    Take 1 tablet (25 mg total) by mouth every 6 (six) hours as needed for itching (Rash).   I personally performed the services described in this  documentation, which was scribed in my presence. The recorded information has been reviewed and is accurate.   Garlon Hatchet, PA-C 06/26/16 1859    Samuel Jester, DO 06/29/16 1348

## 2016-06-26 NOTE — Discharge Instructions (Signed)
Take the prescribed medication as directed.  You will be notified if your culture results are positive but you have already been treated. Follow-up with your primary care doctor. Return to the ED for new or worsening symptoms.

## 2016-06-26 NOTE — ED Notes (Signed)
Pt states white penile d/c, denies having unprotected sex or condoms breaking.

## 2016-06-28 LAB — GC/CHLAMYDIA PROBE AMP (~~LOC~~) NOT AT ARMC
Chlamydia: NEGATIVE
NEISSERIA GONORRHEA: NEGATIVE

## 2016-07-13 ENCOUNTER — Emergency Department (HOSPITAL_COMMUNITY)
Admission: EM | Admit: 2016-07-13 | Discharge: 2016-07-13 | Disposition: A | Discharge status: Home / Self Care | Payer: Self-pay | Attending: Emergency Medicine | Admitting: Emergency Medicine

## 2016-07-13 ENCOUNTER — Encounter (HOSPITAL_COMMUNITY): Payer: Self-pay | Admitting: Emergency Medicine

## 2016-07-13 DIAGNOSIS — F17210 Nicotine dependence, cigarettes, uncomplicated: Secondary | ICD-10-CM | POA: Insufficient documentation

## 2016-07-13 DIAGNOSIS — Z79899 Other long term (current) drug therapy: Secondary | ICD-10-CM | POA: Insufficient documentation

## 2016-07-13 DIAGNOSIS — Z7982 Long term (current) use of aspirin: Secondary | ICD-10-CM | POA: Insufficient documentation

## 2016-07-13 DIAGNOSIS — I10 Essential (primary) hypertension: Secondary | ICD-10-CM | POA: Insufficient documentation

## 2016-07-13 DIAGNOSIS — F22 Delusional disorders: Secondary | ICD-10-CM | POA: Insufficient documentation

## 2016-07-13 HISTORY — DX: Post-traumatic stress disorder, unspecified: F43.10

## 2016-07-13 LAB — COMPREHENSIVE METABOLIC PANEL
ALK PHOS: 75 U/L (ref 38–126)
ALT: 39 U/L (ref 17–63)
AST: 60 U/L — ABNORMAL HIGH (ref 15–41)
Albumin: 4.8 g/dL (ref 3.5–5.0)
Anion gap: 13 (ref 5–15)
BUN: 20 mg/dL (ref 6–20)
CALCIUM: 9.5 mg/dL (ref 8.9–10.3)
CO2: 29 mmol/L (ref 22–32)
CREATININE: 1.15 mg/dL (ref 0.61–1.24)
Chloride: 96 mmol/L — ABNORMAL LOW (ref 101–111)
Glucose, Bld: 103 mg/dL — ABNORMAL HIGH (ref 65–99)
Potassium: 3.5 mmol/L (ref 3.5–5.1)
Sodium: 138 mmol/L (ref 135–145)
Total Bilirubin: 2.5 mg/dL — ABNORMAL HIGH (ref 0.3–1.2)
Total Protein: 8 g/dL (ref 6.5–8.1)

## 2016-07-13 LAB — CBC WITH DIFFERENTIAL/PLATELET
Basophils Absolute: 0 10*3/uL (ref 0.0–0.1)
Basophils Relative: 0 %
EOS ABS: 0 10*3/uL (ref 0.0–0.7)
Eosinophils Relative: 0 %
HCT: 37.9 % — ABNORMAL LOW (ref 39.0–52.0)
Hemoglobin: 12.7 g/dL — ABNORMAL LOW (ref 13.0–17.0)
Lymphocytes Relative: 8 %
Lymphs Abs: 1.3 10*3/uL (ref 0.7–4.0)
MCH: 32.2 pg (ref 26.0–34.0)
MCHC: 33.5 g/dL (ref 30.0–36.0)
MCV: 96.2 fL (ref 78.0–100.0)
MONO ABS: 1.2 10*3/uL — AB (ref 0.1–1.0)
Monocytes Relative: 8 %
Neutro Abs: 12.7 10*3/uL — ABNORMAL HIGH (ref 1.7–7.7)
Neutrophils Relative %: 84 %
Platelets: 199 10*3/uL (ref 150–400)
RBC: 3.94 MIL/uL — ABNORMAL LOW (ref 4.22–5.81)
RDW: 12.3 % (ref 11.5–15.5)
WBC: 15.1 10*3/uL — ABNORMAL HIGH (ref 4.0–10.5)

## 2016-07-13 LAB — ACETAMINOPHEN LEVEL

## 2016-07-13 LAB — RAPID URINE DRUG SCREEN, HOSP PERFORMED
Amphetamines: NOT DETECTED
BARBITURATES: NOT DETECTED
Benzodiazepines: NOT DETECTED
COCAINE: POSITIVE — AB
OPIATES: NOT DETECTED
Tetrahydrocannabinol: NOT DETECTED

## 2016-07-13 LAB — SALICYLATE LEVEL: Salicylate Lvl: <7 mg/dL (ref 2.8–30.0)

## 2016-07-13 LAB — ETHANOL

## 2016-07-13 MED ORDER — ONDANSETRON HCL 4 MG PO TABS: 4.0000 mg | ORAL_TABLET | Freq: Three times a day (TID) | ORAL | Status: DC | PRN

## 2016-07-13 MED ORDER — HYDROCHLOROTHIAZIDE 25 MG PO TABS
25.0000 mg | ORAL_TABLET | Freq: Every day | ORAL | Status: DC
  Administered 2016-07-13: 25 mg via ORAL
  Filled 2016-07-13: qty 1

## 2016-07-13 MED ORDER — ZOLPIDEM TARTRATE 5 MG PO TABS: 5.0000 mg | ORAL_TABLET | Freq: Every evening | ORAL | Status: DC | PRN

## 2016-07-13 MED ORDER — LORAZEPAM 1 MG PO TABS
0.0000 mg | ORAL_TABLET | Freq: Four times a day (QID) | ORAL | Status: DC
  Administered 2016-07-13: 2 mg via ORAL
  Filled 2016-07-13: qty 2

## 2016-07-13 MED ORDER — LORAZEPAM 1 MG PO TABS: 0.0000 mg | ORAL_TABLET | Freq: Two times a day (BID) | ORAL | Status: DC

## 2016-07-13 MED ORDER — ACETAMINOPHEN 325 MG PO TABS: 650.0000 mg | ORAL_TABLET | ORAL | Status: DC | PRN

## 2016-07-13 MED ORDER — NICOTINE 21 MG/24HR TD PT24
21.0000 mg | MEDICATED_PATCH | Freq: Every day | TRANSDERMAL | Status: DC
  Administered 2016-07-13: 21 mg via TRANSDERMAL
  Filled 2016-07-13: qty 1

## 2016-07-13 MED ORDER — PANTOPRAZOLE SODIUM 40 MG PO TBEC
40.0000 mg | DELAYED_RELEASE_TABLET | Freq: Every day | ORAL | Status: DC
  Administered 2016-07-13: 40 mg via ORAL
  Filled 2016-07-13: qty 1

## 2016-07-13 MED ORDER — IBUPROFEN 400 MG PO TABS: 600.0000 mg | ORAL_TABLET | Freq: Three times a day (TID) | ORAL | Status: DC | PRN

## 2016-07-13 MED ORDER — DIPHENHYDRAMINE HCL 25 MG PO CAPS: 25.0000 mg | ORAL_CAPSULE | Freq: Four times a day (QID) | ORAL | Status: DC | PRN

## 2016-07-13 MED ORDER — ASPIRIN 325 MG PO TABS
650.0000 mg | ORAL_TABLET | Freq: Every day | ORAL | Status: DC
  Administered 2016-07-13: 650 mg via ORAL
  Filled 2016-07-13: qty 2

## 2016-07-13 NOTE — ED Provider Notes (Signed)
[facility] DEPT Provider Note   CSN: [id number] Arrival date & time: 07/13/16  4098   By signing my name below, I, Bobbie Stack, attest that this documentation has been prepared under the direction and in the presence of Pricilla Loveless, MD. Electronically Signed: Bobbie Stack, Scribe. 07/13/16. 8:43 AM. History   Chief Complaint Chief Complaint  Patient presents with  . Paranoid     The history is provided by the patient. No language interpreter was used.  HPI Comments: Philip Gonzalez is a 51 y.o. male brought in by ambulance, who presents to the Emergency Department complaining of worsening paranoia for the past few days. He also reports feeling dehydrated and stressed. The patient states that he has not slept in a few days. The patient states that someone is trying to shoot him. He reports laying in the bathtub trying to protect himself. He believes his life is in immediate danger. He states that he has been having hallucinations and has been hearing voices that are not really there. He states that he doesn't always feel this way. He denies any suicidal ideas. He also denies any fevers, drug use, and alcohol use. He is a current smoker. He has a hx of arthritis and states that he is having right arm pain and swelling.   Past Medical History:  Diagnosis Date  . Acid reflux   . Hemorrhoid   . Hypertension   . PTSD (post-traumatic stress disorder)   . STD (male)     There are no active problems to display for this patient.   History reviewed. No pertinent surgical history.     Home Medications    Prior to Admission medications   Medication Sig Start Date End Date Taking? Authorizing Provider  aspirin 325 MG tablet Take 650 mg by mouth daily.    Yes Historical Provider, MD  diclofenac (VOLTAREN) 75 MG EC tablet Take 1 tablet (75 mg total) by mouth 2 (two) times daily. Patient taking differently: Take 75 mg by mouth daily as needed for mild pain.  02/18/16  Yes  Ivery Quale, PA-C  diphenhydrAMINE (BENADRYL) 25 MG tablet Take 1 tablet (25 mg total) by mouth every 6 (six) hours as needed for itching (Rash). 06/26/16  Yes Garlon Hatchet, PA-C  hydrochlorothiazide (HYDRODIURIL) 25 MG tablet Take 25 mg by mouth daily.   Yes Historical Provider, MD  omeprazole (PRILOSEC) 20 MG capsule Take 20 mg by mouth daily.   Yes Historical Provider, MD  traMADol (ULTRAM) 50 MG tablet Take 1 tablet (50 mg total) by mouth every 6 (six) hours as needed. 02/18/16  Yes Ivery Quale, PA-C    Family History No family history on file.  Social History Social History  Substance Use Topics  . Smoking status: Smoker, Current Status Unknown    Packs/day: 1.00    Years: 14.00    Types: Cigarettes    Last attempt to quit: 01/18/2016  . Smokeless tobacco: Never Used  . Alcohol use Yes     Comment: occ     Allergies   Bee venom and Poison oak extract [poison oak extract]   Review of Systems Review of Systems  Constitutional: Negative for fever.  Cardiovascular: Negative for leg swelling.  Psychiatric/Behavioral: Positive for hallucinations. Negative for suicidal ideas.  All other systems reviewed and are negative.    Physical Exam Updated Vital Signs BP (!) 155/108   Pulse 101   Temp 99.2 F (37.3 C) (Oral)   Resp 18   Ht  5\' 8"  (1.727 m)   Wt 195 lb (88.5 kg)   SpO2 99%   BMI 29.65 kg/m   Physical Exam  Constitutional: He is oriented to person, place, and time. He appears well-developed and well-nourished.  HENT:  Head: Normocephalic and atraumatic.  Right Ear: External ear normal.  Left Ear: External ear normal.  Nose: Nose normal.  Eyes: Right eye exhibits no discharge. Left eye exhibits no discharge.  Neck: Neck supple.  Cardiovascular: Normal rate, regular rhythm and normal heart sounds.   Pulses:      Radial pulses are 2+ on the right side, and 2+ on the left side.       Dorsalis pedis pulses are 2+ on the right side, and 2+ on the left side.   Pulmonary/Chest: Effort normal and breath sounds normal.  Abdominal: Soft. There is no tenderness.  Musculoskeletal: He exhibits no edema.  No obvious large joint swelling or decreased ROM  Neurological: He is alert and oriented to person, place, and time.  Skin: Skin is warm and dry.  Psychiatric: Thought content is paranoid. He expresses no homicidal and no suicidal ideation.  Nursing note and vitals reviewed.   ED Treatments / Results  DIAGNOSTIC STUDIES: Oxygen Saturation is 97% on RA, normal by my interpretation.    COORDINATION OF CARE: 8:03 AM Discussed treatment plan with pt at bedside and pt agreed to plan. I will check the patient's labs.  Labs (all labs ordered are listed, but only abnormal results are displayed) Labs Reviewed  COMPREHENSIVE METABOLIC PANEL - Abnormal; Notable for the following:       Result Value   Chloride 96 (*)    Glucose, Bld 103 (*)    AST 60 (*)    Total Bilirubin 2.5 (*)    All other components within normal limits  ACETAMINOPHEN LEVEL - Abnormal; Notable for the following:    Acetaminophen (Tylenol), Serum <10 (*)    All other components within normal limits  CBC WITH DIFFERENTIAL/PLATELET - Abnormal; Notable for the following:    WBC 15.1 (*)    RBC 3.94 (*)    Hemoglobin 12.7 (*)    HCT 37.9 (*)    Neutro Abs 12.7 (*)    Monocytes Absolute 1.2 (*)    All other components within normal limits  RAPID URINE DRUG SCREEN, HOSP PERFORMED - Abnormal; Notable for the following:    Cocaine POSITIVE (*)    All other components within normal limits  ETHANOL  SALICYLATE LEVEL    EKG  EKG Interpretation None       Radiology No results found.  Procedures Procedures (including critical care time)  Medications Ordered in ED Medications  LORazepam (ATIVAN) tablet 0-4 mg (2 mg Oral Given 07/13/16 0910)    Followed by  LORazepam (ATIVAN) tablet 0-4 mg (not administered)  ibuprofen (ADVIL,MOTRIN) tablet 600 mg (not administered)    acetaminophen (TYLENOL) tablet 650 mg (not administered)  nicotine (NICODERM CQ - dosed in mg/24 hours) patch 21 mg (21 mg Transdermal Patch Applied 07/13/16 0910)  ondansetron (ZOFRAN) tablet 4 mg (not administered)  zolpidem (AMBIEN) tablet 5 mg (not administered)  pantoprazole (PROTONIX) EC tablet 40 mg (not administered)  hydrochlorothiazide (HYDRODIURIL) tablet 25 mg (not administered)  diphenhydrAMINE (BENADRYL) tablet 25 mg (not administered)  aspirin tablet 650 mg (not administered)     Initial Impression / Assessment and Plan / ED Course  I have reviewed the triage vital signs and the nursing notes.  Pertinent labs & imaging  results that were available during my care of the patient were reviewed by me and considered in my medical decision making (see chart for details).  Clinical Course as of Jul 13 1525  Wed Jul 13, 2016  [id number] Patient is paranoid but calm. Has some insight into disease and is willing to get help. Will consult TTS, send screening labs.  [SG]  [id number] UDS positive for cocaine. Anemia appears c/w prior baseline. Otherwise no alarming findings on workup. Psych dispo. Appears medically stable  [SG]  1009 TTS has seen and recommends re-eval in AM due to cocaine in UDS and unknown if this is causing symptoms acutely  [SG]    Clinical Course User Index [SG] Pricilla LovelessScott Domenic Schoenberger, MD    Patient overall is calm, needs psych eval. Med list includes decadron but has not taken in several months, not on it chronically. Stable for psych dispo.  Final Clinical Impressions(s) / ED Diagnoses   Final diagnoses:  Paranoia (HCC)    New Prescriptions New Prescriptions   No medications on file   I personally performed the services described in this documentation, which was scribed in my presence. The recorded information has been reviewed and is accurate.    Pricilla LovelessScott Avanti Jetter, MD 07/13/16 [phone]

## 2016-07-13 NOTE — ED Notes (Signed)
Pt grandfather, Philip Gonzalez, called and inquired about pt disposition and update. Pt gave verbal permission to discuss care plan with grandfather. Grandfather reported would call back later to check on pt. Pt family verbalized understanding of current care plan.  Pt grandfather can be contacted at [phone].

## 2016-07-13 NOTE — ED Notes (Signed)
Pt is paranoid and asking to be transferred out.

## 2016-07-13 NOTE — ED Triage Notes (Signed)
Per EMS- pt lives with grandfather-grandfather states pt is paranoid. Pt reports he called EMS for chronic arthritis pain 10/10. Pt also states he does not feel safe at home because "someone is stalking and following him but he doesn't want to talk about it". Pt reports he has history of PTSD.

## 2016-07-13 NOTE — ED Notes (Signed)
TTS in progress 

## 2016-07-13 NOTE — [facility] Counselor (Signed)
TTS complete. Disposition pending consultation with NP.   Kateri PlummerKristin Wren Gallaga LPC, LCASA

## 2016-07-13 NOTE — ED Provider Notes (Signed)
Patient states he does not want to stay in the emergency room any longer.  I reviewed the notes from behavioral health. Patient has a history of recent cocaine use. [facility] but has not been seeing anyone recently. Patient had an episode last evening of possible paranoia and hallucinations. He walked there were people out and was trying to harm him.  She denies hearing any voices this time. He does not feel that anyone is out to get him or harm him at this point.  She denies any suicidal or homicidal ideation.  I explained to the patient that he can certainly stay overnight as recommended by behavioral health. They plan to reevaluate him in the morning. Patient does not feel like he needs to be here any longer. I do not think he meets criteria for involuntary commitment at this time. I will discharge him as requested with [facility] and I will also provide outpatient resources guides for mental health.   Linwood DibblesJon Floride Hutmacher, MD 07/13/16 [phone]

## 2016-07-13 NOTE — Discharge Instructions (Signed)
Follow-up with a primary care doctor. Please refer to the discharge instructions [place]. Avoid cocaine use

## 2016-07-13 NOTE — BH Assessment (Signed)
Tele Assessment Note   Philip Gonzalez is an 51 y.o. male who came to the ED at the request of his Grandfather who was concerned about his bizarre behavior. 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 Pt has been having increased paranoia in the past couple of days and states that he thinks people are "trying to shoot him". He states that he has been hearing voices telling him that people are going to kill him. He states that his grandfather got concerned when he jumped into the shower to avoid getting "hit by bullets". Pt denies substance use but was positive for cocaine on UDS. Pt states that he hasn't slept in 2 days and appears to have twitching and involuntary muscle movements most likely from cocaine withdrawal. Pt was guarded and did not appear to be a good historian at this time. He states that he has VA benefits and goes to the [facility] but is not taking anything for his PTSD and hasn't in years. He states that he currently has nightmares and flashbacks however is not seeing a therapist or psychiatrist for these issues.   Disposition: Per Elta Guadeloupe NP pt needs to be observed overnight and reevaluated in the AM for final disposition.   Diagnosis: PTSD, cocaine induced mood disorder  Past Medical History:  Past Medical History:  Diagnosis Date  . Acid reflux   . Hemorrhoid   . Hypertension   . PTSD (post-traumatic stress disorder)   . STD (male)     History reviewed. No pertinent surgical history.  Family History: No family history on file.  Social History:  reports that he has been smoking Cigarettes.  He has a 14.00 pack-year smoking history. He has never used smokeless tobacco. He reports that he drinks alcohol. He reports that he does not use drugs.  Additional Social History:  Alcohol / Drug Use History of alcohol / drug use?: Yes Substance #1 Name of Substance 1: Cocaine 1 - Age of First Use: unspecified pt denies use however UDS was  positive  CIWA: CIWA-Ar BP: (!) 158/102 Pulse Rate: 80 Nausea and Vomiting: mild nausea with no vomiting Tactile Disturbances: moderately severe hallucinations Tremor: not visible, but can be felt fingertip to fingertip Auditory Disturbances: mild harshness or ability to frighten Paroxysmal Sweats: no sweat visible Visual Disturbances: moderate sensitivity Anxiety: two Headache, Fullness in Head: very mild Agitation: two Orientation and Clouding of Sensorium: oriented and can do serial additions CIWA-Ar Total: 16 COWS:    PATIENT STRENGTHS: (choose at least two) Average or above average intelligence General fund of knowledge  Allergies:  Allergies  Allergen Reactions  . Bee Venom Swelling    Bodily Swelling  . Poison Oak Extract [Poison Oak Extract] Rash    Home Medications:  (Not in a hospital admission)  OB/GYN Status:  No LMP for male patient.  General Assessment Data Location of Assessment: [facility] TTS Assessment: In system Is this a Tele or Face-to-Face Assessment?: Tele Assessment Is this an Initial Assessment or a Re-assessment for this encounter?: Initial Assessment Marital status: Single Living Arrangements: Other relatives Can pt return to current living arrangement?: Yes Admission Status: Voluntary Is patient capable of signing voluntary admission?: Yes Referral Source: Self/Family/Friend Insurance type:  (possible VA benefits)     Crisis Care Plan Living Arrangements: Other relatives Name of Psychiatrist: States he is not taking meds Name of Therapist: None  Education Status Is patient currently in school?: No Highest  grade of school patient has completed: 12th  Risk to self with the past 6 months Suicidal Ideation: No Has patient been a risk to self within the past 6 months prior to admission? : No Suicidal Intent: No Has patient had any suicidal intent within the past 6 months prior to admission? : No Is patient at risk for suicide?:  No Suicidal Plan?: No Has patient had any suicidal plan within the past 6 months prior to admission? : No Access to Means: No What has been your use of drugs/alcohol within the last 12 months?: using cocaine per UDS Previous Attempts/Gestures: No How many times?: 0 Other Self Harm Risks: paranoia Triggers for Past Attempts: None known Intentional Self Injurious Behavior: None Family Suicide History: Yes (Mom attempted SI) Recent stressful life event(s): Loss (Comment) (Mom passed March 2017 and Dad January 2018) Persecutory voices/beliefs?: Yes Depression: Yes Depression Symptoms: Despondent, Loss of interest in usual pleasures Substance abuse history and/or treatment for substance abuse?: Yes Suicide prevention information given to non-admitted patients: Not applicable  Risk to Others within the past 6 months Homicidal Ideation: No Does patient have any lifetime risk of violence toward others beyond the six months prior to admission? : No Thoughts of Harm to Others: No Current Homicidal Intent: No Current Homicidal Plan: No Access to Homicidal Means: No Identified Victim: none History of harm to others?: No Assessment of Violence: None Noted Violent Behavior Description: none Does patient have access to weapons?: No Criminal Charges Pending?: No Does patient have a court date: No Is patient on probation?: No  Psychosis Hallucinations: Auditory Delusions: Unspecified (Paranoid someone is trying to kill him)  Mental Status Report Appearance/Hygiene: Bizarre Eye Contact: Fair Motor Activity: Freedom of movement Speech: Logical/coherent Level of Consciousness: Alert Mood: Depressed Affect: Depressed Anxiety Level: Severe Thought Processes: Circumstantial Judgement: Impaired Orientation: Person, Place, Time, Situation Obsessive Compulsive Thoughts/Behaviors: Moderate  Cognitive Functioning Concentration: Decreased Memory: Recent Intact, Remote Intact IQ:  Average Insight: Fair Impulse Control: Fair Appetite: Fair Weight Loss: 0 Weight Gain: 0 Sleep: Decreased Total Hours of Sleep:  (hasn't slept in 2 days) Vegetative Symptoms: None  ADLScreening [facility] Assessment Services) Patient's cognitive ability adequate to safely complete daily activities?: Yes Patient able to express need for assistance with ADLs?: Yes Independently performs ADLs?: Yes (appropriate for developmental age)  Prior Inpatient Therapy Prior Inpatient Therapy: Yes Prior Therapy Dates: 39, 89, 2001  [facility] Reason for Treatment: PTSD   Prior Outpatient Therapy Prior Outpatient Therapy: Yes [facility]  Reason for Treatment: PTSD  Does patient have an ACCT team?: No Does patient have Intensive In-House Services?  : No Does patient have [facility] services? : No Does patient have P4CC services?: No  ADL Screening (condition at time of admission) Patient's cognitive ability adequate to safely complete daily activities?: Yes Is the patient deaf or have difficulty hearing?: No Does the patient have difficulty seeing, even when wearing glasses/contacts?: No Does the patient have difficulty concentrating, remembering, or making decisions?: No Patient able to express need for assistance with ADLs?: Yes Does the patient have difficulty dressing or bathing?: No Independently performs ADLs?: Yes (appropriate for developmental age) Does the patient have difficulty walking or climbing stairs?: No Weakness of Legs: None Weakness of Arms/Hands: None  Home Assistive Devices/Equipment Home Assistive Devices/Equipment: None  Therapy Consults (therapy consults require a physician order) PT Evaluation Needed: No OT Evalulation Needed: No SLP Evaluation Needed: No Abuse/Neglect Assessment (Assessment to be complete while patient is  alone) Physical Abuse: Denies Verbal Abuse: Denies Sexual Abuse: Denies Exploitation of  patient/patient's resources: Denies Self-Neglect: Denies Values / Beliefs Cultural Requests During Hospitalization: None Spiritual Requests During Hospitalization: None Consults Spiritual Care Consult Needed: No Social Work Consult Needed: No Merchant navy officerAdvance Directives (For Healthcare) Does Patient Have a Medical Advance Directive?: No Would patient like information on creating a medical advance directive?: Yes (Inpatient - patient requests chaplain consult to create a medical advance directive) Nutrition Screen- [facility] Adult/[facility] Patient's home diet: Regular Has the patient recently lost weight without trying?: No Has the patient been eating poorly because of a decreased appetite?: No Malnutrition Screening Tool Score: 0  Additional Information 1:1 In Past 12 Months?: No CIRT Risk: No Elopement Risk: No Does patient have medical clearance?: Yes     Disposition:  Disposition Initial Assessment Completed for this Encounter: Yes Disposition of Patient: Other dispositions Other disposition(s):  (reevaluate in AM)  Kerem Gilmer 07/13/2016 10:14 AM

## 2016-07-14 ENCOUNTER — Encounter (HOSPITAL_COMMUNITY): Payer: Self-pay | Admitting: Emergency Medicine

## 2016-07-14 ENCOUNTER — Inpatient Hospital Stay (HOSPITAL_COMMUNITY)
Admission: AD | Admit: 2016-07-14 | Discharge: 2016-07-22 | DRG: 885 | Disposition: A | Discharge status: Home / Self Care | Payer: No Typology Code available for payment source | Source: Intra-hospital | Attending: Psychiatry | Admitting: Psychiatry

## 2016-07-14 ENCOUNTER — Encounter (HOSPITAL_COMMUNITY): Payer: Self-pay

## 2016-07-14 ENCOUNTER — Emergency Department (HOSPITAL_COMMUNITY)
Admission: EM | Admit: 2016-07-14 | Discharge: 2016-07-14 | Disposition: A | Discharge status: Other Acute Inpatient Hospital | Payer: Self-pay | Attending: Emergency Medicine | Admitting: Emergency Medicine

## 2016-07-14 DIAGNOSIS — F1310 Sedative, hypnotic or anxiolytic abuse, uncomplicated: Secondary | ICD-10-CM | POA: Diagnosis present

## 2016-07-14 DIAGNOSIS — Z818 Family history of other mental and behavioral disorders: Secondary | ICD-10-CM | POA: Diagnosis not present

## 2016-07-14 DIAGNOSIS — F17200 Nicotine dependence, unspecified, uncomplicated: Secondary | ICD-10-CM | POA: Clinically undetermined

## 2016-07-14 DIAGNOSIS — F1510 Other stimulant abuse, uncomplicated: Secondary | ICD-10-CM | POA: Diagnosis not present

## 2016-07-14 DIAGNOSIS — G4700 Insomnia, unspecified: Secondary | ICD-10-CM | POA: Diagnosis present

## 2016-07-14 DIAGNOSIS — F17210 Nicotine dependence, cigarettes, uncomplicated: Secondary | ICD-10-CM | POA: Diagnosis present

## 2016-07-14 DIAGNOSIS — F4310 Post-traumatic stress disorder, unspecified: Secondary | ICD-10-CM | POA: Diagnosis present

## 2016-07-14 DIAGNOSIS — F1220 Cannabis dependence, uncomplicated: Secondary | ICD-10-CM | POA: Diagnosis present

## 2016-07-14 DIAGNOSIS — F323 Major depressive disorder, single episode, severe with psychotic features: Secondary | ICD-10-CM | POA: Diagnosis not present

## 2016-07-14 DIAGNOSIS — F1020 Alcohol dependence, uncomplicated: Secondary | ICD-10-CM | POA: Diagnosis present

## 2016-07-14 DIAGNOSIS — F1210 Cannabis abuse, uncomplicated: Secondary | ICD-10-CM | POA: Diagnosis not present

## 2016-07-14 DIAGNOSIS — Z79899 Other long term (current) drug therapy: Secondary | ICD-10-CM | POA: Insufficient documentation

## 2016-07-14 DIAGNOSIS — G8929 Other chronic pain: Secondary | ICD-10-CM | POA: Diagnosis present

## 2016-07-14 DIAGNOSIS — I10 Essential (primary) hypertension: Secondary | ICD-10-CM | POA: Diagnosis present

## 2016-07-14 DIAGNOSIS — M549 Dorsalgia, unspecified: Secondary | ICD-10-CM | POA: Diagnosis present

## 2016-07-14 DIAGNOSIS — Z7982 Long term (current) use of aspirin: Secondary | ICD-10-CM | POA: Insufficient documentation

## 2016-07-14 DIAGNOSIS — F22 Delusional disorders: Secondary | ICD-10-CM | POA: Insufficient documentation

## 2016-07-14 DIAGNOSIS — F329 Major depressive disorder, single episode, unspecified: Secondary | ICD-10-CM | POA: Diagnosis present

## 2016-07-14 DIAGNOSIS — F1610 Hallucinogen abuse, uncomplicated: Secondary | ICD-10-CM | POA: Clinically undetermined

## 2016-07-14 DIAGNOSIS — F1010 Alcohol abuse, uncomplicated: Secondary | ICD-10-CM | POA: Diagnosis not present

## 2016-07-14 LAB — RAPID URINE DRUG SCREEN, HOSP PERFORMED
Amphetamines: NOT DETECTED
BENZODIAZEPINES: POSITIVE — AB
Barbiturates: NOT DETECTED
COCAINE: POSITIVE — AB
OPIATES: NOT DETECTED
Tetrahydrocannabinol: NOT DETECTED

## 2016-07-14 LAB — COMPREHENSIVE METABOLIC PANEL
ALBUMIN: 4.6 g/dL (ref 3.5–5.0)
ALT: 30 U/L (ref 17–63)
ANION GAP: 11 (ref 5–15)
AST: 46 U/L — ABNORMAL HIGH (ref 15–41)
Alkaline Phosphatase: 78 U/L (ref 38–126)
BILIRUBIN TOTAL: 1.8 mg/dL — AB (ref 0.3–1.2)
BUN: 16 mg/dL (ref 6–20)
CALCIUM: 9.4 mg/dL (ref 8.9–10.3)
CO2: 29 mmol/L (ref 22–32)
Chloride: 95 mmol/L — ABNORMAL LOW (ref 101–111)
Creatinine, Ser: 1.23 mg/dL (ref 0.61–1.24)
GFR calc non Af Amer: >60 mL/min (ref >60)
GLUCOSE: 98 mg/dL (ref 65–99)
POTASSIUM: 3.5 mmol/L (ref 3.5–5.1)
Sodium: 135 mmol/L (ref 135–145)
TOTAL PROTEIN: 8.1 g/dL (ref 6.5–8.1)

## 2016-07-14 LAB — CBC WITH DIFFERENTIAL/PLATELET
BASOS PCT: 0 %
Basophils Absolute: 0 10*3/uL (ref 0.0–0.1)
EOS ABS: 0.1 10*3/uL (ref 0.0–0.7)
Eosinophils Relative: 1 %
HEMATOCRIT: 37.7 % — AB (ref 39.0–52.0)
Hemoglobin: 12.8 g/dL — ABNORMAL LOW (ref 13.0–17.0)
LYMPHS ABS: 1.4 10*3/uL (ref 0.7–4.0)
Lymphocytes Relative: 16 %
MCH: 32.6 pg (ref 26.0–34.0)
MCHC: 34 g/dL (ref 30.0–36.0)
MCV: 95.9 fL (ref 78.0–100.0)
MONO ABS: 1 10*3/uL (ref 0.1–1.0)
MONOS PCT: 11 %
NEUTROS ABS: 6.4 10*3/uL (ref 1.7–7.7)
Neutrophils Relative %: 72 %
Platelets: 208 10*3/uL (ref 150–400)
RBC: 3.93 MIL/uL — ABNORMAL LOW (ref 4.22–5.81)
RDW: 12.1 % (ref 11.5–15.5)
WBC: 8.8 10*3/uL (ref 4.0–10.5)

## 2016-07-14 LAB — ETHANOL

## 2016-07-14 MED ORDER — ASPIRIN 325 MG PO TABS
650.0000 mg | ORAL_TABLET | Freq: Every day | ORAL | Status: DC
  Administered 2016-07-14: 650 mg via ORAL
  Filled 2016-07-14: qty 2

## 2016-07-14 MED ORDER — ALUM & MAG HYDROXIDE-SIMETH 200-200-20 MG/5ML PO SUSP: 30.0000 mL | ORAL | Status: DC | PRN

## 2016-07-14 MED ORDER — OLANZAPINE 10 MG PO TBDP
10.0000 mg | ORAL_TABLET | Freq: Every day | ORAL | Status: DC
  Administered 2016-07-15: 10 mg via ORAL
  Filled 2016-07-14 (×2): qty 1

## 2016-07-14 MED ORDER — DIVALPROEX SODIUM 250 MG PO DR TAB
250.0000 mg | DELAYED_RELEASE_TABLET | Freq: Three times a day (TID) | ORAL | Status: DC
  Administered 2016-07-14 – 2016-07-18 (×12): 250 mg via ORAL
  Filled 2016-07-14 (×19): qty 1

## 2016-07-14 MED ORDER — LORAZEPAM 2 MG/ML IJ SOLN: 2.0000 mg | Freq: Once | INTRAMUSCULAR | Status: AC | PRN

## 2016-07-14 MED ORDER — HALOPERIDOL 5 MG PO TABS
5.0000 mg | ORAL_TABLET | Freq: Once | ORAL | Status: DC | PRN
  Filled 2016-07-14: qty 1

## 2016-07-14 MED ORDER — HALOPERIDOL LACTATE 5 MG/ML IJ SOLN: 5.0000 mg | Freq: Once | INTRAMUSCULAR | Status: DC | PRN

## 2016-07-14 MED ORDER — HYDROXYZINE HCL 25 MG PO TABS: 25.0000 mg | ORAL_TABLET | Freq: Four times a day (QID) | ORAL | Status: DC | PRN

## 2016-07-14 MED ORDER — TRAZODONE HCL 50 MG PO TABS
50.0000 mg | ORAL_TABLET | Freq: Every evening | ORAL | Status: DC | PRN
  Administered 2016-07-14: 50 mg via ORAL
  Filled 2016-07-14: qty 1

## 2016-07-14 MED ORDER — MAGNESIUM HYDROXIDE 400 MG/5ML PO SUSP: 30.0000 mL | Freq: Every day | ORAL | Status: DC | PRN

## 2016-07-14 MED ORDER — LORAZEPAM 1 MG PO TABS
2.0000 mg | ORAL_TABLET | Freq: Once | ORAL | Status: AC | PRN
  Administered 2016-07-14: 2 mg via ORAL
  Filled 2016-07-14: qty 2

## 2016-07-14 MED ORDER — ACETAMINOPHEN 325 MG PO TABS
650.0000 mg | ORAL_TABLET | Freq: Four times a day (QID) | ORAL | Status: DC | PRN
  Administered 2016-07-15: 650 mg via ORAL
  Filled 2016-07-14: qty 2

## 2016-07-14 MED ORDER — PANTOPRAZOLE SODIUM 40 MG PO TBEC
40.0000 mg | DELAYED_RELEASE_TABLET | Freq: Every day | ORAL | Status: DC
  Administered 2016-07-14: 40 mg via ORAL
  Filled 2016-07-14: qty 1

## 2016-07-14 MED ORDER — NICOTINE 21 MG/24HR TD PT24
21.0000 mg | MEDICATED_PATCH | Freq: Every day | TRANSDERMAL | Status: DC
  Administered 2016-07-15 – 2016-07-22 (×8): 21 mg via TRANSDERMAL
  Filled 2016-07-14 (×9): qty 1

## 2016-07-14 MED ORDER — HYDROCHLOROTHIAZIDE 25 MG PO TABS
25.0000 mg | ORAL_TABLET | Freq: Every day | ORAL | Status: DC
  Administered 2016-07-14: 25 mg via ORAL
  Filled 2016-07-14: qty 1

## 2016-07-14 MED ORDER — OLANZAPINE 5 MG PO TBDP
10.0000 mg | ORAL_TABLET | Freq: Every day | ORAL | Status: DC
  Administered 2016-07-14: 10 mg via ORAL
  Filled 2016-07-14: qty 2

## 2016-07-14 MED ORDER — TRAMADOL HCL 50 MG PO TABS
50.0000 mg | ORAL_TABLET | Freq: Four times a day (QID) | ORAL | Status: DC | PRN
  Administered 2016-07-14: 50 mg via ORAL
  Filled 2016-07-14: qty 1

## 2016-07-14 MED ORDER — DIPHENHYDRAMINE HCL 25 MG PO CAPS
50.0000 mg | ORAL_CAPSULE | Freq: Four times a day (QID) | ORAL | Status: DC | PRN
  Administered 2016-07-14: 50 mg via ORAL
  Filled 2016-07-14: qty 2

## 2016-07-14 NOTE — Progress Notes (Signed)
Seeking inpatient treatment for pt at recommendation of TTS assessment. [facility] but is considered for admission when beds available.  [facility] ([facility] but accepting referrals for waiting list) [facility]  Ilean SkillMeghan Phinley Schall, MSW, LCSW Clinical Social Work, Disposition  07/14/2016 [phone]

## 2016-07-14 NOTE — ED Notes (Signed)
Pt is experiencing paranoia at this time, thinks someone is going to shoot him here and pt had tried to run out of room into hall.  [employer] at bedside and able to get pt back in room.  Explained to pt that is safe here and in a safe place.

## 2016-07-14 NOTE — ED Triage Notes (Addendum)
Per EMS-pt reporting paranoid hallucinations that people are trying to shoot him. Grandfather reports pt was up all night checking doors and windows.    Pt states he came back because his family wanted him to. Pt states he thinks he "just needs something to calm him down". Denies SI/HI.

## 2016-07-14 NOTE — ED Notes (Signed)
Pt restless in room, pt stating he is wanting to leave, explained to pt that he can not leave and that there is a process in getting a bed to a facility
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
## 2016-07-14 NOTE — ED Notes (Signed)
Grandfather number [phone]-Philip Gonzalez . Per Belenda CruiseKristin  [facility] is going to call his grandfather

## 2016-07-14 NOTE — ED Provider Notes (Signed)
[facility]-EMER[facility]Y DEPT Provider Note   CSN: [id number] Arrival date & time: 07/14/16  [id number]   By signing my name below, I, Philip Gonzalez, attest that this documentation has been prepared under the direction and in the presence of Bethann BerkshireJoseph Adalina Dopson, MD  Electronically Signed: Clovis PuAvnee Gonzalez, ED Scribe. 07/14/16. 8:27 AM.   History   Chief Complaint Chief Complaint  Patient presents with  . Paranoid   HPI Comments:  Philip Gonzalez is a 51 y.o. male, with a hx of PTSD, who presents to the Emergency Department complaining of paranoia onset yesterday. Pt reports hallucinations and reports people are out to get him. Per triage notes, grandfather reports the pt was up all night checking the doors and windows. No alleviating factors noted. Pt denies suicidal ideation, homicidal ideation or any other symptoms at this time.   The history is provided by the patient. No language interpreter was used.  Mental Health Problem  Presenting symptoms: hallucinations and paranoid behavior   Presenting symptoms: no homicidal ideas and no suicidal thoughts   Patient accompanied by:  Grandparent Timing:  Constant Progression:  Unable to specify Chronicity:  Recurrent Treatment compliance:  Unable to specify Relieved by:  Nothing Worsened by:  Nothing Associated symptoms: no abdominal pain, no appetite change, no chest pain, no fatigue and no headaches     Past Medical History:  Diagnosis Date  . Acid reflux   . Hemorrhoid   . Hypertension   . PTSD (post-traumatic stress disorder)   . STD (male)     There are no active problems to display for this patient.   History reviewed. No pertinent surgical history.   Home Medications    Prior to Admission medications   Medication Sig Start Date End Date Taking? Authorizing Provider  aspirin 325 MG tablet Take 650 mg by mouth daily.     Historical Provider, MD  diclofenac (VOLTAREN) 75 MG EC tablet Take 1 tablet (75 mg total) by mouth 2 (two) times  daily. Patient taking differently: Take 75 mg by mouth daily as needed for mild pain.  02/18/16   Ivery QualeHobson Bryant, PA-C  diphenhydrAMINE (BENADRYL) 25 MG tablet Take 1 tablet (25 mg total) by mouth every 6 (six) hours as needed for itching (Rash). 06/26/16   [name], PA-C  hydrochlorothiazide (HYDRODIURIL) 25 MG tablet Take 25 mg by mouth daily.    Historical Provider, MD  omeprazole (PRILOSEC) 20 MG capsule Take 20 mg by mouth daily.    Historical Provider, MD  traMADol (ULTRAM) 50 MG tablet Take 1 tablet (50 mg total) by mouth every 6 (six) hours as needed. 02/18/16   Ivery QualeHobson Bryant, PA-C    Family History No family history on file.  Social History Social History  Substance Use Topics  . Smoking status: Smoker, Current Status Unknown    Packs/day: 1.00    Years: 14.00    Types: Cigarettes    Last attempt to quit: 01/18/2016  . Smokeless tobacco: Never Used  . Alcohol use Yes     Comment: occ     Allergies   Bee venom and Poison oak extract [poison oak extract]   Review of Systems Review of Systems  Constitutional: Negative for appetite change, fatigue and fever.  HENT: Negative for congestion, ear discharge and sinus pressure.   Eyes: Negative for discharge.  Respiratory: Negative for cough.   Cardiovascular: Negative for chest pain.  Gastrointestinal: Negative for abdominal pain and diarrhea.  Genitourinary: Negative for frequency and hematuria.  Musculoskeletal:  Negative for back pain.  Skin: Negative for rash.  Neurological: Negative for seizures and headaches.  Psychiatric/Behavioral: Positive for hallucinations and paranoia. Negative for homicidal ideas and suicidal ideas.   Physical Exam Updated Vital Signs BP (!) 140/105 (BP Location: Right Arm)   Pulse 107   Temp 98.4 F (36.9 C) (Oral)   Resp 16   Ht 6' (1.829 m)   Wt 195 lb (88.5 kg)   SpO2 96%   BMI 26.45 kg/m   Physical Exam  Constitutional: He is oriented to person, place, and time. He appears  well-developed.  HENT:  Head: Normocephalic.  Eyes: Conjunctivae and EOM are normal. No scleral icterus.  Neck: Neck supple. No thyromegaly present.  Cardiovascular: Normal rate and regular rhythm.  Exam reveals no gallop and no friction rub.   No murmur heard. Pulmonary/Chest: No stridor. He has no wheezes. He has no rales. He exhibits no tenderness.  Abdominal: He exhibits no distension. There is no tenderness. There is no rebound.  Musculoskeletal: Normal range of motion. He exhibits no edema.  Lymphadenopathy:    He has no cervical adenopathy.  Neurological: He is oriented to person, place, and time. He exhibits normal muscle tone. Coordination normal.  Skin: No rash noted. No erythema.  Psychiatric: He has a normal mood and affect. His behavior is normal. Thought content is paranoid.  Paranoid delusions      ED Treatments / Results  DIAGNOSTIC STUDIES:  Oxygen Saturation is 96% on RA, normal by my interpretation.    COORDINATION OF CARE:  8:25 AM Discussed treatment plan with pt at bedside and pt agreed to plan.  Labs (all labs ordered are listed, but only abnormal results are displayed) Labs Reviewed - No data to display  EKG  EKG Interpretation None       Radiology No results found.  Procedures Procedures (including critical care time)  Medications Ordered in ED Medications - No data to display   Initial Impression / Assessment and Plan / ED Course  I have reviewed the triage vital signs and the nursing notes.  Pertinent labs & imaging results that were available during my care of the patient were reviewed by me and considered in my medical decision making (see chart for details).    Patient is paranoid. Patient seen by psych who recommended inpatient treatment  Final Clinical Impressions(s) / ED Diagnoses   Final diagnoses:  None    New Prescriptions New Prescriptions   No medications on file  The chart was scribed for me under my direct  supervision.  I personally performed the history, physical, and medical decision making and all procedures in the evaluation of this patient.Bethann Berkshire, MD 07/14/16 [phone]
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
## 2016-07-15 ENCOUNTER — Encounter (HOSPITAL_COMMUNITY): Payer: Self-pay | Admitting: Psychiatry

## 2016-07-15 DIAGNOSIS — F1510 Other stimulant abuse, uncomplicated: Secondary | ICD-10-CM

## 2016-07-15 DIAGNOSIS — I10 Essential (primary) hypertension: Secondary | ICD-10-CM | POA: Diagnosis present

## 2016-07-15 DIAGNOSIS — F17210 Nicotine dependence, cigarettes, uncomplicated: Secondary | ICD-10-CM

## 2016-07-15 DIAGNOSIS — Z818 Family history of other mental and behavioral disorders: Secondary | ICD-10-CM

## 2016-07-15 DIAGNOSIS — Z79899 Other long term (current) drug therapy: Secondary | ICD-10-CM

## 2016-07-15 DIAGNOSIS — F1220 Cannabis dependence, uncomplicated: Secondary | ICD-10-CM

## 2016-07-15 DIAGNOSIS — Z9109 Other allergy status, other than to drugs and biological substances: Secondary | ICD-10-CM

## 2016-07-15 DIAGNOSIS — F1310 Sedative, hypnotic or anxiolytic abuse, uncomplicated: Secondary | ICD-10-CM

## 2016-07-15 DIAGNOSIS — F1610 Hallucinogen abuse, uncomplicated: Secondary | ICD-10-CM | POA: Clinically undetermined

## 2016-07-15 DIAGNOSIS — F17200 Nicotine dependence, unspecified, uncomplicated: Secondary | ICD-10-CM | POA: Clinically undetermined

## 2016-07-15 DIAGNOSIS — F1020 Alcohol dependence, uncomplicated: Secondary | ICD-10-CM

## 2016-07-15 DIAGNOSIS — R45851 Suicidal ideations: Secondary | ICD-10-CM

## 2016-07-15 DIAGNOSIS — Z91030 Bee allergy status: Secondary | ICD-10-CM

## 2016-07-15 DIAGNOSIS — F323 Major depressive disorder, single episode, severe with psychotic features: Principal | ICD-10-CM

## 2016-07-15 DIAGNOSIS — G8929 Other chronic pain: Secondary | ICD-10-CM

## 2016-07-15 DIAGNOSIS — M549 Dorsalgia, unspecified: Secondary | ICD-10-CM

## 2016-07-15 DIAGNOSIS — F4310 Post-traumatic stress disorder, unspecified: Secondary | ICD-10-CM

## 2016-07-15 MED ORDER — LORAZEPAM 1 MG PO TABS
1.0000 mg | ORAL_TABLET | Freq: Three times a day (TID) | ORAL | Status: AC
  Administered 2016-07-16 – 2016-07-17 (×3): 1 mg via ORAL
  Filled 2016-07-15 (×3): qty 1

## 2016-07-15 MED ORDER — INFLUENZA VAC SPLIT QUAD 0.5 ML IM SUSY
0.5000 mL | PREFILLED_SYRINGE | INTRAMUSCULAR | Status: AC
  Administered 2016-07-16: 0.5 mL via INTRAMUSCULAR
  Filled 2016-07-15: qty 0.5

## 2016-07-15 MED ORDER — LORAZEPAM 1 MG PO TABS: 1.0000 mg | ORAL_TABLET | Freq: Four times a day (QID) | ORAL | Status: AC | PRN

## 2016-07-15 MED ORDER — BENZTROPINE MESYLATE 0.5 MG PO TABS
0.5000 mg | ORAL_TABLET | Freq: Every day | ORAL | Status: DC
  Administered 2016-07-15 – 2016-07-21 (×7): 0.5 mg via ORAL
  Filled 2016-07-15 (×8): qty 1
  Filled 2016-07-15: qty 7
  Filled 2016-07-15: qty 1

## 2016-07-15 MED ORDER — TRAZODONE HCL 100 MG PO TABS
100.0000 mg | ORAL_TABLET | Freq: Every evening | ORAL | Status: DC | PRN
  Administered 2016-07-17 – 2016-07-19 (×3): 100 mg via ORAL
  Filled 2016-07-15 (×3): qty 1

## 2016-07-15 MED ORDER — HYDROXYZINE HCL 25 MG PO TABS
25.0000 mg | ORAL_TABLET | Freq: Four times a day (QID) | ORAL | Status: AC | PRN
  Administered 2016-07-17: 25 mg via ORAL
  Filled 2016-07-15: qty 1

## 2016-07-15 MED ORDER — RISPERIDONE 1 MG PO TABS
1.0000 mg | ORAL_TABLET | Freq: Every day | ORAL | Status: DC
  Administered 2016-07-15 – 2016-07-21 (×7): 1 mg via ORAL
  Filled 2016-07-15 (×5): qty 1
  Filled 2016-07-15: qty 7
  Filled 2016-07-15 (×3): qty 1

## 2016-07-15 MED ORDER — LORAZEPAM 1 MG PO TABS
1.0000 mg | ORAL_TABLET | Freq: Four times a day (QID) | ORAL | Status: AC
  Administered 2016-07-15 – 2016-07-16 (×4): 1 mg via ORAL
  Filled 2016-07-15 (×4): qty 1

## 2016-07-15 MED ORDER — ADULT MULTIVITAMIN W/MINERALS CH
1.0000 | ORAL_TABLET | Freq: Every day | ORAL | Status: DC
  Administered 2016-07-15 – 2016-07-22 (×8): 1 via ORAL
  Filled 2016-07-15 (×10): qty 1

## 2016-07-15 MED ORDER — HALOPERIDOL LACTATE 5 MG/ML IJ SOLN
5.0000 mg | Freq: Three times a day (TID) | INTRAMUSCULAR | Status: DC | PRN
Start: 2016-07-15 — End: 2016-07-22

## 2016-07-15 MED ORDER — LORAZEPAM 1 MG PO TABS
1.0000 mg | ORAL_TABLET | Freq: Two times a day (BID) | ORAL | Status: AC
  Administered 2016-07-17 – 2016-07-18 (×2): 1 mg via ORAL
  Filled 2016-07-15: qty 1

## 2016-07-15 MED ORDER — VITAMIN B-1 100 MG PO TABS
100.0000 mg | ORAL_TABLET | Freq: Every day | ORAL | Status: DC
  Administered 2016-07-16 – 2016-07-22 (×7): 100 mg via ORAL
  Filled 2016-07-15 (×8): qty 1

## 2016-07-15 MED ORDER — LORAZEPAM 1 MG PO TABS
1.0000 mg | ORAL_TABLET | Freq: Every day | ORAL | Status: AC
  Administered 2016-07-19: 1 mg via ORAL
  Filled 2016-07-15 (×2): qty 1

## 2016-07-15 MED ORDER — ONDANSETRON 4 MG PO TBDP: 4.0000 mg | ORAL_TABLET | Freq: Four times a day (QID) | ORAL | Status: AC | PRN

## 2016-07-15 MED ORDER — PNEUMOCOCCAL VAC POLYVALENT 25 MCG/0.5ML IJ INJ
0.5000 mL | INJECTION | INTRAMUSCULAR | Status: AC
  Administered 2016-07-16: 0.5 mL via INTRAMUSCULAR

## 2016-07-15 MED ORDER — HALOPERIDOL 5 MG PO TABS: 5.0000 mg | ORAL_TABLET | Freq: Three times a day (TID) | ORAL | Status: DC | PRN

## 2016-07-15 MED ORDER — RISPERIDONE 0.5 MG PO TABS
0.5000 mg | ORAL_TABLET | Freq: Two times a day (BID) | ORAL | Status: DC
  Administered 2016-07-15 – 2016-07-22 (×14): 0.5 mg via ORAL
  Filled 2016-07-15 (×9): qty 1
  Filled 2016-07-15: qty 14
  Filled 2016-07-15 (×7): qty 1
  Filled 2016-07-15: qty 14

## 2016-07-15 MED ORDER — SALINE SPRAY 0.65 % NA SOLN
1.0000 | NASAL | Status: DC | PRN
  Administered 2016-07-15 – 2016-07-20 (×3): 1 via NASAL
  Filled 2016-07-15: qty 44

## 2016-07-15 MED ORDER — HYDROCHLOROTHIAZIDE 12.5 MG PO CAPS
12.5000 mg | ORAL_CAPSULE | Freq: Every day | ORAL | Status: DC
  Administered 2016-07-15 – 2016-07-22 (×8): 12.5 mg via ORAL
  Filled 2016-07-15 (×11): qty 1

## 2016-07-15 MED ORDER — CITALOPRAM HYDROBROMIDE 10 MG PO TABS
10.0000 mg | ORAL_TABLET | Freq: Every day | ORAL | Status: DC
  Administered 2016-07-16 – 2016-07-17 (×2): 10 mg via ORAL
  Filled 2016-07-15 (×4): qty 1

## 2016-07-15 MED ORDER — THIAMINE HCL 100 MG/ML IJ SOLN
100.0000 mg | Freq: Once | INTRAMUSCULAR | Status: AC
  Administered 2016-07-15: 100 mg via INTRAMUSCULAR
  Filled 2016-07-15: qty 2

## 2016-07-15 MED ORDER — LOPERAMIDE HCL 2 MG PO CAPS: 2.0000 mg | ORAL_CAPSULE | ORAL | Status: AC | PRN

## 2016-07-15 NOTE — Plan of Care (Signed)
Problem: Safety: Goal: Periods of time without injury will increase Outcome: Progressing Patient denies SI/HI. Patient contracts for safety. Patient resting in the quiet room and remains on q15 minute safety checks.

## 2016-07-15 NOTE — Tx Team (Signed)
Interdisciplinary Treatment and Diagnostic Plan Update  07/15/2016 Time of Session: 1:21 PM  Philip Gonzalez MRN: [id number]  Principal Diagnosis: MDD (major depressive disorder), single episode, severe with psychosis ([place])  Secondary Diagnoses: Principal Problem:   MDD (major depressive disorder), single episode, severe with psychosis ([place]) Active Problems:   PTSD (post-traumatic stress disorder)   MDMA abuse   Cannabis use disorder, severe, dependence ([place])   Alcohol use disorder, moderate, dependence ([place])   Tobacco use disorder   Benzodiazepine abuse   Chronic back pain   Essential hypertension   Current Medications:  Current Facility-Administered Medications  Medication Dose Route Frequency Provider Last Rate Last Dose  . acetaminophen (TYLENOL) tablet 650 mg  650 mg Oral Q6H PRN Ethelene Hal, NP   650 mg at 07/15/16 4098  . alum & mag hydroxide-simeth (MAALOX/MYLANTA) 200-200-20 MG/5ML suspension 30 mL  30 mL Oral Q4H PRN Ethelene Hal, NP      . benztropine (COGENTIN) tablet 0.5 mg  0.5 mg Oral QHS Saramma Eappen, MD      . divalproex (DEPAKOTE) DR tablet 250 mg  250 mg Oral Q8H Ethelene Hal, NP   250 mg at 07/15/16 1308  . haloperidol (HALDOL) tablet 5 mg  5 mg Oral Q8H PRN Ursula Alert, MD       Or  . haloperidol lactate (HALDOL) injection 5 mg  5 mg Intramuscular Q8H PRN Saramma Eappen, MD      . hydrochlorothiazide (MICROZIDE) capsule 12.5 mg  12.5 mg Oral Daily Saramma Eappen, MD   12.5 mg at 07/15/16 1308  . hydrOXYzine (ATARAX/VISTARIL) tablet 25 mg  25 mg Oral Q6H PRN Ursula Alert, MD      . Derrill Memo ON 07/16/2016] Influenza vac split quadrivalent PF (FLUARIX) injection 0.5 mL  0.5 mL Intramuscular Tomorrow-1000 Saramma Eappen, MD      . loperamide (IMODIUM) capsule 2-4 mg  2-4 mg Oral PRN Ursula Alert, MD      . LORazepam (ATIVAN) tablet 1 mg  1 mg Oral Q6H PRN Saramma Eappen, MD      . LORazepam (ATIVAN) tablet 1 mg  1 mg Oral QID Ursula Alert, MD   1 mg at 07/15/16 1209   Followed by  . [START ON 07/16/2016] LORazepam (ATIVAN) tablet 1 mg  1 mg Oral TID Ursula Alert, MD       Followed by  . [START ON 07/17/2016] LORazepam (ATIVAN) tablet 1 mg  1 mg Oral BID Ursula Alert, MD       Followed by  . [START ON 07/19/2016] LORazepam (ATIVAN) tablet 1 mg  1 mg Oral Daily Saramma Eappen, MD      . magnesium hydroxide (MILK OF MAGNESIA) suspension 30 mL  30 mL Oral Daily PRN Ethelene Hal, NP      . multivitamin with minerals tablet 1 tablet  1 tablet Oral Daily Ursula Alert, MD   1 tablet at 07/15/16 1209  . nicotine (NICODERM CQ - dosed in mg/24 hours) patch 21 mg  21 mg Transdermal Daily Ursula Alert, MD   21 mg at 07/15/16 0806  . ondansetron (ZOFRAN-ODT) disintegrating tablet 4 mg  4 mg Oral Q6H PRN Ursula Alert, MD      . Derrill Memo ON 07/16/2016] pneumococcal 23 valent vaccine (PNU-IMMUNE) injection 0.5 mL  0.5 mL Intramuscular Tomorrow-1000 Saramma Eappen, MD      . risperiDONE (RISPERDAL) tablet 0.5 mg  0.5 mg Oral BID Ursula Alert, MD      .  risperiDONE (RISPERDAL) tablet 1 mg  1 mg Oral QHS Ursula Alert, MD      . Derrill Memo ON 07/16/2016] thiamine (VITAMIN B-1) tablet 100 mg  100 mg Oral Daily Saramma Eappen, MD      . traZODone (DESYREL) tablet 100 mg  100 mg Oral QHS PRN Ursula Alert, MD        PTA Medications: Prescriptions Prior to Admission  Medication Sig Dispense Refill Last Dose  . aspirin 325 MG tablet Take 650 mg by mouth daily.    07/11/2016 at Unknown time  . hydrochlorothiazide (HYDRODIURIL) 25 MG tablet Take 25 mg by mouth daily.   07/13/2016 at Unknown time  . omeprazole (PRILOSEC) 20 MG capsule Take 20 mg by mouth daily.   Past Week at Unknown time  . traMADol (ULTRAM) 50 MG tablet Take 1 tablet (50 mg total) by mouth every 6 (six) hours as needed. 12 tablet 0 unknown at Unknown time    Treatment Modalities: Medication Management, Group therapy, Case management,  1 to 1 session with clinician,  Psychoeducation, Recreational therapy.   Physician Treatment Plan for Primary Diagnosis: MDD (major depressive disorder), single episode, severe with psychosis ([place]) Long Term Goal(s): Improvement in symptoms so as ready for discharge  Short Term Goals: Ability to verbalize feelings will improve Compliance with prescribed medications will improve Ability to identify triggers associated with substance abuse/mental health issues will improve   Medication Management: Evaluate patient's response, side effects, and tolerance of medication regimen.  Therapeutic Interventions: 1 to 1 sessions, Unit Group sessions and Medication administration.  Evaluation of Outcomes: Progressing  Physician Treatment Plan for Secondary Diagnosis: Principal Problem:   MDD (major depressive disorder), single episode, severe with psychosis ([place]) Active Problems:   PTSD (post-traumatic stress disorder)   MDMA abuse   Cannabis use disorder, severe, dependence ([place])   Alcohol use disorder, moderate, dependence ([place])   Tobacco use disorder   Benzodiazepine abuse   Chronic back pain   Essential hypertension   Long Term Goal(s): Improvement in symptoms so as ready for discharge  Short Term Goals: Ability to verbalize feelings will improve Compliance with prescribed medications will improve Ability to identify triggers associated with substance abuse/mental health issues will improve   Medication Management: Evaluate patient's response, side effects, and tolerance of medication regimen.  Therapeutic Interventions: 1 to 1 sessions, Unit Group sessions and Medication administration.  Evaluation of Outcomes: Progressing   RN Treatment Plan for Primary Diagnosis: MDD (major depressive disorder), single episode, severe with psychosis ([place]) Long Term Goal(s): Knowledge of disease and therapeutic regimen to maintain health will improve  Short Term Goals: Ability to identify and develop effective coping  behaviors will improve and Compliance with prescribed medications will improve  Medication Management: RN will administer medications as ordered by provider, will assess and evaluate patient's response and provide education to patient for prescribed medication. RN will report any adverse and/or side effects to prescribing provider.  Therapeutic Interventions: 1 on 1 counseling sessions, Psychoeducation, Medication administration, Evaluate responses to treatment, Monitor vital signs and CBGs as ordered, Perform/monitor CIWA, COWS, AIMS and Fall Risk screenings as ordered, Perform wound care treatments as ordered.  Evaluation of Outcomes: Progressing    Recreational Therapy Treatment Plan for Primary Diagnosis: MDD (major depressive disorder), single episode, severe with psychosis ([place]) Long Term Goal(s): LTG- Patient will participate in recreation therapy tx in at least 2 group sessions without prompting from LRT.  Short Term Goals: Patient will demonstrate increased ability to follow instructions, as  demonstrated by ability to follow LRT instructions on first prompt during recreation therapy group sessions.  Treatment Modalities: Group and Pet Therapy  Therapeutic Interventions: Psychoeducation  Evaluation of Outcomes: Progressing   LCSW Treatment Plan for Primary Diagnosis: MDD (major depressive disorder), single episode, severe with psychosis ([place]) Long Term Goal(s): Safe transition to appropriate next level of care at discharge, Engage patient in therapeutic group addressing interpersonal concerns.  Short Term Goals: Engage patient in aftercare planning with referrals and resources  Therapeutic Interventions: Assess for all discharge needs, 1 to 1 time with Social worker, Explore available resources and support systems, Assess for adequacy in community support network, Educate family and significant other(s) on suicide prevention, Complete Psychosocial Assessment, Interpersonal group  therapy.  Evaluation of Outcomes: Met  Return home with grandfather, follow up [facility]   Progress in Treatment: Attending groups: No Participating in groups: No Taking medication as prescribed: Yes Toleration medication: Yes, no side effects reported at this time Family/Significant other contact made: Yes Patient understands diagnosis: No  Limited insight Discussing patient identified problems/goals with staff: Yes Medical problems stabilized or resolved: Yes Denies suicidal/homicidal ideation: Yes Issues/concerns per patient self-inventory: None Other: N/A  New problem(s) identified: None identified at this time.   New Short Term/Long Term Goal(s): None identified at this time.   Discharge Plan or Barriers:   Reason for Continuation of Hospitalization:  Depression Hallucinations Medication stabilization Suicidal ideation   Estimated Length of Stay: 3-5 days  Attendees: Patient: 07/15/2016  1:21 PM  Physician: Ursula Alert, MD 07/15/2016  1:21 PM  Nursing: Hoy Register, RN 07/15/2016  1:21 PM  RN Care Manager: Lars Pinks, RN 07/15/2016  1:21 PM  Social Worker: Ripley Fraise 07/15/2016  1:21 PM  Recreational Therapist: Laretta Bolster  07/15/2016  1:21 PM  Other: Norberto Sorenson 07/15/2016  1:21 PM  Other:  07/15/2016  1:21 PM    Scribe for Treatment Team:  Roque Lias LCSW 07/15/2016 1:21 PM
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
## 2016-07-15 NOTE — Progress Notes (Signed)
Philip Gonzalez is a 51 year old male being admitted involuntarily to [facility] from [facility].  He came in today accompanied by his grandfather for paranoia and feeling that people are trying to kill him.  He had been up all night pacing and checking windows/doors.  He reported that he has been using cocaine daily, molly's 2 times per week and drinking a bottle of wine 2 times per week.  He reported that he was seeing lime green demons outside at his house and also at the hospital.  He is afraid that drug dealers are trying to kill him and while he was at the ED he was afraid for his life and tried to leave.  He was restrained by the police because of his elopement attempt.  He has a history of PTSD from the [employer].  He denies SI/HI.  He currently denies any A/V hallucinations.  He was pleasant and cooperative.  He reported history of HTN, GERD and chronic back and neck pain.  He is diagnosed with Post Traumatic Stress Disorder and Cocaine Use disorder.  Oriented him to the unit.  Admission paperwork completed and signed.  Belongings searched and secured in locker # 13. Skin assessment completed and no skin issues noted.  Q 15 minute checks initiated for safety.  We will monitor the progress towards his goals.

## 2016-07-15 NOTE — Tx Team (Signed)
Interdisciplinary Treatment and Diagnostic Plan Update  07/15/2016 Time of Session: 12:59 PM  Philip Gonzalez MRN: [id number]  Principal Diagnosis: MDD (major depressive disorder), single episode, severe with psychosis ([place])  Secondary Diagnoses: Principal Problem:   MDD (major depressive disorder), single episode, severe with psychosis ([place]) Active Problems:   PTSD (post-traumatic stress disorder)   MDMA abuse   Cannabis use disorder, severe, dependence ([place])   Alcohol use disorder, moderate, dependence ([place])   Tobacco use disorder   Benzodiazepine abuse   Chronic back pain   Essential hypertension   Current Medications:  Current Facility-Administered Medications  Medication Dose Route Frequency Provider Last Rate Last Dose  . acetaminophen (TYLENOL) tablet 650 mg  650 mg Oral Q6H PRN Ethelene Hal, NP   650 mg at 07/15/16 9357  . alum & mag hydroxide-simeth (MAALOX/MYLANTA) 200-200-20 MG/5ML suspension 30 mL  30 mL Oral Q4H PRN Ethelene Hal, NP      . benztropine (COGENTIN) tablet 0.5 mg  0.5 mg Oral QHS Saramma Eappen, MD      . divalproex (DEPAKOTE) DR tablet 250 mg  250 mg Oral Q8H Ethelene Hal, NP   250 mg at 07/15/16 0177  . haloperidol (HALDOL) tablet 5 mg  5 mg Oral Q8H PRN Ursula Alert, MD       Or  . haloperidol lactate (HALDOL) injection 5 mg  5 mg Intramuscular Q8H PRN Ursula Alert, MD      . hydrochlorothiazide (MICROZIDE) capsule 12.5 mg  12.5 mg Oral Daily Saramma Eappen, MD      . hydrOXYzine (ATARAX/VISTARIL) tablet 25 mg  25 mg Oral Q6H PRN Ursula Alert, MD      . Derrill Memo ON 07/16/2016] Influenza vac split quadrivalent PF (FLUARIX) injection 0.5 mL  0.5 mL Intramuscular Tomorrow-1000 Saramma Eappen, MD      . loperamide (IMODIUM) capsule 2-4 mg  2-4 mg Oral PRN Saramma Eappen, MD      . LORazepam (ATIVAN) tablet 1 mg  1 mg Oral Q6H PRN Saramma Eappen, MD      . LORazepam (ATIVAN) tablet 1 mg  1 mg Oral QID Ursula Alert, MD   1 mg at  07/15/16 1209   Followed by  . [START ON 07/16/2016] LORazepam (ATIVAN) tablet 1 mg  1 mg Oral TID Ursula Alert, MD       Followed by  . [START ON 07/17/2016] LORazepam (ATIVAN) tablet 1 mg  1 mg Oral BID Ursula Alert, MD       Followed by  . [START ON 07/19/2016] LORazepam (ATIVAN) tablet 1 mg  1 mg Oral Daily Saramma Eappen, MD      . magnesium hydroxide (MILK OF MAGNESIA) suspension 30 mL  30 mL Oral Daily PRN Ethelene Hal, NP      . multivitamin with minerals tablet 1 tablet  1 tablet Oral Daily Ursula Alert, MD   1 tablet at 07/15/16 1209  . nicotine (NICODERM CQ - dosed in mg/24 hours) patch 21 mg  21 mg Transdermal Daily Ursula Alert, MD   21 mg at 07/15/16 0806  . ondansetron (ZOFRAN-ODT) disintegrating tablet 4 mg  4 mg Oral Q6H PRN Ursula Alert, MD      . Derrill Memo ON 07/16/2016] pneumococcal 23 valent vaccine (PNU-IMMUNE) injection 0.5 mL  0.5 mL Intramuscular Tomorrow-1000 Saramma Eappen, MD      . risperiDONE (RISPERDAL) tablet 0.5 mg  0.5 mg Oral BID Ursula Alert, MD      . risperiDONE (RISPERDAL) tablet  1 mg  1 mg Oral QHS Ursula Alert, MD      . Derrill Memo ON 07/16/2016] thiamine (VITAMIN B-1) tablet 100 mg  100 mg Oral Daily Saramma Eappen, MD      . traZODone (DESYREL) tablet 100 mg  100 mg Oral QHS PRN Ursula Alert, MD        PTA Medications: Prescriptions Prior to Admission  Medication Sig Dispense Refill Last Dose  . aspirin 325 MG tablet Take 650 mg by mouth daily.    07/11/2016 at Unknown time  . hydrochlorothiazide (HYDRODIURIL) 25 MG tablet Take 25 mg by mouth daily.   07/13/2016 at Unknown time  . omeprazole (PRILOSEC) 20 MG capsule Take 20 mg by mouth daily.   Past Week at Unknown time  . traMADol (ULTRAM) 50 MG tablet Take 1 tablet (50 mg total) by mouth every 6 (six) hours as needed. 12 tablet 0 unknown at Unknown time    Treatment Modalities: Medication Management, Group therapy, Case management,  1 to 1 session with clinician, Psychoeducation,  Recreational therapy.   Physician Treatment Plan for Primary Diagnosis: MDD (major depressive disorder), single episode, severe with psychosis ([place]) Long Term Goal(s): Improvement in symptoms so as ready for discharge  Short Term Goals: Ability to verbalize feelings will improve and Compliance with prescribed medications will improve  Medication Management: Evaluate patient's response, side effects, and tolerance of medication regimen.  Therapeutic Interventions: 1 to 1 sessions, Unit Group sessions and Medication administration.  Evaluation of Outcomes: Not Met  Physician Treatment Plan for Secondary Diagnosis: Principal Problem:   MDD (major depressive disorder), single episode, severe with psychosis ([place]) Active Problems:   PTSD (post-traumatic stress disorder)   MDMA abuse   Cannabis use disorder, severe, dependence ([place])   Alcohol use disorder, moderate, dependence ([place])   Tobacco use disorder   Benzodiazepine abuse   Chronic back pain   Essential hypertension   Long Term Goal(s): Improvement in symptoms so as ready for discharge  Short Term Goals: Ability to identify triggers associated with substance abuse/mental health issues will improve  Medication Management: Evaluate patient's response, side effects, and tolerance of medication regimen.  Therapeutic Interventions: 1 to 1 sessions, Unit Group sessions and Medication administration.  Evaluation of Outcomes: Not Met   RN Treatment Plan for Primary Diagnosis: MDD (major depressive disorder), single episode, severe with psychosis ([place]) Long Term Goal(s): Knowledge of disease and therapeutic regimen to maintain health will improve  Short Term Goals: Ability to verbalize feelings will improve and Compliance with prescribed medications will improve  Medication Management: RN will administer medications as ordered by provider, will assess and evaluate patient's response and provide education to patient for prescribed  medication. RN will report any adverse and/or side effects to prescribing provider.  Therapeutic Interventions: 1 on 1 counseling sessions, Psychoeducation, Medication administration, Evaluate responses to treatment, Monitor vital signs and CBGs as ordered, Perform/monitor CIWA, COWS, AIMS and Fall Risk screenings as ordered, Perform wound care treatments as ordered.  Evaluation of Outcomes: Not Met   LCSW Treatment Plan for Primary Diagnosis: MDD (major depressive disorder), single episode, severe with psychosis ([place]) Long Term Goal(s): Safe transition to appropriate next level of care at discharge, Engage patient in therapeutic group addressing interpersonal concerns.  Short Term Goals: Engage patient in aftercare planning with referrals and resources and Increase skills for wellness and recovery  Therapeutic Interventions: Assess for all discharge needs, 1 to 1 time with Social worker, Explore available resources and support systems, Assess for adequacy  in community support network, Educate family and significant other(s) on suicide prevention, Complete Psychosocial Assessment, Interpersonal group therapy.  Evaluation of Outcomes: Not Met   Progress in Treatment: Attending groups: Yes Participating in groups: Yes Taking medication as prescribed: Yes, MD continues to assess for medication changes as needed Toleration medication: Yes, no side effects reported at this time Family/Significant other contact made: Yes, Uthman Mroczkowski ( grandfather, [phone]) Patient understands diagnosis: Limited insight  Discussing patient identified problems/goals with staff: Yes Medical problems stabilized or resolved: Yes Denies suicidal/homicidal ideation: Yes Issues/concerns per patient self-inventory: None Other: N/A  New problem(s) identified: None identified at this time.   New Short Term/Long Term Goal(s): None identified at this time.   Discharge Plan or Barriers: Return home and follow up  with [facility]  Reason for Continuation of Hospitalization: Anxiety Depression Hallucinations Medication stabilization    Estimated Length of Stay: 3-5 days  Attendees: Patient: 07/15/2016  12:59 PM  Physician: Dr. Shea Evans 07/15/2016  12:59 PM  Nursing: Estill Bamberg. Loletha Grayer, RN  07/15/2016  12:59 PM  RN Care Manager: Lars Pinks 07/15/2016  12:59 PM  Social Worker: Ripley Fraise, LCSW 07/15/2016  12:59 PM  Recreational Therapist: Winfield Cunas 07/15/2016  12:59 PM  Other: Radonna Ricker, Social Work Intern  07/15/2016  12:59 PM  Other:  07/15/2016  12:59 PM  Other: 07/15/2016  12:59 PM    Scribe for Treatment Team: Radonna Ricker, Social Work Intern 07/15/2016 12:59 PM

## 2016-07-15 NOTE — Progress Notes (Signed)
Recreation Therapy Notes  INPATIENT RECREATION THERAPY ASSESSMENT  Patient Details Name: Philip Gonzalez MRN: [id number] DOB: 09-15-1965 Today's Date: 07/15/2016  Patient Stressors: Other (Comment) (Certain people)  Pt stated [facility].  Coping Skills:   Substance Abuse, Avoidance, Exercise, Talking, Music, Sports  Personal Challenges: Anger, Communication, Concentration, Decision-Making, Expressing Yourself, Problem-Solving, Self-Esteem/Confidence, Social Interaction, Stress Management, Substance Abuse, Time Management, Trusting Others, Work Nutritional therapisterformance  Leisure Interests (2+):  Games - Cards, Individual - Other (Comment), Games - Other (Comment) (Watch sports, play dominoes)  Awareness of Community Resources:  Yes  Community Resources:  Park, Other (Comment) (Track)  Current Use: No  If no, Barriers?: Other (Comment) (On house arrest)  Patient Strengths:  Sing  Patient Identified Areas of Improvement:  "Being able to focus on life and be disciplend"  Current Recreation Participation:  2-3 times a week  Patient Goal for Hospitalization:  "Clear my mind, get myself back to where I can take care of myself"  [place]  [place]  Current [place] (including self-harm):  No  Current HI:  No  Consent to Intern Participation: N/A   Philip Gonzalez, Philip Gonzalez  Philip RancherLindsay, Lorenzo Arscott A 07/15/2016, 1:53 PM

## 2016-07-15 NOTE — [facility] Suicide Risk Assessment (Signed)
[facility] INPATIENT:  Family/Significant Other Suicide Prevention Education  Suicide Prevention Education:  Education Completed; No one  has been identified by the patient as the family member/significant other with whom the patient will be residing, and identified as the person(s) who will aid the patient in the event of a mental health crisis (suicidal ideations/suicide attempt).  With written consent from the patient, the family member/significant other has been provided the following suicide prevention education, prior to the and/or following the discharge of the patient.  The suicide prevention education provided includes the following:  Suicide risk factors  Suicide prevention and interventions  National Suicide Hotline telephone number  [facility] assessment telephone number  [facility] Emergency Assistance 911  [facility] and/or Residential Mobile Crisis Unit telephone number  Request made of family/significant other to:  Remove weapons (e.g., guns, rifles, knives), all items previously/currently identified as safety concern.    Remove drugs/medications (over-the-counter, prescriptions, illicit drugs), all items previously/currently identified as a safety concern.  The family member/significant other verbalizes understanding of the suicide prevention education information provided.  The family member/significant other agrees to remove the items of safety concern listed above.  Patient did not endorse any suicidal ideations prior to or during his admission. However, CSW did speak with patient's grandfather Philip Gonzalez [phone]) and discussed patient's disposition and any concerns or questions he had.   Philip Gonzalez 07/15/2016, 2:43 PM

## 2016-07-15 NOTE — Progress Notes (Signed)
Nursing Progress Note 7p-7a  D) Patient presents anxious but is cooperative with Clinical research associatewriter. Patient minimal and circumstantial during conversation. Patient is isolative to his room during shift and observed resting in bed with his eyes closed. Patient awakened for vital signs and medications without issue. Patient states "I need to catch up on my sleep". Patient denies SI/HI/AVH or pain at this time. Patient states he still "hears voices and sees demons" at times. Patient reports he continues to "feel paranoid" but "I know I'm safe here". Patient contracts for safety at this time.   A) Emotional support given. 1:1 interaction and active listening provided. Patient medicated with PM orders as prescribed. Medications reviewed with patient. Patient verbalized understanding of medications without further questions.  Snacks and fluids provided. Opportunities for questions or concerns presented to patient. Patient encouraged to continue to work on treatment goals. Labs, vital signs and patient behavior monitored throughout shift. Patient safety maintained with q15 min safety checks.  R) Patient receptive to interaction with nurse. Patient remains safe on the unit at this time. Patient denies any adverse medication reactions at this time. Patient is resting in bed without complaints. Will continue to monitor.
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
## 2016-07-15 NOTE — [facility] Group Notes (Signed)
[facility] LCSW Group Therapy  07/15/2016  1:05 PM  Type of Therapy:  Group therapy  Participation Level:  Active  Participation Quality:  Attentive  Affect:  Flat  Cognitive:  Oriented  Insight:  Limited  Engagement in Therapy:  Limited  Modes of Intervention:  Discussion, Socialization  Summary of Progress/Problems:  Chaplain was here to lead a group on themes of hope and courage.  Invited.  Chose to not attend.  Daryel Geraldorth, Micael Barb B 07/15/2016 1:11 PM
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
## 2016-07-15 NOTE — Progress Notes (Signed)
Recreation Therapy Notes  Date: 07/15/16 Time: 1000 Location: 500 Hall Dayroom   Group Topic: Communication, Team Building, Problem Solving  Goal Area(s) Addresses:  Patient will effectively work with peer towards shared goal.  Patient will identify skill used to make activity successful.  Patient will identify how skills used during activity can be used to reach post d/c goals.   Intervention: STEM Activity   Activity: [place] [employer]. Patients were provided the following materials: 5 drinking straws, 5 rubber bands, 5 paper clips, 2 index cards, 2 drinking cups, and 2 toilet paper rolls. Using the provided materials patients were asked to build a launching mechanisms to launch a ping pong ball approximately 12 feet. Patients were divided into teams of 3-5.   Education: Pharmacist, communityocial Skills, Building control surveyorDischarge Planning.   Education Outcome: Acknowledges education/In group clarification offered/Needs additional education.   Clinical Observations/Feedback: Pt did not attend group.   Caroll RancherMarjette Altheria Shadoan, LRT/CTRS         Caroll RancherLindsay, Lidia Clavijo A 07/15/2016 11:27 AM

## 2016-07-15 NOTE — Plan of Care (Signed)
Problem: Activity: Goal: Sleeping patterns will improve Outcome: Progressing Patient is resting in bed with his eyes closed. Patient slept for 6 hours last night and reported sleeping "well". Respirations even and unlabored. Patient arousable by voice.

## 2016-07-15 NOTE — Progress Notes (Signed)
Patient ID: Philip Gonzalez, male   DOB: 29-Jan-1966, 51 y.o.   MRN: [id number]  DAR: Pt. Denies SI/HI however does report A/V Hallucinations. He reports he hears voices, however they are not telling him anything. He also reports visual hallucinations in the form of demons and paranoia. Patient does report pain this morning from " a pinched nerve." He received PRN Tylenol. He reports sleep is fair, appetite is good, energy level is normal, and concentration is good. He rates depression 5/10, hopelessness 6/10, and anxiety 8/10. He remains calm on the unit and is seen intermittently laughing with another peer. He does not appear to be responding to internal stimuli by this writer's observation. Support and encouragement provided to the patient. Scheduled medications administered to patient per physician's orders. Patient had an EKG done this afternoon and tolerated well. He reports withdrawal symptoms including cravings and irritability. He acknowledges that he is paranoid and reports the peer across the hall from the room he was assigned to last night was making him more paranoid. He reports that is why he slept voluntarily in the quiet room last night. Patient was moved to another room and he reports this is a better fit for him. Patient is cooperative and following directions at this time. Q15 minute checks are maintained for safety.

## 2016-07-15 NOTE — Progress Notes (Signed)
Nursing Progress Note 7p-7a  D) [facility]. Patient presents cooperative but appears anxious and paranoid. Patient denies SI/HI/AVH or pain. Patient contracts for safety at this time. Patient medicated with orders as prescribed. Patient provided snacks and fluids. Patient oriented to the unit.   A) Emotional support given. 1:1 interaction and active listening provided. Patient medicated with PM orders as prescribed. Medications reviewed with patient. Patient verbalized understanding of medications without further questions. Opportunities for questions or concerns presented to patient. Labs, vital signs and patient behavior monitored throughout shift. Patient safety maintained with q15 min safety checks.  R) Patient receptive to interaction with nurse. Patient remains safe on the unit at this time. Will continue to monitor.

## 2016-07-15 NOTE — Progress Notes (Signed)
Patient approached nurses station twice stating "there is a man across the hall trying to tell on me". Patient states "I don't know what is going to happen". Patient appears paranoid and restless. Patient asked if he felt safe with his roommate. Patient states "i don't know". Patient offered to sleep in the quiet room. Patient accepted. Patient currently resting in the quiet room. Patient contracts for safety.

## 2016-07-15 NOTE — Progress Notes (Signed)
Patient ID: Philip Gonzalez, male   DOB: [birth year], 51 y.o.   MRN: [id number]    EKG done and placed on patient's chart.
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
## 2016-07-16 DIAGNOSIS — F1210 Cannabis abuse, uncomplicated: Secondary | ICD-10-CM

## 2016-07-16 DIAGNOSIS — F1010 Alcohol abuse, uncomplicated: Secondary | ICD-10-CM

## 2016-07-16 LAB — LIPID PANEL
CHOLESTEROL: 180 mg/dL (ref 0–200)
HDL: 71 mg/dL (ref >40)
LDL Cholesterol: 79 mg/dL (ref 0–99)
Total CHOL/HDL Ratio: 2.5 RATIO
Triglycerides: 148 mg/dL (ref <150)
VLDL: 30 mg/dL (ref 0–40)

## 2016-07-16 LAB — TSH: TSH: 0.525 u[IU]/mL (ref 0.350–4.500)

## 2016-07-16 MED ORDER — WITCH HAZEL-GLYCERIN EX PADS
MEDICATED_PAD | CUTANEOUS | Status: DC | PRN
  Administered 2016-07-17: 10:00:00 via TOPICAL
  Filled 2016-07-16: qty 100

## 2016-07-16 NOTE — Progress Notes (Signed)
[facility] MD Progress Note  07/16/2016 1:18 PM Philip Gonzalez  MRN:  [id number] Subjective: Patient reports " I feeling okay, I just need rest." patient reports intermittent auditory hallucinations ."  Objective: Philip Gonzalez is awake, alert and oriented *3 seen resting in bedroom.  Denies suicidal or homicidal ideation. Admits to [employer] auditory hallucinations.  denies visual hallucination and does not appear to be responding to internal stimuli. Patient reports he is medication compliant and is tolerating medications well.. however feel like one of the pills is making him sleepy.  RN is reports patient is requesting TUCKs pads. Patient denies any complaints at during this assessment. Reports good appetite and reports resting well. Support, encouragement and reassurance was provided.   Principal Problem: MDD (major depressive disorder), single episode, severe with psychosis (HCC) Diagnosis:   Patient Active Problem List   Diagnosis Date Noted  . MDD (major depressive disorder), single episode, severe with psychosis (HCC) [F32.3] 07/15/2016  . 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 Cannabis use disorder, severe, dependence (HCC) [F12.20] 07/15/2016  . Alcohol use disorder, moderate, dependence (HCC) [F10.20] 07/15/2016  . Tobacco use disorder [F17.200] 07/15/2016  . Benzodiazepine abuse [F13.10] 07/15/2016  . Chronic back pain [M54.9, G89.29] 07/15/2016  . Essential hypertension [I10] 07/15/2016  . PTSD (post-traumatic stress disorder) [F43.10] 07/14/2016   Total Time spent with patient: 30 minutes  Past Psychiatric History:   Past Medical History:  Past Medical History:  Diagnosis Date  . Acid reflux   . Hemorrhoid   . Hypertension   . PTSD (post-traumatic stress disorder)   . STD (male)    History reviewed. No pertinent surgical history. Family History:  Family History  Problem Relation Age of Onset  . Cancer Mother   . Depression Mother   . Depression Maternal Aunt    Family  Psychiatric  History:  Social History:  History  Alcohol Use  . Yes    Comment: occ     History  Drug Use No    Social History   Social History  . Marital status: Divorced    Spouse name: N/A  . Number of children: N/A  . Years of education: N/A   Social History Main Topics  . Smoking status: Smoker, Current Status Unknown    Packs/day: 1.00    Years: 14.00    Types: Cigarettes    Last attempt to quit: 01/18/2016  . Smokeless tobacco: Never Used  . Alcohol use Yes     Comment: occ  . Drug use: No  . Sexual activity: Yes    Birth control/ protection: Condom   Other Topics Concern  . None   Social History Narrative  . None   Additional Social History:    Pain Medications: denies Prescriptions: see MAR Over the Counter: see MAR History of alcohol / drug use?: Yes Name of Substance 1: Cocaine 1 - Age of First Use: unknown 1 - Amount (size/oz): varies 1 - Frequency: daily 1 - Duration: years 1 - Last Use / Amount: yesterday Name of Substance 2: Molly's 2 - Age of First Use: unknown 2 - Amount (size/oz): varies 2 - Frequency: two times per week 2 - Duration: years 2 - Last Use / Amount: unknown Name of Substance 3: alcohol (wine) 3 - Age of First Use: unknown 3 - Amount (size/oz): 1 bottle 3 - Frequency: 2 times per week 3 - Duration: years 3 - Last Use / Amount: unknown  Sleep: Fair  Appetite:  Fair  Current Medications: Current Facility-Administered Medications  Medication Dose Route Frequency Provider Last Rate Last Dose  . acetaminophen (TYLENOL) tablet 650 mg  650 mg Oral Q6H PRN Laveda AbbeLaurie Britton Parks, NP   650 mg at 07/15/16 [id number]  . alum & mag hydroxide-simeth (MAALOX/MYLANTA) 200-200-20 MG/5ML suspension 30 mL  30 mL Oral Q4H PRN Laveda AbbeLaurie Britton Parks, NP      . benztropine (COGENTIN) tablet 0.5 mg  0.5 mg Oral QHS Jomarie LongsSaramma Eappen, MD   0.5 mg at 07/15/16 2124  . citalopram (CELEXA) tablet 10 mg  10 mg Oral Daily Jomarie LongsSaramma Eappen,  MD   10 mg at 07/16/16 0808  . divalproex (DEPAKOTE) DR tablet 250 mg  250 mg Oral Q8H Laveda AbbeLaurie Britton Parks, NP   250 mg at 07/16/16 0557  . haloperidol (HALDOL) tablet 5 mg  5 mg Oral Q8H PRN Jomarie LongsSaramma Eappen, MD       Or  . haloperidol lactate (HALDOL) injection 5 mg  5 mg Intramuscular Q8H PRN Jomarie LongsSaramma Eappen, MD      . hydrochlorothiazide (MICROZIDE) capsule 12.5 mg  12.5 mg Oral Daily Jomarie LongsSaramma Eappen, MD   12.5 mg at 07/16/16 0808  . hydrOXYzine (ATARAX/VISTARIL) tablet 25 mg  25 mg Oral Q6H PRN Jomarie LongsSaramma Eappen, MD      . loperamide (IMODIUM) capsule 2-4 mg  2-4 mg Oral PRN Jomarie LongsSaramma Eappen, MD      . LORazepam (ATIVAN) tablet 1 mg  1 mg Oral Q6H PRN Saramma Eappen, MD      . LORazepam (ATIVAN) tablet 1 mg  1 mg Oral TID Jomarie LongsSaramma Eappen, MD   1 mg at 07/16/16 1206   Followed by  . [START ON 07/17/2016] LORazepam (ATIVAN) tablet 1 mg  1 mg Oral BID Jomarie LongsSaramma Eappen, MD       Followed by  . [START ON 07/19/2016] LORazepam (ATIVAN) tablet 1 mg  1 mg Oral Daily Saramma Eappen, MD      . magnesium hydroxide (MILK OF MAGNESIA) suspension 30 mL  30 mL Oral Daily PRN Laveda AbbeLaurie Britton Parks, NP      . multivitamin with minerals tablet 1 tablet  1 tablet Oral Daily Jomarie LongsSaramma Eappen, MD   1 tablet at 07/16/16 0808  . nicotine (NICODERM CQ - dosed in mg/24 hours) patch 21 mg  21 mg Transdermal Daily Jomarie LongsSaramma Eappen, MD   21 mg at 07/16/16 0807  . ondansetron (ZOFRAN-ODT) disintegrating tablet 4 mg  4 mg Oral Q6H PRN Jomarie LongsSaramma Eappen, MD      . risperiDONE (RISPERDAL) tablet 0.5 mg  0.5 mg Oral BID Jomarie LongsSaramma Eappen, MD   0.5 mg at 07/16/16 0808  . risperiDONE (RISPERDAL) tablet 1 mg  1 mg Oral QHS Jomarie LongsSaramma Eappen, MD   1 mg at 07/15/16 2124  . sodium chloride (OCEAN) 0.65 % nasal spray 1 spray  1 spray Each Nare PRN Jomarie LongsSaramma Eappen, MD   1 spray at 07/15/16 1815  . thiamine (VITAMIN B-1) tablet 100 mg  100 mg Oral Daily Jomarie LongsSaramma Eappen, MD   100 mg at 07/16/16 0808  . traZODone (DESYREL) tablet 100 mg  100 mg Oral QHS PRN  Jomarie LongsSaramma Eappen, MD        Lab Results:  Results for orders placed or performed during the hospital encounter of 07/14/16 (from the past 48 hour(s))  TSH     Status: None   Collection Time: 07/16/16  6:30 AM  Result Value Ref Range   TSH 0.525 0.350 -  4.500 uIU/mL    Comment: Performed by a 3rd Generation assay with a functional sensitivity of <=0.01 uIU/mL. Performed at [facility], [address] [place], [place] [address]     Blood Alcohol level:  Lab Results  Component Value Date   ETH <5 07/14/2016   ETH <5 07/13/2016    Metabolic Disorder Labs: No results found for: HGBA1C, MPG No results found for: PROLACTIN No results found for: CHOL, TRIG, HDL, CHOLHDL, VLDL, LDLCALC  Physical Findings: AIMS: Facial and Oral Movements Muscles of Facial Expression: None, normal Lips and Perioral Area: None, normal Jaw: None, normal Tongue: None, normal,Extremity Movements Upper (arms, wrists, hands, fingers): None, normal Lower (legs, knees, ankles, toes): None, normal, Trunk Movements Neck, shoulders, hips: None, normal, Overall Severity Severity of abnormal movements (highest score from questions above): None, normal Incapacitation due to abnormal movements: None, normal Patient's awareness of abnormal movements (rate only patient's report): No Awareness, Dental Status Current problems with teeth and/or dentures?: No Does patient usually wear dentures?: No  CIWA:  CIWA-Ar Total: 3 COWS:     Musculoskeletal: Strength & Muscle Tone: within normal limits Gait & Station: normal Patient leans: N/A  Psychiatric Specialty Exam: Physical Exam  Nursing note and vitals reviewed. Constitutional: He is oriented to person, place, and time. He appears well-developed.  Neurological: He is alert and oriented to person, place, and time.  Psychiatric: He has a normal mood and affect. His behavior is normal.    Review of Systems  Psychiatric/Behavioral: Positive for  depression. The patient is nervous/anxious.     Blood pressure 112/71, pulse (!) 104, temperature 97.8 F (36.6 C), temperature source Oral, resp. rate 18, height 5\' 7"  (1.702 m), weight 81.6 kg (180 lb), SpO2 99 %.Body mass index is 28.19 kg/m.  General Appearance: Guarded  Eye Contact:  Fair  Speech:  Clear and Coherent  Volume:  Normal  Mood:  Depressed and Irritable  Affect:  Congruent  Thought Process:  Coherent  Orientation:  Full (Time, Place, and Person)  Thought Content:  Hallucinations: Auditory  Suicidal Thoughts:  No  Homicidal Thoughts:  No  Memory:  Immediate;   Fair Remote;   Fair  Judgement:  Fair  Insight:  Present  Psychomotor Activity:  Restlessness  Concentration:  Concentration: Fair  Recall:  [employer] of Knowledge:  Fair  Language:  Fair  Akathisia:  No  Handed:  Right  AIMS (if indicated):     Assets:  Communication Skills Desire for Improvement Resilience Social Support  ADL's:  Intact  Cognition:  WNL  Sleep:  Number of Hours: 6.75     I agree with current treatment plan on 07/16/2016, Patient seen face-to-face for psychiatric evaluation follow-up, chart reviewed. Reviewed the information documented and agree with the treatment plan.  Treatment Plan Summary: Daily contact with patient to assess and evaluate symptoms and progress in treatment and Medication management   Continue treatment plan on 07/16/2016 as listed below except where noted.   Estimated length of stay is 5-7 days.   Reviewed past medical records,treatment plan.   For psychosis: Risperidone 1 mg po qhs and 0.5 mg po bid. Cogentin 0.5 mg po daily for EPS.  For insomnia: Continue Trazodone 100 mg po qhs prn.  For PTSD: Celexa 10 mg po daily. [facility]  For MDD: Continue Celexa 10 mg po daily. Risperidone to augment celexa as well as for psychosis.  For substance abuse- MDMA, Alcohol, bzd, cannabis , ?  Cocaine  Provided  substance abuse counseling when he is more alert. Will refer to substance abuse treatment program. CIWA/Ativan protocol for alcohol/bzd withdrawal sx.  For tobacco use do: Pt decline nicotine patch.  For seizure disorder: Depakote 250 mg po q8h. Depakote level in 5 days.  For essential HTN: Restart HCTZ 12.5 mg po daily.Pt was noncompliant /as per past records in EHR was on HCTZ.  Will continue to monitor vitals ,medication compliance and treatment side effects while patient is here.   Will monitor for medical issues as well as call consult as needed.   Reviewed labs cbc - hb, hct - low , likely chronic anemia , cmp - shows AST - elevated - likely due to substance abuse , UDS - Pos for cocaine, bzd, will order tsh, lipid panel, hba1c, pl.  Will get EKG for qtc monitoring.  CSW will start working on disposition.   Patient to participate in therapeutic milieu .   Oneta Rack, NP 07/16/2016, 1:18 PM

## 2016-07-16 NOTE — Progress Notes (Signed)
DAR NOTE: Patient presents with flat affect and depressed mood.  Reports hearing voice.  Denies visual hallucination and suicidal thoughts.  Rates depression at 5, hopelessness at 4, and anxiety at 7.  Maintained on routine safety checks.  Medications given as prescribed.  Support and encouragement offered as needed.  Patient remained withdrawn and isolative.

## 2016-07-16 NOTE — [facility] Group Notes (Signed)
[facility] Group Notes: (Clinical Social Work)   07/16/2016      Type of Therapy:  Group Therapy   Participation Level:  Did Not Attend despite MHT prompting   Ambrose MantleMareida Grossman-Orr, LCSW 07/16/2016, 1:10 PM

## 2016-07-16 NOTE — Progress Notes (Signed)
Nursing Progress Note 7p-7a  D) Patient presents with flat affect but is cooperative with Clinical research associatewriter. Patient reports feeling "very sleepy" and states "I don't usually sleep this much at home". Patient states "I guess my body needs it." Patient denies SI/HI but endorses hearing occasional voices. Patient denies pain. Patient contracts for safety at this time. Patient did not attend group. Patient isolated to his room and was resting in bed this evening. Snacks and juice brought to patient's room by Clinical research associatewriter.  A) Emotional support given. 1:1 interaction and active listening provided. Patient medicated with PM orders as prescribed. Medications reviewed with patient. Patient verbalized understanding of medications without further questions.  Snacks and fluids provided. Opportunities for questions or concerns presented to patient. Patient encouraged to continue to work on treatment goals. Labs, vital signs and patient behavior monitored throughout shift. Patient safety maintained with q15 min safety checks.  R) Patient receptive to interaction with nurse. Patient remains safe on the unit at this time. Patient denies any adverse medication reactions at this time. Patient is resting in bed without complaints. Will continue to monitor.

## 2016-07-16 NOTE — Progress Notes (Signed)
Did not attend groups 

## 2016-07-16 NOTE — Plan of Care (Signed)
Problem: Education: Goal: Mental status will improve Outcome: Progressing Patient denies visual hallucinations. Patient states his auditory hallucinations have decreased. Patient making less paranoid statements and appears less anxious this evening than during previous shifts.

## 2016-07-17 LAB — HEMOGLOBIN A1C
Hgb A1c MFr Bld: 5.2 % (ref 4.8–5.6)
Mean Plasma Glucose: 103 mg/dL

## 2016-07-17 MED ORDER — CITALOPRAM HYDROBROMIDE 20 MG PO TABS
20.0000 mg | ORAL_TABLET | Freq: Every day | ORAL | Status: DC
  Administered 2016-07-18 – 2016-07-22 (×5): 20 mg via ORAL
  Filled 2016-07-17 (×5): qty 1
  Filled 2016-07-17: qty 7
  Filled 2016-07-17: qty 1

## 2016-07-17 NOTE — Progress Notes (Signed)
Adult Psychoeducational Group Note  Date:  07/17/2016 Time:  8:59 PM  Group Topic/Focus:  Wrap-Up Group:   The focus of this group is to help patients review their daily goal of treatment and discuss progress on daily workbooks.  Participation Level:  Did Not Attend  Additional Comments:  Pt was invited to attend, however pt was in bed.  Belvia Gotschall C 07/17/2016, 8:59 PM 

## 2016-07-17 NOTE — Progress Notes (Signed)
Patient ID: Philip FittingOscar G Gonzalez, male   DOB: 1965-06-09, 51 y.o.   MRN: [id number]    D: Pt has been very flat and depressed on the unit today, he did not attend any groups nor did he engage in any treatment. Pt did start the morning very irritable due to his tucks pads, he requested to have them in his room. Tanika NP made aware, orders noted to allow patient to have pads in his room. Pt reported that his depression was a bad, his hopelessness was a bad, and his anxiety was a bad. Pt reported that he had no goal. Pt reported being negative SI/HI, no AH/VH noted. A: 15 min checks continued for patient safety. R: Pt safety maintained.

## 2016-07-17 NOTE — [facility] Group Notes (Signed)
[facility] Group Notes: (Clinical Social Work)   07/17/2016      Type of Therapy:  Group Therapy   Participation Level:  Did Not Attend despite MHT prompting   Ambrose MantleMareida Grossman-Orr, LCSW 07/17/2016, 12:46 PM

## 2016-07-17 NOTE — Progress Notes (Signed)
[facility] MD Progress Note  07/17/2016 12:56 PM Philip Gonzalez  MRN:  [id number]   Subjective: Patient reports " I am fine, I need some tucks pads."   Objective: Philip Gonzalez is awake, alert and oriented *3. Seen resting in bedroom. Patient is not engaged in this discussion. Patient eyes are closed when answering question.  Denies suicidal or homicidal ideation. continues to report "muffled" auditory hallucinations. States voice has become a bit quitter. Patient denies  visual hallucination and does not appear to be responding to internal stimuli. Patient appears irritated and guarded during this assessment. Patient reports he is medication compliant and is tolerating medications well.Reports good appetite and reports resting well. Support, encouragement and reassurance was provided.   Principal Problem: MDD (major depressive disorder), single episode, severe with psychosis (HCC) Diagnosis:   Patient Active Problem List   Diagnosis Date Noted  . MDD (major depressive disorder), single episode, severe with psychosis (HCC) [F32.3] 07/15/2016  . 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 Cannabis use disorder, severe, dependence (HCC) [F12.20] 07/15/2016  . Alcohol use disorder, moderate, dependence (HCC) [F10.20] 07/15/2016  . Tobacco use disorder [F17.200] 07/15/2016  . Benzodiazepine abuse [F13.10] 07/15/2016  . Chronic back pain [M54.9, G89.29] 07/15/2016  . Essential hypertension [I10] 07/15/2016  . PTSD (post-traumatic stress disorder) [F43.10] 07/14/2016   Total Time spent with patient: 30 minutes  Past Psychiatric History:   Past Medical History:  Past Medical History:  Diagnosis Date  . Acid reflux   . Hemorrhoid   . Hypertension   . PTSD (post-traumatic stress disorder)   . STD (male)    History reviewed. No pertinent surgical history. Family History:  Family History  Problem Relation Age of Onset  . Cancer Mother   . Depression Mother   . Depression Maternal Aunt    Family  Psychiatric  History:  Social History:  History  Alcohol Use  . Yes    Comment: occ     History  Drug Use No    Social History   Social History  . Marital status: Divorced    Spouse name: N/A  . Number of children: N/A  . Years of education: N/A   Social History Main Topics  . Smoking status: Smoker, Current Status Unknown    Packs/day: 1.00    Years: 14.00    Types: Cigarettes    Last attempt to quit: 01/18/2016  . Smokeless tobacco: Never Used  . Alcohol use Yes     Comment: occ  . Drug use: No  . Sexual activity: Yes    Birth control/ protection: Condom   Other Topics Concern  . None   Social History Narrative  . None   Additional Social History:    Pain Medications: denies Prescriptions: see MAR Over the Counter: see MAR History of alcohol / drug use?: Yes Name of Substance 1: Cocaine 1 - Age of First Use: unknown 1 - Amount (size/oz): varies 1 - Frequency: daily 1 - Duration: years 1 - Last Use / Amount: yesterday Name of Substance 2: Molly's 2 - Age of First Use: unknown 2 - Amount (size/oz): varies 2 - Frequency: two times per week 2 - Duration: years 2 - Last Use / Amount: unknown Name of Substance 3: alcohol (wine) 3 - Age of First Use: unknown 3 - Amount (size/oz): 1 bottle 3 - Frequency: 2 times per week 3 - Duration: years 3 - Last Use / Amount: unknown  Sleep: Fair  Appetite:  Fair  Current Medications: Current Facility-Administered Medications  Medication Dose Route Frequency Provider Last Rate Last Dose  . acetaminophen (TYLENOL) tablet 650 mg  650 mg Oral Q6H PRN Laveda AbbeLaurie Britton Parks, NP   650 mg at 07/15/16 [id number]  . alum & mag hydroxide-simeth (MAALOX/MYLANTA) 200-200-20 MG/5ML suspension 30 mL  30 mL Oral Q4H PRN Laveda AbbeLaurie Britton Parks, NP      . benztropine (COGENTIN) tablet 0.5 mg  0.5 mg Oral QHS Jomarie LongsSaramma Eappen, MD   0.5 mg at 07/16/16 2134  . citalopram (CELEXA) tablet 10 mg  10 mg Oral Daily Jomarie LongsSaramma Eappen,  MD   10 mg at 07/17/16 0926  . divalproex (DEPAKOTE) DR tablet 250 mg  250 mg Oral Q8H Laveda AbbeLaurie Britton Parks, NP   250 mg at 07/17/16 [id number]  . haloperidol (HALDOL) tablet 5 mg  5 mg Oral Q8H PRN Jomarie LongsSaramma Eappen, MD       Or  . haloperidol lactate (HALDOL) injection 5 mg  5 mg Intramuscular Q8H PRN Jomarie LongsSaramma Eappen, MD      . hydrochlorothiazide (MICROZIDE) capsule 12.5 mg  12.5 mg Oral Daily Saramma Eappen, MD   12.5 mg at 07/17/16 [id number]  . hydrOXYzine (ATARAX/VISTARIL) tablet 25 mg  25 mg Oral Q6H PRN Jomarie LongsSaramma Eappen, MD      . loperamide (IMODIUM) capsule 2-4 mg  2-4 mg Oral PRN Saramma Eappen, MD      . LORazepam (ATIVAN) tablet 1 mg  1 mg Oral Q6H PRN Saramma Eappen, MD      . LORazepam (ATIVAN) tablet 1 mg  1 mg Oral BID Jomarie LongsSaramma Eappen, MD       Followed by  . [START ON 07/19/2016] LORazepam (ATIVAN) tablet 1 mg  1 mg Oral Daily Saramma Eappen, MD      . magnesium hydroxide (MILK OF MAGNESIA) suspension 30 mL  30 mL Oral Daily PRN Laveda AbbeLaurie Britton Parks, NP      . multivitamin with minerals tablet 1 tablet  1 tablet Oral Daily Jomarie LongsSaramma Eappen, MD   1 tablet at 07/17/16 0926  . nicotine (NICODERM CQ - dosed in mg/24 hours) patch 21 mg  21 mg Transdermal Daily Jomarie LongsSaramma Eappen, MD   21 mg at 07/17/16 0927  . ondansetron (ZOFRAN-ODT) disintegrating tablet 4 mg  4 mg Oral Q6H PRN Jomarie LongsSaramma Eappen, MD      . risperiDONE (RISPERDAL) tablet 0.5 mg  0.5 mg Oral BID Jomarie LongsSaramma Eappen, MD   0.5 mg at 07/17/16 0928  . risperiDONE (RISPERDAL) tablet 1 mg  1 mg Oral QHS Jomarie LongsSaramma Eappen, MD   1 mg at 07/16/16 2136  . sodium chloride (OCEAN) 0.65 % nasal spray 1 spray  1 spray Each Nare PRN Jomarie LongsSaramma Eappen, MD   1 spray at 07/15/16 1815  . thiamine (VITAMIN B-1) tablet 100 mg  100 mg Oral Daily Jomarie LongsSaramma Eappen, MD   100 mg at 07/17/16 0928  . traZODone (DESYREL) tablet 100 mg  100 mg Oral QHS PRN Jomarie LongsSaramma Eappen, MD      . witch hazel-glycerin (TUCKS) pad   Topical PRN Oneta Rackanika N Lewis, NP        Lab Results:  Results for  orders placed or performed during the hospital encounter of 07/14/16 (from the past 48 hour(s))  TSH     Status: None   Collection Time: 07/16/16  6:30 AM  Result Value Ref Range   TSH 0.525 0.350 - 4.500 uIU/mL    Comment: Performed by a 3rd  Generation assay with a functional sensitivity of <=0.01 uIU/mL. Performed at [facility], [address], [place], [place] [address]   Lipid panel     Status: None   Collection Time: 07/16/16  6:30 AM  Result Value Ref Range   Cholesterol 180 0 - 200 mg/dL   Triglycerides 604 <540 mg/dL   HDL 71 >98 mg/dL   Total CHOL/HDL Ratio 2.5 RATIO   VLDL 30 0 - 40 mg/dL   LDL Cholesterol 79 0 - 99 mg/dL    Comment:        Total Cholesterol/HDL:CHD Risk Coronary Heart Disease Risk Table                     Men   Women  1/2 Average Risk   3.4   3.3  Average Risk       5.0   4.4  2 X Average Risk   9.6   7.1  3 X Average Risk  23.4   11.0        Use the calculated Patient Ratio above and the CHD Risk Table to determine the patient's CHD Risk.        ATP III CLASSIFICATION (LDL):  <100     mg/dL   Optimal  119-147  mg/dL   Near or Above                    Optimal  130-159  mg/dL   Borderline  829-562  mg/dL   High  >130     mg/dL   Very High Performed at [facility], [address], [place], [place] [address]   Hemoglobin A1c     Status: None   Collection Time: 07/16/16  6:30 AM  Result Value Ref Range   Hgb A1c MFr Bld 5.2 4.8 - 5.6 %    Comment: (NOTE)         Pre-diabetes: 5.7 - 6.4         Diabetes: >6.4         Glycemic control for adults with diabetes: <7.0    Mean Plasma Glucose 103 mg/dL    Comment: (NOTE) Performed At: [facility] [address] [place], [place] [id number] Mila Homer MD UX:[id number] Performed at [facility], [address], [place], [place] [address]     Blood Alcohol level:  Lab Results  Component Value Date   [facility] <5 07/14/2016   ETH <5  07/13/2016    Metabolic Disorder Labs: Lab Results  Component Value Date   HGBA1C 5.2 07/16/2016   MPG 103 07/16/2016   No results found for: PROLACTIN Lab Results  Component Value Date   CHOL 180 07/16/2016   TRIG 148 07/16/2016   HDL 71 07/16/2016   CHOLHDL 2.5 07/16/2016   VLDL 30 07/16/2016   LDLCALC 79 07/16/2016    Physical Findings: AIMS: Facial and Oral Movements Muscles of Facial Expression: None, normal Lips and Perioral Area: None, normal Jaw: None, normal Tongue: None, normal,Extremity Movements Upper (arms, wrists, hands, fingers): None, normal Lower (legs, knees, ankles, toes): None, normal, Trunk Movements Neck, shoulders, hips: None, normal, Overall Severity Severity of abnormal movements (highest score from questions above): None, normal Incapacitation due to abnormal movements: None, normal Patient's awareness of abnormal movements (rate only patient's report): No Awareness, Dental Status Current problems with teeth and/or dentures?: No Does patient usually wear dentures?: No  CIWA:  CIWA-Ar Total: 6 COWS:  Musculoskeletal: Strength & Muscle Tone: within normal limits Gait & Station: normal Patient leans: N/A  Psychiatric Specialty Exam: Physical Exam  Nursing note and vitals reviewed. Constitutional: He is oriented to person, place, and time. He appears well-developed.  Neurological: He is alert and oriented to person, place, and time.  Psychiatric: He has a normal mood and affect. His behavior is normal.    Review of Systems  Psychiatric/Behavioral: Positive for depression. The patient is nervous/anxious.     Blood pressure 134/78, pulse (!) 107, temperature 98.3 F (36.8 C), temperature source Oral, resp. rate 20, height 5\' 7"  (1.702 m), weight 81.6 kg (180 lb), SpO2 99 %.Body mass index is 28.19 kg/m.  General Appearance: Guarded  Eye Contact:  Minimal  Speech:  Clear and Coherent  Volume:  Normal  Mood:  Depressed and Irritable   Affect:  Congruent  Thought Process:  Coherent  Orientation:  Full (Time, Place, and Person)  Thought Content:  Hallucinations: Auditory  Suicidal Thoughts:  No  Homicidal Thoughts:  No  Memory:  Immediate;   Fair Recent;   Fair Remote;   Fair  Judgement:  Fair  Insight:  Present  Psychomotor Activity:  Restlessness  Concentration:  Concentration: Fair  Recall:  [employer] of Knowledge:  Fair  Language:  Fair  Akathisia:  No  Handed:  Right  AIMS (if indicated):     Assets:  Communication Skills Desire for Improvement Social Support  ADL's:  Intact  Cognition:  WNL  Sleep:  Number of Hours: 6.75     I agree with current treatment plan on 07/17/2016, Patient seen face-to-face for psychiatric evaluation follow-up, chart reviewed. Reviewed the information documented and agree with the treatment plan.  Treatment Plan Summary: Daily contact with patient to assess and evaluate symptoms and progress in treatment and Medication management   Continue treatment plan on 2/18//2018 as listed below except where noted.   Estimated length of stay is 5-7 days.   Reviewed past medical records,treatment plan.   For psychosis: Risperidone 1 mg po qhs and 0.5 mg po bid. Cogentin 0.5 mg po daily for EPS.  For insomnia: Continue Trazodone 100 mg po qhs prn.  For PTSD: Celexa 20 mg po daily. [facility]  For MDD: Increased Celexa 10 mg to 20mg  po daily. Risperidone to augment celexa as well as for psychosis.  For substance abuse- MDMA, Alcohol, bzd, cannabis , ? Cocaine  Provided substance abuse counseling when he is more alert. Will refer to substance abuse treatment program. CIWA/Ativan protocol for alcohol/bzd withdrawal sx.  For tobacco use do: Pt decline nicotine patch.  For seizure disorder: Depakote 250 mg po q8h. Depakote level in 5 days.  For essential HTN: Restart HCTZ 12.5 mg po daily.Pt was noncompliant /as per past  records in EHR was on HCTZ.  Will continue to monitor vitals ,medication compliance and treatment side effects while patient is here.   Will monitor for medical issues as well as call consult as needed.   Reviewed labs cbc - hb, hct - low , likely chronic anemia , cmp - shows AST - elevated - likely due to substance abuse , UDS - Pos for cocaine, bzd, will order tsh, lipid panel, hba1c, pl.  Will get EKG for qtc monitoring.  CSW will start working on disposition.   Patient to participate in therapeutic milieu .   Oneta Rack, NP 07/17/2016, 12:56 PM

## 2016-07-18 LAB — VALPROIC ACID LEVEL: Valproic Acid Lvl: 47 ug/mL — ABNORMAL LOW (ref 50.0–100.0)

## 2016-07-18 LAB — PROLACTIN: Prolactin: 24 ng/mL — ABNORMAL HIGH (ref 4.0–15.2)

## 2016-07-18 MED ORDER — DIVALPROEX SODIUM 500 MG PO DR TAB
500.0000 mg | DELAYED_RELEASE_TABLET | Freq: Two times a day (BID) | ORAL | Status: DC
  Administered 2016-07-19 – 2016-07-22 (×7): 500 mg via ORAL
  Filled 2016-07-18 (×8): qty 1
  Filled 2016-07-18: qty 14
  Filled 2016-07-18: qty 1
  Filled 2016-07-18: qty 14

## 2016-07-18 MED ORDER — DIVALPROEX SODIUM 500 MG PO DR TAB
500.0000 mg | DELAYED_RELEASE_TABLET | Freq: Every day | ORAL | Status: AC
  Administered 2016-07-18: 500 mg via ORAL
  Filled 2016-07-18: qty 1

## 2016-07-18 NOTE — Progress Notes (Signed)
Patient denies SI, HI, and AVH.  Patient has been engaged in groups and other unit activities.  Patient interaction with writer was brief and he had minimal eye contact.    Assess patient for safety, offer medications as prescribed, engage patient in 1:1 staff talks.    Patient able to contract for safety. Continue to monitor as planned.

## 2016-07-18 NOTE — Progress Notes (Signed)
D: Pt was flat, isolative and withdrawn to room; remained in bed for most of the evening. Pt at the time of assessment denied depression, anxiety, pain, SI, HI or AVH; states, "I have been sleeping; I have had no time to thi9nk about anything." Pt remained oriented and cooperative. A: Medications offered as prescribed.  Support, encouragement, and safe environment provided.  15-minute safety checks continue. R: Pt was med compliant.  Pt did not attend group. Safety checks continue.

## 2016-07-18 NOTE — [facility] Group Notes (Signed)
[facility] LCSW Group Therapy  07/18/2016 1:15 pm  Type of Therapy: Process Group Therapy  Participation Level:  Active  Participation Quality:  Appropriate  Affect:  Flat  Cognitive:  Oriented  Insight:  Improving  Engagement in Group:  Limited  Engagement in Therapy:  Limited  Modes of Intervention:  Activity, Clarification, Education, Problem-solving and Support  Summary of Progress/Problems: Today's group addressed the issue of overcoming obstacles.  Patients were asked to identify their biggest obstacle post d/c that stands in the way of their on-going success, and then problem solve as to how to manage this. Stayed the entire time, engaged throughout. Talked about his paranoia, the fact that he has been on house arrest for 90 days, his long-standing history of mental illness and PTSD since service days, and how he needs to get back on meds to help.  "I know it s a combination of meds and environment."  Philip Gonzalez, Philip Gonzalez 07/18/2016   4:13 PM

## 2016-07-18 NOTE — Progress Notes (Addendum)
[facility]  07/18/2016 2:25 PM Philip FittingOscar G Gonzalez  MRN:  [id number]   Subjective: Patient states " I feel ok."   Objective: Patient seen and chart reviewed.Discussed patient with treatment team.  Pt today seen in bed, avoids eye contact , appears depressed. Pt however denies new concerns , states his medications are working. Pt reports AH as getting quieter. Per staff , continues to need encouragement to attend groups and participate.   Principal Problem: MDD (major depressive disorder), single episode, severe with psychosis (HCC) Diagnosis:   Patient Active Problem List   Diagnosis Date Noted  . MDD (major depressive disorder), single episode, severe with psychosis (HCC) [F32.3] 07/15/2016  . 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 Cannabis use disorder, severe, dependence (HCC) [F12.20] 07/15/2016  . Alcohol use disorder, moderate, dependence (HCC) [F10.20] 07/15/2016  . Tobacco use disorder [F17.200] 07/15/2016  . Benzodiazepine abuse [F13.10] 07/15/2016  . Chronic back pain [M54.9, G89.29] 07/15/2016  . Essential hypertension [I10] 07/15/2016  . PTSD (post-traumatic stress disorder) [F43.10] 07/14/2016   Total Time spent with patient: 20 minutes  Past Psychiatric History: Please see H&P.   Past Medical History:  Past Medical History:  Diagnosis Date  . Acid reflux   . Hemorrhoid   . Hypertension   . PTSD (post-traumatic stress disorder)   . STD (male)    History reviewed. No pertinent surgical history. Family History:  Family History  Problem Relation Age of Onset  . Cancer Mother   . Depression Mother   . Depression Maternal Aunt    Family Psychiatric  History: Please see H&P.  Social History: Please see H&P.  History  Alcohol Use  . Yes    Comment: occ     History  Drug Use No    Social History   Social History  . Marital status: Divorced    Spouse name: N/A  . Number of children: N/A  . Years of education: N/A   Social History Main Topics   . Smoking status: Smoker, Current Status Unknown    Packs/day: 1.00    Years: 14.00    Types: Cigarettes    Last attempt to quit: 01/18/2016  . Smokeless tobacco: Never Used  . Alcohol use Yes     Comment: occ  . Drug use: No  . Sexual activity: Yes    Birth control/ protection: Condom   Other Topics Concern  . None   Social History Narrative  . None   Additional Social History:    Pain Medications: denies Prescriptions: see MAR Over the Counter: see MAR History of alcohol / drug use?: Yes Name of Substance 1: Cocaine 1 - Age of First Use: unknown 1 - Amount (size/oz): varies 1 - Frequency: daily 1 - Duration: years 1 - Last Use / Amount: yesterday Name of Substance 2: Molly's 2 - Age of First Use: unknown 2 - Amount (size/oz): varies 2 - Frequency: two times per week 2 - Duration: years 2 - Last Use / Amount: unknown Name of Substance 3: alcohol (wine) 3 - Age of First Use: unknown 3 - Amount (size/oz): 1 bottle 3 - Frequency: 2 times per week 3 - Duration: years 3 - Last Use / Amount: unknown              Sleep: Fair  Appetite:  Fair  Current Medications: Current Facility-Administered Medications  Medication Dose Route Frequency Provider Last Rate Last Dose  . acetaminophen (TYLENOL) tablet 650 mg  650 mg Oral Q6H  PRN Laveda Abbe, NP   650 mg at 07/15/16 1610  . alum & mag hydroxide-simeth (MAALOX/MYLANTA) 200-200-20 MG/5ML suspension 30 mL  30 mL Oral Q4H PRN Laveda Abbe, NP      . benztropine (COGENTIN) tablet 0.5 mg  0.5 mg Oral QHS Jomarie Longs, MD   0.5 mg at 07/17/16 2154  . citalopram (CELEXA) tablet 20 mg  20 mg Oral Daily Oneta Rack, NP   20 mg at 07/18/16 9604  . [START ON 07/19/2016] divalproex (DEPAKOTE) DR tablet 500 mg  500 mg Oral Q12H Maalik Pinn, MD      . divalproex (DEPAKOTE) DR tablet 500 mg  500 mg Oral QHS Briunna Leicht, MD      . haloperidol (HALDOL) tablet 5 mg  5 mg Oral Q8H PRN Jomarie Longs, MD        Or  . haloperidol lactate (HALDOL) injection 5 mg  5 mg Intramuscular Q8H PRN Shantelle Alles, MD      . hydrochlorothiazide (MICROZIDE) capsule 12.5 mg  12.5 mg Oral Daily Catelynn Sparger, MD   12.5 mg at 07/18/16 0823  . [START ON 07/19/2016] LORazepam (ATIVAN) tablet 1 mg  1 mg Oral Daily Phil Michels, MD      . magnesium hydroxide (MILK OF MAGNESIA) suspension 30 mL  30 mL Oral Daily PRN Laveda Abbe, NP      . multivitamin with minerals tablet 1 tablet  1 tablet Oral Daily Jomarie Longs, MD   1 tablet at 07/18/16 5409  . nicotine (NICODERM CQ - dosed in mg/24 hours) patch 21 mg  21 mg Transdermal Daily Jomarie Longs, MD   21 mg at 07/18/16 0827  . risperiDONE (RISPERDAL) tablet 0.5 mg  0.5 mg Oral BID Jomarie Longs, MD   0.5 mg at 07/18/16 8119  . risperiDONE (RISPERDAL) tablet 1 mg  1 mg Oral QHS Jomarie Longs, MD   1 mg at 07/17/16 2154  . sodium chloride (OCEAN) 0.65 % nasal spray 1 spray  1 spray Each Nare PRN Jomarie Longs, MD   1 spray at 07/17/16 1315  . thiamine (VITAMIN B-1) tablet 100 mg  100 mg Oral Daily Jomarie Longs, MD   100 mg at 07/18/16 1478  . traZODone (DESYREL) tablet 100 mg  100 mg Oral QHS PRN Jomarie Longs, MD   100 mg at 07/17/16 2154  . witch hazel-glycerin (TUCKS) pad   Topical PRN Oneta Rack, NP        Lab Results:  Results for orders placed or performed during the hospital encounter of 07/14/16 (from the past 48 hour(s))  Valproic acid level     Status: Abnormal   Collection Time: 07/18/16  6:22 AM  Result Value Ref Range   Valproic Acid Lvl 47 (L) 50.0 - 100.0 ug/mL    Comment: Performed at [facility], [address], [place], [place] [address]    Blood Alcohol level:  Lab Results  Component Value Date   [facility] <5 07/14/2016   ETH <5 07/13/2016    Metabolic Disorder Labs: Lab Results  Component Value Date   HGBA1C 5.2 07/16/2016   MPG 103 07/16/2016   Lab Results  Component Value Date   PROLACTIN 24.0 (H)  07/16/2016   Lab Results  Component Value Date   CHOL 180 07/16/2016   TRIG 148 07/16/2016   HDL 71 07/16/2016   CHOLHDL 2.5 07/16/2016   VLDL 30 07/16/2016   LDLCALC 79 07/16/2016  Physical Findings: AIMS: Facial and Oral Movements Muscles of Facial Expression: None, normal Lips and Perioral Area: None, normal Jaw: None, normal Tongue: None, normal,Extremity Movements Upper (arms, wrists, hands, fingers): None, normal Lower (legs, knees, ankles, toes): None, normal, Trunk Movements Neck, shoulders, hips: None, normal, Overall Severity Severity of abnormal movements (highest score from questions above): None, normal Incapacitation due to abnormal movements: None, normal Patient's awareness of abnormal movements (rate only patient's report): No Awareness, Dental Status Current problems with teeth and/or dentures?: No Does patient usually wear dentures?: No  CIWA:  CIWA-Ar Total: 3 COWS:     Musculoskeletal: Strength & Muscle Tone: within normal limits Gait & Station: normal Patient leans: N/A  Psychiatric Specialty Exam: Physical Exam  Nursing note and vitals reviewed.   Review of Systems  Psychiatric/Behavioral: The patient is nervous/anxious.   All other systems reviewed and are negative.   Blood pressure (!) 143/88, pulse 97, temperature 97.8 F (36.6 C), temperature source Oral, resp. rate 18, height 5\' 7"  (1.702 m), weight 81.6 kg (180 lb), SpO2 99 %.Body mass index is 28.19 kg/m.  General Appearance: Guarded  Eye Contact:  Poor  Speech:  Slow  Volume:  Normal  Mood:  Anxious and Depressed  Affect:  Congruent  Thought Process:  Goal Directed and Descriptions of Associations: Circumstantial  Orientation:  Full (Time, Place, and Person)  Thought Content:  Hallucinations: Auditory  Suicidal Thoughts:  No  Homicidal Thoughts:  No  Memory:  Immediate;   Fair Recent;   Fair Remote;   Fair  Judgement:  Fair  Insight:  Fair  Psychomotor Activity:  Decreased   Concentration:  Concentration: Fair and Attention Span: Fair  Recall:  [employer] of Knowledge:  Fair  Language:  Fair  Akathisia:  No  Handed:  Right  AIMS (if indicated):     Assets:  Desire for Improvement Social Support  ADL's:  Intact  Cognition:  WNL  Sleep:  Number of Hours: 5.25   Will continue today 07/18/16 plan as below except where it is noted.   MDD (major depressive disorder), single episode, severe with psychosis (HCC) improving  Treatment Plan Summary: Daily contact with patient to assess and evaluate symptoms and progress in treatment and Medication management     For psychosis:reviewed today  Continue risperidone as noted below. Risperidone 1 mg po qhs and 0.5 mg po bid. Cogentin 0.5 mg po daily for EPS.  For insomnia: Continue Trazodone 100 mg po qhs prn.  For PTSD: Celexa 20 mg po daily. [facility]  For MDD: Will increase Celexa to 20 mg po daily for depression. Depakote 500 mg po bid - depakote level today - low - will repeat in 5 days. Risperidone to augment celexa as well as for psychosis.  For substance abuse- MDMA, Alcohol, bzd, cannabis , ? Cocaine - reviewed - as below . Provided substance abuse counseling when he is more alert. Will refer to substance abuse treatment program. CIWA/Ativan protocol for alcohol/bzd withdrawal sx.  For tobacco use do: Pt decline nicotine patch.  For seizure disorder: Will increase Depakote to 500 mg po bid - depakote level -subtherapeutic- 07/18/16- get another level in 5 days.  Depakote level in 5 days.  For essential HTN: Restart HCTZ 12.5 mg po daily.Pt was noncompliant /as per past records in EHR was on HCTZ.  Will continue to monitor vitals ,medication compliance and treatment side effects while patient is here.   Will monitor for medical  issues as well as call consult as needed.   Reviewed labs cbc - hb, hct - low , likely chronic anemia , cmp - shows  AST - elevated - likely due to substance abuse , UDS - Pos for cocaine, bzd, will order tsh, lipid panel, hba1c, pl.  EKG reviewed - qtc wnl.  CSW will continue working on disposition.  Patient to participate in therapeutic milieu .   Saliyah Gillin, MD 07/18/2016, 2:25 PM

## 2016-07-18 NOTE — Progress Notes (Signed)
Recreation Therapy Notes  Date: 07/18/16 Time: 1000 Location: 500 Hall Dayroom  Group Topic: Wellness  Goal Area(s) Addresses:  Patient will define components of whole wellness. Patient will verbalize benefit of whole wellness.  Intervention: Chair exercises, meditation  Activity: Chair Exercises, Meditation.  LRT introduced chair exercises as a way to get in some physical wellness and meditation as a way to exercise mental wellness.  LRT read script to allow patients to engage in both techniques. Patients were to follow along as LRT read from scripts to engage in both activities.  Education: Wellness, Discharge Planning.   Education Outcome: Acknowledges education/In group clarification offered/Needs additional education.   Clinical Observations/Feedback: Pt did not attend group.   Levon Boettcher, LRT/CTRS         Marlinda Miranda A 07/18/2016 12:45 PM 

## 2016-07-19 NOTE — Tx Team (Signed)
Interdisciplinary Treatment and Diagnostic Plan Update  07/19/2016 Time of Session: 8:59 AM  Philip Gonzalez MRN: [id number]  Principal Diagnosis: MDD (major depressive disorder), single episode, severe with psychosis ([place])  Secondary Diagnoses: Principal Problem:   MDD (major depressive disorder), single episode, severe with psychosis ([place]) Active Problems:   PTSD (post-traumatic stress disorder)   MDMA abuse   Cannabis use disorder, severe, dependence ([place])   Alcohol use disorder, moderate, dependence ([place])   Tobacco use disorder   Benzodiazepine abuse   Chronic back pain   Essential hypertension   Current Medications:  Current Facility-Administered Medications  Medication Dose Route Frequency Provider Last Rate Last Dose  . acetaminophen (TYLENOL) tablet 650 mg  650 mg Oral Q6H PRN Ethelene Hal, NP   650 mg at 07/15/16 1700  . alum & mag hydroxide-simeth (MAALOX/MYLANTA) 200-200-20 MG/5ML suspension 30 mL  30 mL Oral Q4H PRN Ethelene Hal, NP      . benztropine (COGENTIN) tablet 0.5 mg  0.5 mg Oral QHS Ursula Alert, MD   0.5 mg at 07/18/16 2146  . citalopram (CELEXA) tablet 20 mg  20 mg Oral Daily Derrill Center, NP   20 mg at 07/19/16 0748  . divalproex (DEPAKOTE) DR tablet 500 mg  500 mg Oral Q12H Saramma Eappen, MD   500 mg at 07/19/16 0748  . haloperidol (HALDOL) tablet 5 mg  5 mg Oral Q8H PRN Ursula Alert, MD       Or  . haloperidol lactate (HALDOL) injection 5 mg  5 mg Intramuscular Q8H PRN Saramma Eappen, MD      . hydrochlorothiazide (MICROZIDE) capsule 12.5 mg  12.5 mg Oral Daily Saramma Eappen, MD   12.5 mg at 07/19/16 0749  . magnesium hydroxide (MILK OF MAGNESIA) suspension 30 mL  30 mL Oral Daily PRN Ethelene Hal, NP      . multivitamin with minerals tablet 1 tablet  1 tablet Oral Daily Ursula Alert, MD   1 tablet at 07/19/16 0748  . nicotine (NICODERM CQ - dosed in mg/24 hours) patch 21 mg  21 mg Transdermal Daily Ursula Alert, MD   21  mg at 07/19/16 0748  . risperiDONE (RISPERDAL) tablet 0.5 mg  0.5 mg Oral BID Ursula Alert, MD   0.5 mg at 07/19/16 0748  . risperiDONE (RISPERDAL) tablet 1 mg  1 mg Oral QHS Ursula Alert, MD   1 mg at 07/18/16 2146  . sodium chloride (OCEAN) 0.65 % nasal spray 1 spray  1 spray Each Nare PRN Ursula Alert, MD   1 spray at 07/17/16 1315  . thiamine (VITAMIN B-1) tablet 100 mg  100 mg Oral Daily Ursula Alert, MD   100 mg at 07/19/16 0748  . traZODone (DESYREL) tablet 100 mg  100 mg Oral QHS PRN Ursula Alert, MD   100 mg at 07/18/16 2146  . witch hazel-glycerin (TUCKS) pad   Topical PRN Derrill Center, NP        PTA Medications: Prescriptions Prior to Admission  Medication Sig Dispense Refill Last Dose  . aspirin 325 MG tablet Take 650 mg by mouth daily.    07/11/2016 at Unknown time  . hydrochlorothiazide (HYDRODIURIL) 25 MG tablet Take 25 mg by mouth daily.   07/13/2016 at Unknown time  . omeprazole (PRILOSEC) 20 MG capsule Take 20 mg by mouth daily.   Past Week at Unknown time  . traMADol (ULTRAM) 50 MG tablet Take 1 tablet (50 mg total) by mouth every 6 (six)  hours as needed. 12 tablet 0 unknown at Unknown time    Treatment Modalities: Medication Management, Group therapy, Case management,  1 to 1 session with clinician, Psychoeducation, Recreational therapy.   Physician Treatment Plan for Primary Diagnosis: MDD (major depressive disorder), single episode, severe with psychosis ([place]) Long Term Goal(s): Improvement in symptoms so as ready for discharge  Short Term Goals: Ability to verbalize feelings will improve Compliance with prescribed medications will improve Ability to identify triggers associated with substance abuse/mental health issues will improve   Medication Management: Evaluate patient's response, side effects, and tolerance of medication regimen.  Therapeutic Interventions: 1 to 1 sessions, Unit Group sessions and Medication administration.  Evaluation of  Outcomes: Progressing  Physician Treatment Plan for Secondary Diagnosis: Principal Problem:   MDD (major depressive disorder), single episode, severe with psychosis ([place]) Active Problems:   PTSD (post-traumatic stress disorder)   MDMA abuse   Cannabis use disorder, severe, dependence ([place])   Alcohol use disorder, moderate, dependence ([place])   Tobacco use disorder   Benzodiazepine abuse   Chronic back pain   Essential hypertension   Long Term Goal(s): Improvement in symptoms so as ready for discharge  Short Term Goals: Ability to verbalize feelings will improve Compliance with prescribed medications will improve Ability to identify triggers associated with substance abuse/mental health issues will improve   Medication Management: Evaluate patient's response, side effects, and tolerance of medication regimen.  Therapeutic Interventions: 1 to 1 sessions, Unit Group sessions and Medication administration.  Evaluation of Outcomes: Progressing   07/19/16:  For psychosis:reviewed today - continue risperidone as noted below . Risperidone 1 mg po qhs and 0.5 mg po bid. Cogentin 0.5 mg po daily for EPS.  For insomnia:reviewed - continue trazodone. Continue Trazodone 100 mg po qhs prn.  For PTSD:reviewed - continue celexa. Celexa 20 mg po daily. [facility]  For ZOX:[id number] same dose. Celexa to 20 mg po daily for depression. Depakote 500 mg po bid - depakote level on 07/18/16- subtherapeutic - will get another level on 07/22/16- dose increased yesterday. Risperidone to augment celexa as well as for psychosis.  For seizure disorder: Will increase Depakote to 500 mg po bid - depakote level -subtherapeutic- 07/18/16- get another level in 5 days.  Depakote level in 5 days.   RN Treatment Plan for Primary Diagnosis: MDD (major depressive disorder), single episode, severe with psychosis ([place]) Long Term Goal(s): Knowledge of disease and therapeutic  regimen to maintain health will improve  Short Term Goals: Ability to identify and develop effective coping behaviors will improve and Compliance with prescribed medications will improve  Medication Management: RN will administer medications as ordered by provider, will assess and evaluate patient's response and provide education to patient for prescribed medication. RN will report any adverse and/or side effects to prescribing provider.  Therapeutic Interventions: 1 on 1 counseling sessions, Psychoeducation, Medication administration, Evaluate responses to treatment, Monitor vital signs and CBGs as ordered, Perform/monitor CIWA, COWS, AIMS and Fall Risk screenings as ordered, Perform wound care treatments as ordered.  Evaluation of Outcomes: Progressing    Recreational Therapy Treatment Plan for Primary Diagnosis: MDD (major depressive disorder), single episode, severe with psychosis ([place]) Long Term Goal(s): LTG- Patient will participate in recreation therapy tx in at least 2 group sessions without prompting from LRT.  Short Term Goals: Patient will demonstrate increased ability to follow instructions, as demonstrated by ability to follow LRT instructions on first prompt during recreation therapy group sessions.  Treatment Modalities: Group  and Pet Therapy  Therapeutic Interventions: Psychoeducation  Evaluation of Outcomes: Progressing   LCSW Treatment Plan for Primary Diagnosis: MDD (major depressive disorder), single episode, severe with psychosis ([place]) Long Term Goal(s): Safe transition to appropriate next level of care at discharge, Engage patient in therapeutic group addressing interpersonal concerns.  Short Term Goals: Engage patient in aftercare planning with referrals and resources  Therapeutic Interventions: Assess for all discharge needs, 1 to 1 time with Social worker, Explore available resources and support systems, Assess for adequacy in community support network, Educate  family and significant other(s) on suicide prevention, Complete Psychosocial Assessment, Interpersonal group therapy.  Evaluation of Outcomes: Met  Return home with grandfather, follow up [facility]   Progress in Treatment: Attending groups: No Participating in groups: No Taking medication as prescribed: Yes Toleration medication: Yes, no side effects reported at this time Family/Significant other contact made: Yes Patient understands diagnosis: No  Limited insight Discussing patient identified problems/goals with staff: Yes Medical problems stabilized or resolved: Yes Denies suicidal/homicidal ideation: Yes Issues/concerns per patient self-inventory: None Other: N/A  New problem(s) identified: None identified at this time.   New Short Term/Long Term Goal(s): None identified at this time.   Discharge Plan or Barriers:   Reason for Continuation of Hospitalization:  Depression Medication stabilization Suicidal ideation Withdrawal symptoms  Estimated Length of Stay: 3-5 days  Attendees: Patient: 07/19/2016  8:59 AM  Physician: Ursula Alert, MD 07/19/2016  8:59 AM  Nursing: Hoy Register, RN 07/19/2016  8:59 AM  RN Care Manager: Lars Pinks, RN 07/19/2016  8:59 AM  Social Worker: Ripley Fraise 07/19/2016  8:59 AM  Recreational Therapist: Laretta Bolster  07/19/2016  8:59 AM  Other: Norberto Sorenson 07/19/2016  8:59 AM  Other:  07/19/2016  8:59 AM    Scribe for Treatment Team:  Roque Lias LCSW 07/19/2016 8:59 AM

## 2016-07-19 NOTE — Progress Notes (Signed)
Recreation Therapy Notes  Date: 07/19/16 Time: 1000 Location: 500 Hall Dayroom  Group Topic: Coping Skills  Goal Area(s) Addresses:  Patients will be able to identify positive coping skills. Patients will be able to identify benefits of using coping skills post d/c.  Intervention: Worksheet, pencils, dry erase marker, dry erase board  Activity: Mindmap.  Patients were given a worksheet of a a blank mind map.  As a group, patients filled in the first 8 boxes with triggers that require coping skills.  Patients were to then come up with 3 coping skills for each trigger identified by the group.  Once patients identified their coping skills, the group would fill in the remainder of the map on the board.  Education: Coping Skills, Discharge Planning.   Education Outcome: Acknowledges understanding/In group clarification offered/Needs additional education.   Clinical Observations/Feedback: Pt did not attend group.    Taressa Rauh, LRT/CTRS         Bria Sparr A 07/19/2016 12:43 PM 

## 2016-07-19 NOTE — Progress Notes (Signed)
[facility] MD Progress Note  07/19/2016 12:50 PM Philip Gonzalez  MRN:  [id number]   Subjective: Patient states " I am fine.'    Objective:Patient seen and chart reviewed.Discussed patient with treatment team.  Pt today seen in bed, has his eyes closed , is guarded. Pt continues to remain withdrawn often , has been making an attempt to attend some groups. Pt denies new concerns. Reports paranoia is improving. No disruptive issues noted as per RN.     Principal Problem: MDD (major depressive disorder), single episode, severe with psychosis (HCC) Diagnosis:   Patient Active Problem List   Diagnosis Date Noted  . MDD (major depressive disorder), single episode, severe with psychosis (HCC) [F32.3] 07/15/2016  . 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 Cannabis use disorder, severe, dependence (HCC) [F12.20] 07/15/2016  . Alcohol use disorder, moderate, dependence (HCC) [F10.20] 07/15/2016  . Tobacco use disorder [F17.200] 07/15/2016  . Benzodiazepine abuse [F13.10] 07/15/2016  . Chronic back pain [M54.9, G89.29] 07/15/2016  . Essential hypertension [I10] 07/15/2016  . PTSD (post-traumatic stress disorder) [F43.10] 07/14/2016   Total Time spent with patient: 20 minutes  Past Psychiatric History: Please see H&P.   Past Medical History:  Past Medical History:  Diagnosis Date  . Acid reflux   . Hemorrhoid   . Hypertension   . PTSD (post-traumatic stress disorder)   . STD (male)    History reviewed. No pertinent surgical history. Family History:  Family History  Problem Relation Age of Onset  . Cancer Mother   . Depression Mother   . Depression Maternal Aunt    Family Psychiatric  History: Please see H&P.  Social History: Please see H&P.  History  Alcohol Use  . Yes    Comment: occ     History  Drug Use No    Social History   Social History  . Marital status: Divorced    Spouse name: N/A  . Number of children: N/A  . Years of education: N/A   Social History Main  Topics  . Smoking status: Smoker, Current Status Unknown    Packs/day: 1.00    Years: 14.00    Types: Cigarettes    Last attempt to quit: 01/18/2016  . Smokeless tobacco: Never Used  . Alcohol use Yes     Comment: occ  . Drug use: No  . Sexual activity: Yes    Birth control/ protection: Condom   Other Topics Concern  . None   Social History Narrative  . None   Additional Social History:    Pain Medications: denies Prescriptions: see MAR Over the Counter: see MAR History of alcohol / drug use?: Yes Name of Substance 1: Cocaine 1 - Age of First Use: unknown 1 - Amount (size/oz): varies 1 - Frequency: daily 1 - Duration: years 1 - Last Use / Amount: yesterday Name of Substance 2: Molly's 2 - Age of First Use: unknown 2 - Amount (size/oz): varies 2 - Frequency: two times per week 2 - Duration: years 2 - Last Use / Amount: unknown Name of Substance 3: alcohol (wine) 3 - Age of First Use: unknown 3 - Amount (size/oz): 1 bottle 3 - Frequency: 2 times per week 3 - Duration: years 3 - Last Use / Amount: unknown              Sleep: Fair  Appetite:  Fair  Current Medications: Current Facility-Administered Medications  Medication Dose Route Frequency Provider Last Rate Last Dose  . acetaminophen (TYLENOL) tablet 650 mg  650 mg Oral Q6H PRN Laveda AbbeLaurie Britton Parks, NP   650 mg at 07/15/16 [id number]  . alum & mag hydroxide-simeth (MAALOX/MYLANTA) 200-200-20 MG/5ML suspension 30 mL  30 mL Oral Q4H PRN Laveda AbbeLaurie Britton Parks, NP      . benztropine (COGENTIN) tablet 0.5 mg  0.5 mg Oral QHS Jomarie LongsSaramma Priscilla Kirstein, MD   0.5 mg at 07/18/16 2146  . citalopram (CELEXA) tablet 20 mg  20 mg Oral Daily Oneta Rackanika N Lewis, NP   20 mg at 07/19/16 0748  . divalproex (DEPAKOTE) DR tablet 500 mg  500 mg Oral Q12H Philopateer Strine, MD   500 mg at 07/19/16 0748  . haloperidol (HALDOL) tablet 5 mg  5 mg Oral Q8H PRN Jomarie LongsSaramma Abdo Denault, MD       Or  . haloperidol lactate (HALDOL) injection 5 mg  5 mg Intramuscular  Q8H PRN Royer Cristobal, MD      . hydrochlorothiazide (MICROZIDE) capsule 12.5 mg  12.5 mg Oral Daily Rastus Borton, MD   12.5 mg at 07/19/16 0749  . magnesium hydroxide (MILK OF MAGNESIA) suspension 30 mL  30 mL Oral Daily PRN Laveda AbbeLaurie Britton Parks, NP      . multivitamin with minerals tablet 1 tablet  1 tablet Oral Daily Jomarie LongsSaramma Jersey Espinoza, MD   1 tablet at 07/19/16 0748  . nicotine (NICODERM CQ - dosed in mg/24 hours) patch 21 mg  21 mg Transdermal Daily Jomarie LongsSaramma Corben Auzenne, MD   21 mg at 07/19/16 0748  . risperiDONE (RISPERDAL) tablet 0.5 mg  0.5 mg Oral BID Jomarie LongsSaramma Zadrian Mccauley, MD   0.5 mg at 07/19/16 0748  . risperiDONE (RISPERDAL) tablet 1 mg  1 mg Oral QHS Jomarie LongsSaramma Irvin Bastin, MD   1 mg at 07/18/16 2146  . sodium chloride (OCEAN) 0.65 % nasal spray 1 spray  1 spray Each Nare PRN Jomarie LongsSaramma Wylma Tatem, MD   1 spray at 07/17/16 1315  . thiamine (VITAMIN B-1) tablet 100 mg  100 mg Oral Daily Jomarie LongsSaramma Chayse Zatarain, MD   100 mg at 07/19/16 0748  . traZODone (DESYREL) tablet 100 mg  100 mg Oral QHS PRN Jomarie LongsSaramma Laniah Grimm, MD   100 mg at 07/18/16 2146  . witch hazel-glycerin (TUCKS) pad   Topical PRN Oneta Rackanika N Lewis, NP        Lab Results:  Results for orders placed or performed during the hospital encounter of 07/14/16 (from the past 48 hour(s))  Valproic acid level     Status: Abnormal   Collection Time: 07/18/16  6:22 AM  Result Value Ref Range   Valproic Acid Lvl 47 (L) 50.0 - 100.0 ug/mL    Comment: Performed at [facility], [address] [place], [place] [id number]    Blood Alcohol level:  Lab Results  Component Value Date   [facility]   ETH <5 07/13/2016    Metabolic Disorder Labs: Lab Results  Component Value Date   HGBA1C 5.2 07/16/2016   MPG 103 07/16/2016   Lab Results  Component Value Date   PROLACTIN 24.0 (H) 07/16/2016   Lab Results  Component Value Date   CHOL 180 07/16/2016   TRIG 148 07/16/2016   HDL 71 07/16/2016   CHOLHDL 2.5 07/16/2016   VLDL 30 07/16/2016    LDLCALC 79 07/16/2016    Physical Findings: AIMS: Facial and Oral Movements Muscles of Facial Expression: None, normal Lips and Perioral Area: None, normal Jaw: None, normal Tongue: None, normal,Extremity Movements Upper (arms, wrists, hands, fingers): None, normal Lower (legs, knees, ankles, toes):  None, normal, Trunk Movements Neck, shoulders, hips: None, normal, Overall Severity Severity of abnormal movements (highest score from questions above): None, normal Incapacitation due to abnormal movements: None, normal Patient's awareness of abnormal movements (rate only patient's report): No Awareness, Dental Status Current problems with teeth and/or dentures?: No Does patient usually wear dentures?: No  CIWA:  CIWA-Ar Total: 0 COWS:     Musculoskeletal: Strength & Muscle Tone: within normal limits Gait & Station: normal Patient leans: N/A  Psychiatric Specialty Exam: Physical Exam  Nursing note and vitals reviewed.   Review of Systems  Psychiatric/Behavioral: The patient is nervous/anxious.   All other systems reviewed and are negative.   Blood pressure (!) 142/88, pulse (!) 107, temperature 98.1 F (36.7 C), temperature source Oral, resp. rate 18, height 5\' 7"  (1.702 m), weight 81.6 kg (180 lb), SpO2 99 %.Body mass index is 28.19 kg/m.  General Appearance: Guarded  Eye Contact:  None  Speech:  Slow  Volume:  Normal  Mood:  Anxious and Depressed  Affect:  Congruent  Thought Process:  Goal Directed and Descriptions of Associations: Circumstantial  Orientation:  Full (Time, Place, and Person)  Thought Content:  Hallucinations: Auditory and Paranoid Ideation IMPROVING  Suicidal Thoughts:  No  Homicidal Thoughts:  No  Memory:  Immediate;   Fair Recent;   Fair Remote;   Fair  Judgement:  Fair  Insight:  Fair  Psychomotor Activity:  Decreased  Concentration:  Concentration: Fair and Attention Span: Fair  Recall:  [employer] of Knowledge:  Fair  Language:  Fair   Akathisia:  No  Handed:  Right  AIMS (if indicated):     Assets:  Desire for Improvement Social Support  ADL's:  Intact  Cognition:  WNL  Sleep:  Number of Hours: 6.75   Will continue today 07/19/16 plan as below except where it is noted. MDD (major depressive disorder), single episode, severe with psychosis (HCC) unstable - improving  Treatment Plan Summary:Patient seen in bed, is guarded , withdrawn , reports paranoia as improving , continues to need a lot of support , encourage to attend groups and participate in milieu. Daily contact with patient to assess and evaluate symptoms and progress in treatment and Medication management     For psychosis:reviewed today - continue risperidone as noted below . Risperidone 1 mg po qhs and 0.5 mg po bid. Cogentin 0.5 mg po daily for EPS.  For insomnia:reviewed - continue trazodone. Continue Trazodone 100 mg po qhs prn.  For PTSD:reviewed - continue celexa. Celexa 20 mg po daily. [facility]  For ZOX:WRUEAVWU same dose. Celexa to 20 mg po daily for depression. Depakote 500 mg po bid - depakote level on 07/18/16- subtherapeutic - will get another level on 07/22/16- dose increased yesterday. Risperidone to augment celexa as well as for psychosis.  For substance abuse- MDMA, Alcohol, bzd, cannabis , ? Cocaine - reviewed - as below . Provided substance abuse counseling when he is more alert. Will refer to substance abuse treatment program. CIWA/Ativan protocol for alcohol/bzd withdrawal sx.  For tobacco use do: Pt decline nicotine patch.  For seizure disorder: Will increase Depakote to 500 mg po bid - depakote level -subtherapeutic- 07/18/16- get another level in 5 days.  Depakote level in 5 days.  For essential HTN: Restart HCTZ 12.5 mg po daily.Pt was noncompliant /as per past records in EHR was on HCTZ.  Will continue to monitor vitals ,medication compliance and treatment side effects while  patient  is here.   Will monitor for medical issues as well as call consult as needed.   Reviewed labs cbc - hb, hct - low , likely chronic anemia , cmp - shows AST - elevated - likely due to substance abuse , UDS - Pos for cocaine, bzd, will order tsh, lipid panel, hba1c, pl.  EKG reviewed - qtc wnl.  CSW will continue working on disposition.  Patient to participate in therapeutic milieu .   Abhimanyu Cruces, MD 07/19/2016, 12:50 PM

## 2016-07-19 NOTE — Progress Notes (Signed)
Patient denies SI, HI and AVH.  Patient denies depressive symptoms but endorses low levels of anxiety.   Patient has attended groups and have been compliant with medications.  Patient has been withdrawn and isolative.    Assess patient for safety, offer medications as prescribed, engage patient in 1:1 staff talks.    Continue to monitor as prescribed.  Patient able to contract for safety.

## 2016-07-19 NOTE — [facility] Group Notes (Signed)
[facility] LCSW Group Therapy  07/19/2016 2:58 PM   Type of Therapy:  Group Therapy  Participation Level:  Active  Participation Quality:  Attentive  Affect:  Appropriate  Cognitive:  Appropriate  Insight:  Improving  Engagement in Therapy:  Engaged  Modes of Intervention:  Clarification, Education, Exploration and Socialization  Summary of Progress/Problems: Today's group focused on relapse prevention.  We defined the term, and then brainstormed on ways to prevent relapse. Invited.  Declined to attend.  Daryel Geraldorth, Jakub Debold B 07/19/2016 , 2:58 PM

## 2016-07-19 NOTE — Progress Notes (Signed)
Philip Gonzalez. Philip Gonzalez had been up and visible in milieu this evening, attended and participated in evening group activity. Philip Gonzalez spoke about how he is beginning to feel better than when he came in and spoke about how he feels the medications are beneficial for him. Philip Gonzalez received all bedtime medications without incident and did not verbalize any complaints of pain. A. Support and encouragement provided. R. Safety maintained, will continue to monitor.

## 2016-07-20 MED ORDER — TRAZODONE HCL 100 MG PO TABS
100.0000 mg | ORAL_TABLET | Freq: Every day | ORAL | Status: DC
  Administered 2016-07-20 – 2016-07-21 (×2): 100 mg via ORAL
  Filled 2016-07-20 (×3): qty 1
  Filled 2016-07-20: qty 7

## 2016-07-20 NOTE — Progress Notes (Signed)
[facility] MD Progress Note  07/20/2016 1:35 PM Philip Gonzalez  MRN:  [id number]   Subjective: Patient states " I have a lot going on. My mother passed away , I was the Health care power of attorney , after her death I started getting sick , I lost my job and last week my dad passed away.'     Objective:Patient seen and chart reviewed.Discussed patient with treatment team.  Pt today seen as more visible in milieu. Pt continues to have sadness, paranoia - but they are improving. He continues to be ruminative of his several stressors , but he continues to work on his coping skills. Tolerating medications well, denies ADRs. Continue to encourage and support.    Principal Problem: MDD (major depressive disorder), single episode, severe with psychosis (HCC) Diagnosis:   Patient Active Problem List   Diagnosis Date Noted  . MDD (major depressive disorder), single episode, severe with psychosis (HCC) [F32.3] 07/15/2016  . 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 Cannabis use disorder, severe, dependence (HCC) [F12.20] 07/15/2016  . Alcohol use disorder, moderate, dependence (HCC) [F10.20] 07/15/2016  . Tobacco use disorder [F17.200] 07/15/2016  . Benzodiazepine abuse [F13.10] 07/15/2016  . Chronic back pain [M54.9, G89.29] 07/15/2016  . Essential hypertension [I10] 07/15/2016  . PTSD (post-traumatic stress disorder) [F43.10] 07/14/2016   Total Time spent with patient: 20 minutes  Past Psychiatric History: Please see H&P.   Past Medical History:  Past Medical History:  Diagnosis Date  . Acid reflux   . Hemorrhoid   . Hypertension   . PTSD (post-traumatic stress disorder)   . STD (male)    History reviewed. No pertinent surgical history. Family History:  Family History  Problem Relation Age of Onset  . Cancer Mother   . Depression Mother   . Depression Maternal Aunt    Family Psychiatric  History: Please see H&P.  Social History: Please see H&P.  History  Alcohol Use  . Yes     Comment: occ     History  Drug Use No    Social History   Social History  . Marital status: Divorced    Spouse name: N/A  . Number of children: N/A  . Years of education: N/A   Social History Main Topics  . Smoking status: Smoker, Current Status Unknown    Packs/day: 1.00    Years: 14.00    Types: Cigarettes    Last attempt to quit: 01/18/2016  . Smokeless tobacco: Never Used  . Alcohol use Yes     Comment: occ  . Drug use: No  . Sexual activity: Yes    Birth control/ protection: Condom   Other Topics Concern  . None   Social History Narrative  . None   Additional Social History:    Pain Medications: denies Prescriptions: see MAR Over the Counter: see MAR History of alcohol / drug use?: Yes Name of Substance 1: Cocaine 1 - Age of First Use: unknown 1 - Amount (size/oz): varies 1 - Frequency: daily 1 - Duration: years 1 - Last Use / Amount: yesterday Name of Substance 2: Molly's 2 - Age of First Use: unknown 2 - Amount (size/oz): varies 2 - Frequency: two times per week 2 - Duration: years 2 - Last Use / Amount: unknown Name of Substance 3: alcohol (wine) 3 - Age of First Use: unknown 3 - Amount (size/oz): 1 bottle 3 - Frequency: 2 times per week 3 - Duration: years 3 - Last Use / Amount: unknown  Sleep: Fair  Appetite:  Fair  Current Medications: Current Facility-Administered Medications  Medication Dose Route Frequency Provider Last Rate Last Dose  . acetaminophen (TYLENOL) tablet 650 mg  650 mg Oral Q6H PRN Laveda Abbe, NP   650 mg at 07/15/16 1610  . alum & mag hydroxide-simeth (MAALOX/MYLANTA) 200-200-20 MG/5ML suspension 30 mL  30 mL Oral Q4H PRN Laveda Abbe, NP      . benztropine (COGENTIN) tablet 0.5 mg  0.5 mg Oral QHS Jomarie Longs, MD   0.5 mg at 07/19/16 2117  . citalopram (CELEXA) tablet 20 mg  20 mg Oral Daily Oneta Rack, NP   20 mg at 07/20/16 9604  . divalproex (DEPAKOTE) DR tablet 500 mg  500  mg Oral Q12H Joei Frangos, MD   500 mg at 07/20/16 5409  . haloperidol (HALDOL) tablet 5 mg  5 mg Oral Q8H PRN Jomarie Longs, MD       Or  . haloperidol lactate (HALDOL) injection 5 mg  5 mg Intramuscular Q8H PRN Adelyna Brockman, MD      . hydrochlorothiazide (MICROZIDE) capsule 12.5 mg  12.5 mg Oral Daily Tennessee Hanlon, MD   12.5 mg at 07/20/16 0823  . magnesium hydroxide (MILK OF MAGNESIA) suspension 30 mL  30 mL Oral Daily PRN Laveda Abbe, NP      . multivitamin with minerals tablet 1 tablet  1 tablet Oral Daily Jomarie Longs, MD   1 tablet at 07/20/16 0823  . nicotine (NICODERM CQ - dosed in mg/24 hours) patch 21 mg  21 mg Transdermal Daily Jomarie Longs, MD   21 mg at 07/20/16 0824  . risperiDONE (RISPERDAL) tablet 0.5 mg  0.5 mg Oral BID Jomarie Longs, MD   0.5 mg at 07/20/16 0823  . risperiDONE (RISPERDAL) tablet 1 mg  1 mg Oral QHS Jomarie Longs, MD   1 mg at 07/19/16 2116  . sodium chloride (OCEAN) 0.65 % nasal spray 1 spray  1 spray Each Nare PRN Jomarie Longs, MD   1 spray at 07/17/16 1315  . thiamine (VITAMIN B-1) tablet 100 mg  100 mg Oral Daily Jomarie Longs, MD   100 mg at 07/20/16 0823  . traZODone (DESYREL) tablet 100 mg  100 mg Oral QHS PRN Jomarie Longs, MD   100 mg at 07/19/16 2116  . witch hazel-glycerin (TUCKS) pad   Topical PRN Oneta Rack, NP        Lab Results:  No results found for this or any previous visit (from the past 48 hour(s)).  Blood Alcohol level:  Lab Results  Component Value Date   ETH <5 07/14/2016   ETH <5 07/13/2016    Metabolic Disorder Labs: Lab Results  Component Value Date   HGBA1C 5.2 07/16/2016   MPG 103 07/16/2016   Lab Results  Component Value Date   PROLACTIN 24.0 (H) 07/16/2016   Lab Results  Component Value Date   CHOL 180 07/16/2016   TRIG 148 07/16/2016   HDL 71 07/16/2016   CHOLHDL 2.5 07/16/2016   VLDL 30 07/16/2016   LDLCALC 79 07/16/2016    Physical Findings: AIMS: Facial and Oral  Movements Muscles of Facial Expression: None, normal Lips and Perioral Area: None, normal Jaw: None, normal Tongue: None, normal,Extremity Movements Upper (arms, wrists, hands, fingers): None, normal Lower (legs, knees, ankles, toes): None, normal, Trunk Movements Neck, shoulders, hips: None, normal, Overall Severity Severity of abnormal movements (highest score from questions above): None, normal Incapacitation due to abnormal  movements: None, normal Patient's awareness of abnormal movements (rate only patient's report): No Awareness, Dental Status Current problems with teeth and/or dentures?: No Does patient usually wear dentures?: No  CIWA:  CIWA-Ar Total: 0 COWS:     Musculoskeletal: Strength & Muscle Tone: within normal limits Gait & Station: normal Patient leans: N/A  Psychiatric Specialty Exam: Physical Exam  Nursing note and vitals reviewed.   Review of Systems  Psychiatric/Behavioral: The patient is nervous/anxious.   All other systems reviewed and are negative.   Blood pressure 130/86, pulse 91, temperature 98 F (36.7 C), resp. rate 16, height 5\' 7"  (1.702 m), weight 81.6 kg (180 lb), SpO2 99 %.Body mass index is 28.19 kg/m.  General Appearance: Guarded  Eye Contact:  Fair  Speech:  Normal Rate  Volume:  Normal  Mood:  Anxious, Depressed and improving  Affect:  Appropriate  Thought Process:  Linear and Descriptions of Associations: Circumstantial  Orientation:  Full (Time, Place, and Person)  Thought Content:  Paranoid Ideation on and off   Suicidal Thoughts:  No  Homicidal Thoughts:  No  Memory:  Immediate;   Fair Recent;   Fair Remote;   Fair  Judgement:  Fair  Insight:  Fair  Psychomotor Activity:  Normal  Concentration:  Concentration: Fair and Attention Span: Fair  Recall:  [employer] of Knowledge:  Fair  Language:  Fair  Akathisia:  No  Handed:  Right  AIMS (if indicated):     Assets:  Desire for Improvement Social Support  ADL's:  Intact   Cognition:  WNL  Sleep:  Number of Hours: 5.75    MDD (major depressive disorder), single episode, severe with psychosis (HCC) improving  Will continue today 07/20/16  plan as below except where it is noted.     Treatment Plan Summary:Patient today seen as less isolative , more visible in milieu , continues to be paranoid - but is making progress, denies any ADRs to medications , continue to treat.  Daily contact with patient to assess and evaluate symptoms and progress in treatment and Medication management     For psychosis: Risperidone 0.5 mg po bid and 1 mg po qhs to be continued - pt is making progress. Cogentin 0.5 mg po daily for EPS.  For insomnia: Continue Trazodone 100 mg po qhs prn.  For PTSD:reviewed - continue celexa. Celexa 20 mg po daily. [facility]  For ZOX:WRUEAVWU - continue same as below. Celexa to 20 mg po daily for depression. Will continue Depakote 500 mg po bid - next depakote level on 07/22/16 . Depakote also augments celexa and also is for seizure do. Risperidone to augment celexa as well as for psychosis.  For substance abuse- MDMA, Alcohol, bzd, cannabis , ? Cocaine -reviewed as below  Provided substance abuse counseling when he is more alert. Will refer to substance abuse treatment program. CIWA/Ativan protocol for alcohol/bzd withdrawal sx.  For tobacco use do: Pt decline nicotine patch.  For seizure disorder: Will increase Depakote to 500 mg po bid - depakote level -subtherapeutic- 07/18/16- get another level - 07/22/16.   For essential HTN: Restart HCTZ 12.5 mg po daily.Pt was noncompliant /as per past records in EHR was on HCTZ.  Will continue to monitor vitals ,medication compliance and treatment side effects while patient is here.   Will monitor for medical issues as well as call consult as needed.   Reviewed labs cbc - hb, hct - low , likely chronic anemia ,  cmp - shows AST - elevated -  likely due to substance abuse , UDS - Pos for cocaine, bzd. Reviewed TSH - wnl, lipid panel - wnl , hba1c- wnl, pl - slightly elevated - will need to be monitored.  EKG reviewed - qtc wnl.  CSW will continue working on disposition.  Patient to participate in therapeutic milieu .   Srihith Aquilino, MD 07/20/2016, 1:35 PM

## 2016-07-20 NOTE — Progress Notes (Signed)
Recreation Therapy Notes  Date: 07/20/16 Time: 1000 Location: [address] Dayroom  Group Topic: Self-Esteem  Goal Area(s) Addresses:  Patient will identify positive ways to increase self-esteem. Patient will verbalize benefit of increased self-esteem.  Behavioral Response: Engaged  Intervention: Worksheet, colored pencils  Activity: Crest.  Patients were given a blank crest.  The crest was divided into four sections.  In each section, patients were to highlight something positive about themselves.  Patients could choose from topics such as proudest moment, best quality, biggest achievement, favorite activity, best feature or they could come up with different topics on their own.  Education:  Self-Esteem, Building control surveyorDischarge Planning.   Education Outcome: Acknowledges education/In group clarification offered/Needs additional education  Clinical Observations/Feedback: Pt worked well on the activity.  Pt left early with doctor and did not return.   Caroll RancherMarjette Keoshia Steinmetz, LRT/CTRS     Caroll RancherLindsay, Sigrid Schwebach A 07/20/2016 11:46 AM

## 2016-07-20 NOTE — Progress Notes (Signed)
Adult Psychoeducational Group Note  Date:  07/20/2016 Time:  12:05 AM  Group Topic/Focus:  Wrap-Up Group:   The focus of this group is to help patients review their daily goal of treatment and discuss progress on daily workbooks.  Participation Level:  Active  Participation Quality:  Appropriate  Affect:  Appropriate  Cognitive:  Alert  Insight: Appropriate  Engagement in Group:  Engaged  Modes of Intervention:  Discussion  Additional Comments:  Patient states, "I had a decent day". Patient's goal for today was to be patient.   Jamyrah Saur L Omnia Dollinger 07/20/2016, 12:05 AM

## 2016-07-20 NOTE — [facility] Group Notes (Cosign Needed)
[facility] Group Notes:  (Counselor/Nursing/MHT/Case Management/Adjunct)  07/20/2016 1:15PM  Type of Therapy:  Group Therapy  Participation Level:  Active  Participation Quality:  Appropriate  Affect:  Flat  Cognitive:  Oriented  Insight:  Improving  Engagement in Group:  Limited  Engagement in Therapy:  Limited  Modes of Intervention:  Discussion, Exploration and Socialization  Summary of Progress/Problems: The topic for group was balance in life.  Pt participated in the discussion about when their life was in balance and out of balance and how this feels.  Pt discussed ways to get back in balance and short term goals they can work on to get where they want to be.  "I think I am balanced today.  Things are going pretty well."  Went on to say that eating a balanced diet is something that contributes to this.  Stated he knows it will be important to focus on his mental wellness going forward, and is concerned about his living situation since his grandfather is 49 YO, but also does not appear to be invested in getting into the [facility] for mental health help.  States he will go [facility] in [place].  Daryel Geraldorth, Tennille Montelongo B 07/20/2016 1:10 PM

## 2016-07-20 NOTE — Progress Notes (Signed)
Nursing Note 07/20/2016 [id number]  Data Reports sleeping "fair" with PRN sleep med on inventory, but explained he didn't sleep the best to this nurse.  Rates depression 0/10, hopelessness 0/10, and anxiety 0/10. Affect blunted.  Patient appeared tired this AM,   denies HI, SI, AVH.  Patient upset after talking to MD "I was supposed to leave Thursday!" nurse allowed patient to vent, then reinforced that we must wait on the depakote level to be drawn on Friday.  Patient calmed down shortly after.  Attended groups today, interacting with peers.  Action Spoke with patient 1:1, nurse offered support to patient throughout shift.  Continues to be monitored on 15 minute checks for safety.  Response Remains safe on unit, attending groups.

## 2016-07-20 NOTE — Progress Notes (Signed)
Philip Gonzalez. Philip Gonzalez had been up and visible in milieu this evening, attended and participated in group activity. Philip Gonzalez spoke briefly about how he is feeling better, limited interaction with peers in milieu, did receive all bedtime medications without incident and did not verbalize any complaints of pain. A. Support provided R. Safety maintained, will continue to monitor.

## 2016-07-21 NOTE — Progress Notes (Signed)
  DATA ACTION RESPONSE  Objective- Pt. is up and visible in the milieu, watching TV and interacting with a peer. Pt. presents with an anxious affect and mood. Pt. was minimal and guarded with interaction. Subjective- Denies having any SI/HI/AVH/Pain at this time. Pt. states " I am ready to go home; just worried about who will pick me up". Pt. continues to be cooperative and remain safe & pleasant on the unit.  1:1 interaction in private to establish rapport. Encouragement, education, & support given from staff. Meds. ordered and administered.   Safety maintained with Q 15 checks. Continues to follow treatment plan and will monitor closely. No additonal questions/concerns noted.

## 2016-07-21 NOTE — Progress Notes (Signed)
DAR NOTE: Patient presents with calm affect and pleasant mood.  Denies pain, auditory and visual hallucinations.  Described energy level as normal and concentration as good.  Rates depression at 0, hopelessness at 0, and anxiety at 0.  Maintained on routine safety checks.  Medications given as prescribed.  Support and encouragement offered as needed.  Attended group and participated.  States goal for today is "going forward."  Patient observed socializing with peers in the dayroom.  Offered no complaint.

## 2016-07-21 NOTE — Plan of Care (Signed)
Problem: Safety: Goal: Periods of time without injury will increase Outcome: Progressing Pt. remains a low fall risk, denies SI/HI/AVH at this time, Q 15 checks in effect.    

## 2016-07-21 NOTE — [facility] Group Notes (Signed)
Type of Therapy:  Group Therapy   Participation Level:  Engaged  Participation Quality:  Attentive  Affect:  Appropriate   Cognitive:  Alert   Insight:  Engaged  Engagement in Therapy:  Improving   Mode s of Intervention:  Education, Exploration, Socialization   Summary of Progress/Problems: Philip Gonzalez was engaged throughout, and stayed entire time.   Tammi from [employer] was here to tell her story of recovery and inform patients about [facility] and their services.

## 2016-07-21 NOTE — Progress Notes (Signed)
Adult Psychoeducational Group Note  Date:  07/21/2016 Time:  10:08 PM  Group Topic/Focus:  Wrap-Up Group:   The focus of this group is to help patients review their daily goal of treatment and discuss progress on daily workbooks.  Participation Level:  Active  Participation Quality:  Appropriate  Affect:  Appropriate  Cognitive:  Alert  Insight: Appropriate  Engagement in Group:  Engaged  Modes of Intervention:  Discussion  Additional Comments:  Patient states, "I had a good day". Patient also states he is learning to let God lead him and to not worry about things he can't control. Patient states he will be discharging tomorrow.   Flem Enderle L Chloris Marcoux 07/21/2016, 10:08 PM

## 2016-07-21 NOTE — Progress Notes (Signed)
Philip Gonzalez. [name] had been up and visible in milieu this evening, seen interacting appropriately with peers. Jari FavreOscar spoke about how he did not sleep well, tossing and turning, denied SI and spoke about how he is suppose to be discharged on Friday and received all bedtime medications without incident. A. Support and encouragement provided. R. Safety maintained, will continue to monitor.

## 2016-07-21 NOTE — Progress Notes (Signed)
[facility] MD Progress Note  07/21/2016 2:45 PM Philip Gonzalez  MRN:  [id number]   Subjective: Patient states " I am OK, I do not feel good with my room mate though ."   Objective:Patient seen and chart reviewed.Discussed patient with treatment team.  Pt today seen as less depressed, more alert, more visible in milieu. Pt is tolerating medications well, denies ADRs. Pt today with concerns about his room mate , addressed the same , RN aware. Continue to support.    Principal Problem: MDD (major depressive disorder), single episode, severe with psychosis (HCC) Diagnosis:   Patient Active Problem List   Diagnosis Date Noted  . MDD (major depressive disorder), single episode, severe with psychosis (HCC) [F32.3] 07/15/2016  . 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 Cannabis use disorder, severe, dependence (HCC) [F12.20] 07/15/2016  . Alcohol use disorder, moderate, dependence (HCC) [F10.20] 07/15/2016  . Tobacco use disorder [F17.200] 07/15/2016  . Benzodiazepine abuse [F13.10] 07/15/2016  . Chronic back pain [M54.9, G89.29] 07/15/2016  . Essential hypertension [I10] 07/15/2016  . PTSD (post-traumatic stress disorder) [F43.10] 07/14/2016   Total Time spent with patient: 20 minutes  Past Psychiatric History: Please see H&P.   Past Medical History:  Past Medical History:  Diagnosis Date  . Acid reflux   . Hemorrhoid   . Hypertension   . PTSD (post-traumatic stress disorder)   . STD (male)    History reviewed. No pertinent surgical history. Family History:  Family History  Problem Relation Age of Onset  . Cancer Mother   . Depression Mother   . Depression Maternal Aunt    Family Psychiatric  History: Please see H&P.  Social History: Please see H&P.  History  Alcohol Use  . Yes    Comment: occ     History  Drug Use No    Social History   Social History  . Marital status: Divorced    Spouse name: N/A  . Number of children: N/A  . Years of education: N/A   Social  History Main Topics  . Smoking status: Smoker, Current Status Unknown    Packs/day: 1.00    Years: 14.00    Types: Cigarettes    Last attempt to quit: 01/18/2016  . Smokeless tobacco: Never Used  . Alcohol use Yes     Comment: occ  . Drug use: No  . Sexual activity: Yes    Birth control/ protection: Condom   Other Topics Concern  . None   Social History Narrative  . None   Additional Social History:    Pain Medications: denies Prescriptions: see MAR Over the Counter: see MAR History of alcohol / drug use?: Yes Name of Substance 1: Cocaine 1 - Age of First Use: unknown 1 - Amount (size/oz): varies 1 - Frequency: daily 1 - Duration: years 1 - Last Use / Amount: yesterday Name of Substance 2: Molly's 2 - Age of First Use: unknown 2 - Amount (size/oz): varies 2 - Frequency: two times per week 2 - Duration: years 2 - Last Use / Amount: unknown Name of Substance 3: alcohol (wine) 3 - Age of First Use: unknown 3 - Amount (size/oz): 1 bottle 3 - Frequency: 2 times per week 3 - Duration: years 3 - Last Use / Amount: unknown              Sleep: Fair  Appetite:  Fair  Current Medications: Current Facility-Administered Medications  Medication Dose Route Frequency Provider Last Rate Last Dose  . acetaminophen (TYLENOL)  tablet 650 mg  650 mg Oral Q6H PRN Laveda Abbe, NP   650 mg at 07/15/16 4098  . alum & mag hydroxide-simeth (MAALOX/MYLANTA) 200-200-20 MG/5ML suspension 30 mL  30 mL Oral Q4H PRN Laveda Abbe, NP      . benztropine (COGENTIN) tablet 0.5 mg  0.5 mg Oral QHS Jomarie Longs, MD   0.5 mg at 07/20/16 2158  . citalopram (CELEXA) tablet 20 mg  20 mg Oral Daily Oneta Rack, NP   20 mg at 07/21/16 0809  . divalproex (DEPAKOTE) DR tablet 500 mg  500 mg Oral Q12H Milly Goggins, MD   500 mg at 07/21/16 0809  . haloperidol (HALDOL) tablet 5 mg  5 mg Oral Q8H PRN Jomarie Longs, MD       Or  . haloperidol lactate (HALDOL) injection 5 mg  5 mg  Intramuscular Q8H PRN Vitaly Wanat, MD      . hydrochlorothiazide (MICROZIDE) capsule 12.5 mg  12.5 mg Oral Daily Jonathyn Carothers, MD   12.5 mg at 07/21/16 0809  . magnesium hydroxide (MILK OF MAGNESIA) suspension 30 mL  30 mL Oral Daily PRN Laveda Abbe, NP      . multivitamin with minerals tablet 1 tablet  1 tablet Oral Daily Jomarie Longs, MD   1 tablet at 07/21/16 0809  . nicotine (NICODERM CQ - dosed in mg/24 hours) patch 21 mg  21 mg Transdermal Daily Jomarie Longs, MD   21 mg at 07/21/16 0810  . risperiDONE (RISPERDAL) tablet 0.5 mg  0.5 mg Oral BID Jomarie Longs, MD   0.5 mg at 07/21/16 0809  . risperiDONE (RISPERDAL) tablet 1 mg  1 mg Oral QHS Jomarie Longs, MD   1 mg at 07/20/16 2157  . sodium chloride (OCEAN) 0.65 % nasal spray 1 spray  1 spray Each Nare PRN Jomarie Longs, MD   1 spray at 07/20/16 1648  . thiamine (VITAMIN B-1) tablet 100 mg  100 mg Oral Daily Jomarie Longs, MD   100 mg at 07/21/16 0809  . traZODone (DESYREL) tablet 100 mg  100 mg Oral QHS Jomarie Longs, MD   100 mg at 07/20/16 2159  . witch hazel-glycerin (TUCKS) pad   Topical PRN Oneta Rack, NP        Lab Results:  No results found for this or any previous visit (from the past 48 hour(s)).  Blood Alcohol level:  Lab Results  Component Value Date   ETH <5 07/14/2016   ETH <5 07/13/2016    Metabolic Disorder Labs: Lab Results  Component Value Date   HGBA1C 5.2 07/16/2016   MPG 103 07/16/2016   Lab Results  Component Value Date   PROLACTIN 24.0 (H) 07/16/2016   Lab Results  Component Value Date   CHOL 180 07/16/2016   TRIG 148 07/16/2016   HDL 71 07/16/2016   CHOLHDL 2.5 07/16/2016   VLDL 30 07/16/2016   LDLCALC 79 07/16/2016    Physical Findings: AIMS: Facial and Oral Movements Muscles of Facial Expression: None, normal Lips and Perioral Area: None, normal Jaw: None, normal Tongue: None, normal,Extremity Movements Upper (arms, wrists, hands, fingers): None, normal Lower  (legs, knees, ankles, toes): None, normal, Trunk Movements Neck, shoulders, hips: None, normal, Overall Severity Severity of abnormal movements (highest score from questions above): None, normal Incapacitation due to abnormal movements: None, normal Patient's awareness of abnormal movements (rate only patient's report): No Awareness, Dental Status Current problems with teeth and/or dentures?: No Does patient usually wear  dentures?: No  CIWA:  CIWA-Ar Total: 0 COWS:     Musculoskeletal: Strength & Muscle Tone: within normal limits Gait & Station: normal Patient leans: N/A  Psychiatric Specialty Exam: Physical Exam  Nursing note and vitals reviewed.   Review of Systems  Psychiatric/Behavioral: The patient is nervous/anxious.   All other systems reviewed and are negative.   Blood pressure 113/83, pulse 85, temperature 97.8 F (36.6 C), temperature source Oral, resp. rate 16, height 5\' 7"  (1.702 m), weight 81.6 kg (180 lb), SpO2 99 %.Body mass index is 28.19 kg/m.  General Appearance: Casual  Eye Contact:  Fair  Speech:  Normal Rate  Volume:  Normal  Mood:  Anxious and Depressed improving  Affect:  Appropriate  Thought Process:  Linear and Descriptions of Associations: Circumstantial  Orientation:  Full (Time, Place, and Person)  Thought Content:  Rumination improving  Suicidal Thoughts:  No  Homicidal Thoughts:  No  Memory:  Immediate;   Fair Recent;   Fair Remote;   Fair  Judgement:  Fair  Insight:  Fair  Psychomotor Activity:  Normal  Concentration:  Concentration: Fair and Attention Span: Fair  Recall:  [employer] of Knowledge:  Fair  Language:  Fair  Akathisia:  No  Handed:  Right  AIMS (if indicated):     Assets:  Desire for Improvement Social Support  ADL's:  Intact  Cognition:  WNL  Sleep:  Number of Hours: 6.25    MDD (major depressive disorder), single episode, severe with psychosis (HCC) improving  Will continue today 07/21/16  plan as below except  where it is noted.    Treatment Plan Summary:Patient today seen as more visible , his anxiety, paranoia improving , concerned about his room mate - his concerns were addressed , continue to encourage and support.   Daily contact with patient to assess and evaluate symptoms and progress in treatment and Medication management     For psychosis:reviewed Risperidone 0.5 mg po bid and 1 mg po qhs to be continued - pt is making progress. Cogentin 0.5 mg po daily for EPS.  For insomnia:reviewed as below Continue Trazodone 100 mg po qhs prn.  For PTSD:reviewed as below Celexa 20 mg po daily. [facility]  For ZOX:[id number] -continue same . Celexa to 20 mg po daily for depression. Will continue Depakote 500 mg po bid - Depakote level on 07/22/16. Depakote also augments celexa and also is for seizure do. Risperidone to augment celexa as well as for psychosis.  For substance abuse- MDMA, Alcohol, bzd, cannabis , ? Cocaine -reviewed as below  Provided substance abuse counseling when he is more alert. Will refer to substance abuse treatment program. CIWA/Ativan protocol for alcohol/bzd withdrawal sx.  For tobacco use do: Pt decline nicotine patch.  For seizure disorder: Will increase Depakote to 500 mg po bid - depakote level -subtherapeutic- 07/18/16- get another level - 07/22/16.   For essential HTN: Restart HCTZ 12.5 mg po daily.Pt was noncompliant /as per past records in EHR was on HCTZ.  Will continue to monitor vitals ,medication compliance and treatment side effects while patient is here.   Will monitor for medical issues as well as call consult as needed.   Reviewed labs cbc - hb, hct - low , likely chronic anemia , cmp - shows AST - elevated - likely due to substance abuse , UDS - Pos for cocaine, bzd. Reviewed TSH - wnl, lipid panel - wnl , hba1c- wnl, pl - slightly  elevated - will need to be monitored.  EKG reviewed - qtc  wnl.  CSW will continue working on disposition.  Patient to participate in therapeutic milieu .   Chloee Tena, MD 07/21/2016, 2:45 PM

## 2016-07-21 NOTE — Tx Team (Signed)
Interdisciplinary Treatment and Diagnostic Plan Update  07/21/2016 Time of Session: 11:19 AM  Philip Gonzalez MRN: [id number]  Principal Diagnosis: MDD (major depressive disorder), single episode, severe with psychosis ([place])  Secondary Diagnoses: Principal Problem:   MDD (major depressive disorder), single episode, severe with psychosis ([place]) Active Problems:   PTSD (post-traumatic stress disorder)   MDMA abuse   Cannabis use disorder, severe, dependence ([place])   Alcohol use disorder, moderate, dependence ([place])   Tobacco use disorder   Benzodiazepine abuse   Chronic back pain   Essential hypertension   Current Medications:  Current Facility-Administered Medications  Medication Dose Route Frequency Provider Last Rate Last Dose  . acetaminophen (TYLENOL) tablet 650 mg  650 mg Oral Q6H PRN Ethelene Hal, NP   650 mg at 07/15/16 2831  . alum & mag hydroxide-simeth (MAALOX/MYLANTA) 200-200-20 MG/5ML suspension 30 mL  30 mL Oral Q4H PRN Ethelene Hal, NP      . benztropine (COGENTIN) tablet 0.5 mg  0.5 mg Oral QHS Ursula Alert, MD   0.5 mg at 07/20/16 2158  . citalopram (CELEXA) tablet 20 mg  20 mg Oral Daily Derrill Center, NP   20 mg at 07/21/16 0809  . divalproex (DEPAKOTE) DR tablet 500 mg  500 mg Oral Q12H Saramma Eappen, MD   500 mg at 07/21/16 0809  . haloperidol (HALDOL) tablet 5 mg  5 mg Oral Q8H PRN Ursula Alert, MD       Or  . haloperidol lactate (HALDOL) injection 5 mg  5 mg Intramuscular Q8H PRN Saramma Eappen, MD      . hydrochlorothiazide (MICROZIDE) capsule 12.5 mg  12.5 mg Oral Daily Saramma Eappen, MD   12.5 mg at 07/21/16 0809  . magnesium hydroxide (MILK OF MAGNESIA) suspension 30 mL  30 mL Oral Daily PRN Ethelene Hal, NP      . multivitamin with minerals tablet 1 tablet  1 tablet Oral Daily Ursula Alert, MD   1 tablet at 07/21/16 0809  . nicotine (NICODERM CQ - dosed in mg/24 hours) patch 21 mg  21 mg Transdermal Daily Ursula Alert, MD    21 mg at 07/21/16 0810  . risperiDONE (RISPERDAL) tablet 0.5 mg  0.5 mg Oral BID Ursula Alert, MD   0.5 mg at 07/21/16 0809  . risperiDONE (RISPERDAL) tablet 1 mg  1 mg Oral QHS Ursula Alert, MD   1 mg at 07/20/16 2157  . sodium chloride (OCEAN) 0.65 % nasal spray 1 spray  1 spray Each Nare PRN Ursula Alert, MD   1 spray at 07/20/16 1648  . thiamine (VITAMIN B-1) tablet 100 mg  100 mg Oral Daily Ursula Alert, MD   100 mg at 07/21/16 0809  . traZODone (DESYREL) tablet 100 mg  100 mg Oral QHS Ursula Alert, MD   100 mg at 07/20/16 2159  . witch hazel-glycerin (TUCKS) pad   Topical PRN Derrill Center, NP        PTA Medications: Prescriptions Prior to Admission  Medication Sig Dispense Refill Last Dose  . aspirin 325 MG tablet Take 650 mg by mouth daily.    07/11/2016 at Unknown time  . hydrochlorothiazide (HYDRODIURIL) 25 MG tablet Take 25 mg by mouth daily.   07/13/2016 at Unknown time  . omeprazole (PRILOSEC) 20 MG capsule Take 20 mg by mouth daily.   Past Week at Unknown time  . traMADol (ULTRAM) 50 MG tablet Take 1 tablet (50 mg total) by mouth every 6 (six) hours  as needed. 12 tablet 0 unknown at Unknown time    Treatment Modalities: Medication Management, Group therapy, Case management,  1 to 1 session with clinician, Psychoeducation, Recreational therapy.   Physician Treatment Plan for Primary Diagnosis: MDD (major depressive disorder), single episode, severe with psychosis ([name]) Long Term Goal(s): Improvement in symptoms so as ready for discharge  Short Term Goals: Ability to verbalize feelings will improve Compliance with prescribed medications will improve Ability to identify triggers associated with substance abuse/mental health issues will improve   Medication Management: Evaluate patient's response, side effects, and tolerance of medication regimen.  Therapeutic Interventions: 1 to 1 sessions, Unit Group sessions and Medication administration.  Evaluation of  Outcomes: Adequate for Discharge  Physician Treatment Plan for Secondary Diagnosis: Principal Problem:   MDD (major depressive disorder), single episode, severe with psychosis ([name]) Active Problems:   PTSD (post-traumatic stress disorder)   MDMA abuse   Cannabis use disorder, severe, dependence ([name])   Alcohol use disorder, moderate, dependence ([name])   Tobacco use disorder   Benzodiazepine abuse   Chronic back pain   Essential hypertension   Long Term Goal(s): Improvement in symptoms so as ready for discharge  Short Term Goals: Ability to verbalize feelings will improve Compliance with prescribed medications will improve Ability to identify triggers associated with substance abuse/mental health issues will improve   Medication Management: Evaluate patient's response, side effects, and tolerance of medication regimen.  Therapeutic Interventions: 1 to 1 sessions, Unit Group sessions and Medication administration.  Evaluation of Outcomes: Adequate for Discharge   07/19/16:  For psychosis:reviewed today - continue risperidone as noted below . Risperidone 1 mg po qhs and 0.5 mg po bid. Cogentin 0.5 mg po daily for EPS.  For insomnia:reviewed - continue trazodone. Continue Trazodone 100 mg po qhs prn.  For PTSD:reviewed - continue celexa. Celexa 20 mg po daily. [facility]  For RSW:NIOEVOJJ same dose. Celexa to 20 mg po daily for depression. Depakote 500 mg po bid - depakote level on 07/18/16- subtherapeutic - will get another level on 07/22/16- dose increased yesterday. Risperidone to augment celexa as well as for psychosis.  For seizure disorder: Will increase Depakote to 500 mg po bid - depakote level -subtherapeutic- 07/18/16- get another level in 5 days.  Depakote level in 5 days.   RN Treatment Plan for Primary Diagnosis: MDD (major depressive disorder), single episode, severe with psychosis ([name]) Long Term Goal(s): Knowledge of  disease and therapeutic regimen to maintain health will improve  Short Term Goals: Ability to identify and develop effective coping behaviors will improve and Compliance with prescribed medications will improve  Medication Management: RN will administer medications as ordered by provider, will assess and evaluate patient's response and provide education to patient for prescribed medication. RN will report any adverse and/or side effects to prescribing provider.  Therapeutic Interventions: 1 on 1 counseling sessions, Psychoeducation, Medication administration, Evaluate responses to treatment, Monitor vital signs and CBGs as ordered, Perform/monitor CIWA, COWS, AIMS and Fall Risk screenings as ordered, Perform wound care treatments as ordered.  Evaluation of Outcomes: Adequate for Discharge    Recreational Therapy Treatment Plan for Primary Diagnosis: MDD (major depressive disorder), single episode, severe with psychosis ([place]) Long Term Goal(s): LTG- Patient will participate in recreation therapy tx in at least 2 group sessions without prompting from LRT.  Short Term Goals: Patient will demonstrate increased ability to follow instructions, as demonstrated by ability to follow LRT instructions on first prompt during recreation therapy group  sessions.  Treatment Modalities: Group and Pet Therapy  Therapeutic Interventions: Psychoeducation  Evaluation of Outcomes: Adequate for Discharge   LCSW Treatment Plan for Primary Diagnosis: MDD (major depressive disorder), single episode, severe with psychosis ([place]) Long Term Goal(s): Safe transition to appropriate next level of care at discharge, Engage patient in therapeutic group addressing interpersonal concerns.  Short Term Goals: Engage patient in aftercare planning with referrals and resources  Therapeutic Interventions: Assess for all discharge needs, 1 to 1 time with Social worker, Explore available resources and support systems, Assess for  adequacy in community support network, Educate family and significant other(s) on suicide prevention, Complete Psychosocial Assessment, Interpersonal group therapy.  Evaluation of Outcomes: Met  Return home with grandfather, follow up [place] in Treatment: Attending groups:Yes Participating in groups: Yes Taking medication as prescribed: Yes Toleration medication: Yes, no side effects reported at this time Family/Significant other contact made: Yes Patient understands diagnosis: No  Limited insight  Discussing patient identified problems/goals with staff: Yes Medical problems stabilized or resolved: Yes Denies suicidal/homicidal ideation: Yes Issues/concerns per patient self-inventory: None Other: N/A  New problem(s) identified: None identified at this time.   New Short Term/Long Term Goal(s): None identified at this time.   Discharge Plan or Barriers:   Reason for Continuation of Hospitalization:   Medication stabilization   Estimated Length of Stay: Likely d/c tomorrow  Attendees: Patient: 07/21/2016  11:19 AM  Physician: Ursula Alert, MD 07/21/2016  11:19 AM  Nursing: Hoy Register, RN 07/21/2016  11:19 AM  RN Care Manager: Lars Pinks, RN 07/21/2016  11:19 AM  Social Worker: Ripley Fraise 07/21/2016  11:19 AM  Recreational Therapist: Laretta Bolster  07/21/2016  11:19 AM  Other: Norberto Sorenson 07/21/2016  11:19 AM  Other:  07/21/2016  11:19 AM    Scribe for Treatment Team:  Roque Lias LCSW 07/21/2016 11:19 AM

## 2016-07-21 NOTE — Progress Notes (Signed)
Recreation Therapy Notes  Date: 07/21/16 Time: 1000 Location: 500 [address] Dayroom  Group Topic: Leisure Education  Goal Area(s) Addresses:  Patient will identify positive leisure activities.  Patient will identify one positive benefit of participation in leisure activities.   Behavioral Response: Engaged  Intervention: Various leisure activities, dry erase marker, dry erase board, eraser  Activity: Leisure Pictionary.  Patients were to pull strips of paper from a can with a leisure activity on it.  Patients were to draw the activity they pulled on the board.  The remaining patients were to guess what the activity is.  The person who guesses the activity will then get an opportunity to pull and draw the next activity.  Education:  Leisure Education, Building control surveyorDischarge Planning  Education Outcome: Acknowledges education/In group clarification offered/Needs additional education  Clinical Observations/Feedback: Pt arrived late but got fully engaged in the activity.  Pt was bright and social with peers. Pt expressed that leisure "helps you communicate with others."     Caroll RancherMarjette Makeyla Govan, LRT/CTRS      Caroll RancherLindsay, Biance Moncrief A 07/21/2016 11:44 AM

## 2016-07-22 LAB — VALPROIC ACID LEVEL: Valproic Acid Lvl: 64 ug/mL (ref 50.0–100.0)

## 2016-07-22 MED ORDER — HYDROCHLOROTHIAZIDE 12.5 MG PO CAPS: 12.5000 mg | ORAL_CAPSULE | Freq: Every day | ORAL | 0 refills | Status: AC

## 2016-07-22 MED ORDER — DIVALPROEX SODIUM 500 MG PO DR TAB: 500.0000 mg | DELAYED_RELEASE_TABLET | Freq: Two times a day (BID) | ORAL | 0 refills | Status: DC

## 2016-07-22 MED ORDER — CITALOPRAM HYDROBROMIDE 20 MG PO TABS: 20.0000 mg | ORAL_TABLET | Freq: Every day | ORAL | 0 refills | Status: DC

## 2016-07-22 MED ORDER — RISPERIDONE 0.5 MG PO TABS: 0.5000 mg | ORAL_TABLET | Freq: Two times a day (BID) | ORAL | 0 refills | Status: DC

## 2016-07-22 MED ORDER — NICOTINE 21 MG/24HR TD PT24: 21.0000 mg | MEDICATED_PATCH | Freq: Every day | TRANSDERMAL | 0 refills | Status: DC

## 2016-07-22 MED ORDER — TRAZODONE HCL 100 MG PO TABS: 100.0000 mg | ORAL_TABLET | Freq: Every day | ORAL | 0 refills | Status: DC

## 2016-07-22 MED ORDER — BENZTROPINE MESYLATE 0.5 MG PO TABS: 0.5000 mg | ORAL_TABLET | Freq: Every day | ORAL | 0 refills | Status: DC

## 2016-07-22 MED ORDER — RISPERIDONE 1 MG PO TABS: 1.0000 mg | ORAL_TABLET | Freq: Every day | ORAL | 0 refills | Status: DC

## 2016-07-22 NOTE — Progress Notes (Signed)
Recreation Therapy Notes  Date: 07/22/26 Time: 1000 Location: 500 Hall Dayroom  Group Topic: Communication, Team Building, Problem Solving  Goal Area(s) Addresses:  Patient will effectively work with peer towards shared goal.  Patient will identify skill used to make activity successful.  Patient will identify how skills used during activity can be used to reach post d/c goals.   Intervention: STEM Activity   Activity: [employer] Tower. In teams, patients were asked to build the tallest freestanding tower possible out of 15 pipe cleaners. Systematically resources were removed, for example patient ability to use both hands and patient ability to verbally communicate.    Education:Social Skills, Discharge Planning.   Education Outcome: Acknowledges education/In group clarification offered/Needs additional education.   Clinical Observations/Feedback: Pt did not attend group.   Caroll RancherMarjette Jovin Fester, LRT/CTRS         Caroll RancherLindsay, Jianna Drabik A 07/22/2016 12:10 PM

## 2016-07-22 NOTE — [facility] Suicide Risk Assessment (Signed)
[facility]   Principal Problem: MDD (major depressive disorder), single episode, severe with psychosis [facility](HCC) Discharge Diagnoses:  Patient Active Problem List   Diagnosis Date Noted  . MDD (major depressive disorder), single episode, severe with psychosis (H[facility]) [F32.3] 07/15/2016  . 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 Cannabis use disorder, severe, dependence ([facility]) [F12.20] 07/15/2016  . Alcohol use disorder, moderate, dependence ([facility]) [F10.20] 07/15/2016  . Tobacco use disorder [F17.200] 07/15/2016  . Benzodiazepine abuse [F13.10] 07/15/2016  . Chronic back pain [M54.9, G89.29] 07/15/2016  . Essential hypertension [I10] 07/15/2016  . PTSD (post-traumatic stress disorder) [F43.10] 07/14/2016    Total Time spent with patient: 30 minutes  Musculoskeletal: Strength & Muscle Tone: within normal limits Gait & Station: normal Patient leans: N/A  Psychiatric Specialty Exam: Review of Systems  Psychiatric/Behavioral: Positive for substance abuse. Negative for depression and hallucinations.  All other systems reviewed and are negative.   Blood pressure 124/87, pulse 86, temperature 97.9 F (36.6 C), temperature source Oral, resp. rate 18, height 5\' 7"  (1.702 m), weight 81.6 kg (180 lb), SpO2 99 %.Body mass index is 28.19 kg/m.  General Appearance: Casual  Eye Contact::  Fair  Speech:  Clear and Coherent409  Volume:  Normal  Mood:  Euthymic  Affect:  Appropriate  Thought Process:  Goal Directed and Descriptions of Associations: Intact  Orientation:  Full (Time, Place, and Person)  Thought Content:  Logical  Suicidal Thoughts:  No  Homicidal Thoughts:  No  Memory:  Immediate;   Fair Recent;   Fair Remote;   Fair  Judgement:  Fair  Insight:  Fair  Psychomotor Activity:  Normal  Concentration:  Fair  Recall:  FiservFair  Fund of Knowledge:Fair  Language: Fair  Akathisia:  No  Handed:  Right  AIMS (if indicated):   0  Assets:  Communication Skills Desire  for Improvement  Sleep:  Number of Hours: 6  Cognition: WNL  ADL's:  Intact   Mental Status Per Nursing Assessment::   On Admission:  NA  Demographic Factors:  Male  Loss Factors: NA  Historical Factors: Impulsivity  Risk Reduction Factors:   Positive social support and Positive therapeutic relationship  Continued Clinical Symptoms:  Alcohol/Substance Abuse/Dependencies  Cognitive Features That Contribute To Risk:  None    Suicide Risk:  Minimal: No identifiable suicidal ideation.  Patients presenting with no risk factors but with morbid ruminations; may be classified as minimal risk based on the severity of the depressive symptoms  Follow-up Information    [facility] Call.   Why:  At discharge, all to setup appointment with Dr. Lovell SheehanJenkins.  Contact information: [address], [place], [place] [address]  Telephone: [phone] Fax: [phone]        [facility].   Why:  Appointment is Monday, February 26, at [facility]9AM   Be sure to bring driver's license or picture ID, Social security card, and insurance card.    Contact information: [address] [place] [address] [phone]           Plan Of Care/Follow-up recommendations:  Activity:  no restrictions Diet:  regular Tests:  as needed Other:  follow up with aftercare  Arnez Stoneking, MD 07/22/2016, 9:33 AM

## 2016-07-22 NOTE — Discharge Summary (Signed)
Physician Discharge Summary Note  Patient:  Philip Gonzalez is an 51 y.o., male MRN:  [id number] DOB:  Feb 23, 1966 Patient phone:  [phone] (home)  Patient address:   [address] [place] [address],  Total Time spent with patient: 30 minutes  Date of Admission:  07/14/2016 Date of Discharge: 07/22/2016  Reason for Admission:  Worsening paranoia  Principal Problem: MDD (major depressive disorder), single episode, severe with psychosis [facility]) Discharge Diagnoses: Patient Active Problem List   Diagnosis Date Noted  . MDD (major depressive disorder), single episode, severe with psychosis (HCC) [F32.3] 07/15/2016  . 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 Cannabis use disorder, severe, dependence (HCC) [F12.20] 07/15/2016  . Alcohol use disorder, moderate, dependence (HCC) [F10.20] 07/15/2016  . Tobacco use disorder [F17.200] 07/15/2016  . Benzodiazepine abuse [F13.10] 07/15/2016  . Chronic back pain [M54.9, G89.29] 07/15/2016  . Essential hypertension [I10] 07/15/2016  . PTSD (post-traumatic stress disorder) [F43.10] 07/14/2016    Past Psychiatric History: see HPI  Past Medical History:  Past Medical History:  Diagnosis Date  . Acid reflux   . Hemorrhoid   . Hypertension   . PTSD (post-traumatic stress disorder)   . STD (male)    History reviewed. No pertinent surgical history. Family History:  Family History  Problem Relation Age of Onset  . Cancer Mother   . Depression Mother   . Depression Maternal Aunt    Family Psychiatric  History: see HPI Social History:  History  Alcohol Use  . Yes    Comment: occ     History  Drug Use No    Social History   Social History  . Marital status: Divorced    Spouse name: N/A  . Number of children: N/A  . Years of education: N/A   Social History Main Topics  . Smoking status: Smoker, Current Status Unknown    Packs/day: 1.00    Years: 14.00    Types: Cigarettes    Last attempt to quit: 01/18/2016  . Smokeless  tobacco: Never Used  . Alcohol use Yes     Comment: occ  . Drug use: No  . Sexual activity: Yes    Birth control/ protection: Condom   Other Topics Concern  . None   Social History Narrative  . None   Hospital Course: Philip Gonzalez an 50 y.o.male, veteranwho was just in the ED yesterday with same complaints of paranoia, worsening symptoms.  Patient was thinking that people are trying to shoot him.  Patient has hx of PTSD.  Per chart review, collateral information from family revealed that pt would be up "all night checking windows and doors fearful that someone was going to kill him".  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   Philip Gonzalez was admitted for MDD (major depressive disorder), single episode, severe with psychosis (HCC) and crisis management.  Patient was treated with medications with their indications listed below in detail under Medication List.  Medical problems were identified and treated as needed.  Home medications were restarted as appropriate.  Improvement was monitored by observation and Philip Gonzalez daily report of symptom reduction.  Emotional and mental status was monitored by daily self inventory reports completed by Philip Gonzalez and clinical staff.  Patient reported continued improvement, denied any new concerns.  Patient had been compliant on medications and denied side effects.  Support and encouragement was provided.    Patient encouraged to attend  groups to help with recognizing triggers of emotional crises and de-stabilizations.  Patient encouraged to attend group to help identify the positive things in life that would help in dealing with feelings of loss, depression and unhealthy or abusive tendencies.         Philip Gonzalez was evaluated by the treatment team for stability and plans for continued recovery upon discharge.  Patient was offered further treatment options upon discharge including  Residential, Intensive Outpatient and Outpatient treatment. Patient will follow up with agency listed below for medication management and counseling.  Encouraged patient to maintain satisfactory support network and home environment.  Advised to adhere to medication compliance and outpatient treatment follow up.  Prescriptions provided.       Philip Gonzalez motivation was an integral factor for scheduling further treatment.  Employment, transportation, bed availability, health status, family support, and any pending legal issues were also considered during patient's hospital stay.  Upon completion of this admission the patient was both mentally and medically stable for discharge denying suicidal/homicidal ideation, auditory/visual/tactile hallucinations, delusional thoughts and paranoia.      Physical Findings: AIMS: Facial and Oral Movements Muscles of Facial Expression: None, normal Lips and Perioral Area: None, normal Jaw: None, normal Tongue: None, normal,Extremity Movements Upper (arms, wrists, hands, fingers): None, normal Lower (legs, knees, ankles, toes): None, normal, Trunk Movements Neck, shoulders, hips: None, normal, Overall Severity Severity of abnormal movements (highest score from questions above): None, normal Incapacitation due to abnormal movements: None, normal Patient's awareness of abnormal movements (rate only patient's report): No Awareness, Dental Status Current problems with teeth and/or dentures?: No Does patient usually wear dentures?: No  CIWA:  CIWA-Ar Total: 1 COWS:     Musculoskeletal: Strength & Muscle Tone: within normal limits Gait & Station: normal Patient leans: N/A  Psychiatric Specialty Exam: Physical Exam  Nursing note and vitals reviewed. Psychiatric: He has a normal mood and affect. His speech is normal and behavior is normal. Judgment and thought content normal. Cognition and memory are normal.    ROS  Blood pressure 124/87, pulse 86,  temperature 97.9 F (36.6 C), temperature source Oral, resp. rate 18, height 5\' 7"  (1.702 m), weight 81.6 kg (180 lb), SpO2 99 %.Body mass index is 28.19 kg/m.   Have you used any form of tobacco in the last 30 days? (Cigarettes, Smokeless Tobacco, Cigars, and/or Pipes): Yes  Has this patient used any form of tobacco in the last 30 days? (Cigarettes, Smokeless Tobacco, Cigars, and/or Pipes) Yes, N/A  Blood Alcohol level:  Lab Results  Component Value Date   ETH <5 07/14/2016   ETH <5 07/13/2016    Metabolic Disorder Labs:  Lab Results  Component Value Date   HGBA1C 5.2 07/16/2016   MPG 103 07/16/2016   Lab Results  Component Value Date   PROLACTIN 24.0 (H) 07/16/2016   Lab Results  Component Value Date   CHOL 180 07/16/2016   TRIG 148 07/16/2016   HDL 71 07/16/2016   CHOLHDL 2.5 07/16/2016   VLDL 30 07/16/2016   LDLCALC 79 07/16/2016    See Psychiatric Specialty Exam and Suicide Risk Assessment completed by Attending Physician prior to discharge.  Discharge destination:  Home  Is patient on multiple antipsychotic therapies at discharge:  No   Has Patient had three or more failed trials of antipsychotic monotherapy by history:  No  Recommended Plan for Multiple Antipsychotic Therapies: NA   Allergies as of 07/22/2016      Reactions  Bee Venom Swelling   Bodily Swelling   Poison Oak Extract [poison Oak Extract] Rash      Medication List    STOP taking these medications   aspirin 325 MG tablet   hydrochlorothiazide 25 MG tablet Commonly known as:  HYDRODIURIL Replaced by:  hydrochlorothiazide 12.5 MG capsule   omeprazole 20 MG capsule Commonly known as:  PRILOSEC   traMADol 50 MG tablet Commonly known as:  ULTRAM     TAKE these medications     Indication  benztropine 0.5 MG tablet Commonly known as:  COGENTIN Take 1 tablet (0.5 mg total) by mouth at bedtime.  Indication:  Extrapyramidal Reaction caused by Medications   citalopram 20 MG  tablet Commonly known as:  CELEXA Take 1 tablet (20 mg total) by mouth daily. Start taking on:  07/23/2016  Indication:  Depression   divalproex 500 MG DR tablet Commonly known as:  DEPAKOTE Take 1 tablet (500 mg total) by mouth every 12 (twelve) hours.  Indication:  mood stabilization   hydrochlorothiazide 12.5 MG capsule Commonly known as:  MICROZIDE Take 1 capsule (12.5 mg total) by mouth daily. Start taking on:  07/23/2016 Replaces:  hydrochlorothiazide 25 MG tablet  Indication:  High Blood Pressure Disorder   nicotine 21 mg/24hr patch Commonly known as:  NICODERM CQ - dosed in mg/24 hours Place 1 patch (21 mg total) onto the skin daily. Start taking on:  07/23/2016  Indication:  Nicotine Addiction   risperiDONE 1 MG tablet Commonly known as:  RISPERDAL Take 1 tablet (1 mg total) by mouth at bedtime.  Indication:  mood stabilization   risperiDONE 0.5 MG tablet Commonly known as:  RISPERDAL Take 1 tablet (0.5 mg total) by mouth 2 (two) times daily.  Indication:  mood stabilization   traZODone 100 MG tablet Commonly known as:  DESYREL Take 1 tablet (100 mg total) by mouth at bedtime.  Indication:  Trouble Sleeping      Follow-up Information    [place]. Call.   Why:  At discharge, all to setup appointment with Dr. Lovell Sheehan.  Contact information: [address] [place], [place] [address]  Telephone: [phone] Fax: [phone]        [facility]. Go on 07/25/2016.   Why:  Appointment is Monday, February 26, at [facility]   Be sure to bring driver's license or picture ID, Social security card, and insurance card.    Contact information: [address] [place] [address] [phone]           Follow-up recommendations:  Activity:  as tol Diet:  as tol  Comments:  1.  Take all your medications as prescribed.   2.  Report any adverse side effects to outpatient provider. 3.  Patient instructed to not use alcohol or illegal drugs while on  prescription medicines. 4.  In the event of worsening symptoms, instructed patient to call 911, the crisis hotline or go to nearest emergency room for evaluation of symptoms.  Signed: Lindwood Qua, NP [facility] 07/22/2016, 2:28 PM

## 2016-07-22 NOTE — Progress Notes (Signed)
Pt discharged to lobby waiting for grandpa to pick him up. Pt was ambulatory, stable and appreciative at that time. All papers and prescriptions were given and valuables returned. Verbal understanding expressed. Denies SI/HI and A/VH. Pt given opportunity to express concerns and ask questions.

## 2016-07-22 NOTE — Plan of Care (Signed)
Problem: [facility] Participation in Recreation Therapeutic Interventions Goal: STG-Patient will attend/participate in Rec Therapy Group Ses STG-The Patient will attend and participate in Recreation Therapy Group Sessions  Outcome: Completed/Met Date Met: 07/22/16 Pt attended and participated in leisure education and self-esteem recreation therapy sessions.  Philip Gonzalez, LRT/CTRS

## 2016-07-22 NOTE — Progress Notes (Signed)
  [facility] Discharge Plan :  Will you be returning to the same living situation after discharge:  Yes,  home At discharge, do you have transportation home?: Yes,  [place] Do you have the ability to pay for your medications: Yes,  [facility]  Release of information consent forms completed and in the chart;  Patient's signature needed at discharge.  Patient to Follow up at: Follow-up Information    [place] [facility]. Call.   Why:  At discharge, all to setup appointment with Dr. Lovell SheehanJenkins.  Contact information: [address], [place], [place] [address]  Telephone: [phone]) [phone] Fax: [phone]) [phone]        [facility]. Go on 07/25/2016.   Why:  [facility] Monday, February 26, [facility]   Be sure to bring driver's license or picture ID, Social security card, and insurance card.    Contact information: [address] [place] [address] [phone]           Next level of care provider has access to [facility] Link:no  Safety Planning and Suicide Prevention discussed: Yes,  yes  Have you used any form of tobacco in the last 30 days? (Cigarettes, Smokeless Tobacco, Cigars, and/or Pipes): Yes  Has patient been referred to the Quitline?: Patient refused referral  Patient has been referred for addiction treatment: Pt. refused referral  Philip Gonzalez 07/22/2016, 10:09 AM

## 2016-10-03 ENCOUNTER — Emergency Department (HOSPITAL_COMMUNITY)
Admission: EM | Admit: 2016-10-03 | Discharge: 2016-10-03 | Disposition: A | Discharge status: Home / Self Care | Payer: Self-pay | Attending: Emergency Medicine | Admitting: Emergency Medicine

## 2016-10-03 ENCOUNTER — Encounter (HOSPITAL_COMMUNITY): Payer: Self-pay | Admitting: Cardiology

## 2016-10-03 DIAGNOSIS — M542 Cervicalgia: Secondary | ICD-10-CM | POA: Insufficient documentation

## 2016-10-03 DIAGNOSIS — I10 Essential (primary) hypertension: Secondary | ICD-10-CM | POA: Insufficient documentation

## 2016-10-03 DIAGNOSIS — Z79899 Other long term (current) drug therapy: Secondary | ICD-10-CM | POA: Insufficient documentation

## 2016-10-03 DIAGNOSIS — M546 Pain in thoracic spine: Secondary | ICD-10-CM | POA: Insufficient documentation

## 2016-10-03 DIAGNOSIS — F17210 Nicotine dependence, cigarettes, uncomplicated: Secondary | ICD-10-CM | POA: Insufficient documentation

## 2016-10-03 DIAGNOSIS — G8929 Other chronic pain: Secondary | ICD-10-CM | POA: Insufficient documentation

## 2016-10-03 DIAGNOSIS — N341 Nonspecific urethritis: Secondary | ICD-10-CM | POA: Insufficient documentation

## 2016-10-03 DIAGNOSIS — R197 Diarrhea, unspecified: Secondary | ICD-10-CM | POA: Insufficient documentation

## 2016-10-03 DIAGNOSIS — N342 Other urethritis: Secondary | ICD-10-CM

## 2016-10-03 LAB — URINALYSIS, ROUTINE W REFLEX MICROSCOPIC
Bilirubin Urine: NEGATIVE
Glucose, UA: NEGATIVE mg/dL
Hgb urine dipstick: NEGATIVE
Ketones, ur: NEGATIVE mg/dL
Leukocytes, UA: NEGATIVE
Nitrite: NEGATIVE
PH: 5 (ref 5.0–8.0)
Protein, ur: NEGATIVE mg/dL
SPECIFIC GRAVITY, URINE: 1.023 (ref 1.005–1.030)

## 2016-10-03 MED ORDER — LIDOCAINE HCL (PF) 1 % IJ SOLN
INTRAMUSCULAR | Status: AC
  Filled 2016-10-03: qty 5

## 2016-10-03 MED ORDER — CEFTRIAXONE SODIUM 250 MG IJ SOLR
250.0000 mg | Freq: Once | INTRAMUSCULAR | Status: AC
  Administered 2016-10-03: 250 mg via INTRAMUSCULAR
  Filled 2016-10-03: qty 250

## 2016-10-03 MED ORDER — AZITHROMYCIN 250 MG PO TABS
1000.0000 mg | ORAL_TABLET | Freq: Once | ORAL | Status: AC
  Administered 2016-10-03: 1000 mg via ORAL
  Filled 2016-10-03: qty 4

## 2016-10-03 NOTE — ED Triage Notes (Addendum)
Left shoulder , left back pain.   Also c/o pain with urination and penile discharge.

## 2016-10-03 NOTE — Discharge Instructions (Signed)
You have been treated for gonorrhea and chlamydia with the medicines that you received here.  Your lab tests should result in 2 days.  You will be notified if any of your tests are positive.  Your partner will need to get treated as well if you are positive for infection.  Do not have sex for the next week or until your symptoms have completely resolved.

## 2016-10-03 NOTE — ED Provider Notes (Signed)
[facility]-EMERGENCY DEPT Provider Note    By signing my name below, I, Earmon PhoenixJennifer Waddell, attest that this documentation has been prepared under the direction and in the presence of Burgess AmorJulie Matyas Baisley, PA-C. Electronically Signed: Earmon PhoenixJennifer Waddell, ED Scribe. 10/03/16. 5:06 PM.    History   Chief Complaint Chief Complaint  Patient presents with  . Back Pain  . Dysuria    The history is provided by the patient and medical records. No language interpreter was used.    Philip FittingOscar G Gonzalez is a 51 y.o. male with PMHx of HTN, hemorrhoids, chronic back pain, polysubstance abuse and STD who presents to the Emergency Department complaining of penile discharge that began one week ago. He reports associated dysuria and nausea. He also reports left shoulder and neck pain which is chronic in nature and unchanged. He reports some diarrhea for the past week reporting one episode of watery diarrhea daily, but was more formed today. 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 He reports unprotected sex with a new partner recently. He has not taken anything for his symptoms. Moving the shoulder increases his pain. He denies any other modifying factors of any other complaint. He denies fever, chills, vomiting. He denies allergies to any medications. He states he is a smoker.   Past Medical History:  Diagnosis Date  . Acid reflux   . Hemorrhoid   . Hypertension   . PTSD (post-traumatic stress disorder)   . STD (male)     Patient Active Problem List   Diagnosis Date Noted  . MDD (major depressive disorder), single episode, severe with psychosis (HCC) 07/15/2016  . 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 Cannabis use disorder, severe, dependence (HCC) 07/15/2016  . Alcohol use disorder, moderate, dependence (HCC) 07/15/2016  . Tobacco use disorder 07/15/2016  . Benzodiazepine abuse 07/15/2016  . Chronic back pain 07/15/2016  . Essential hypertension 07/15/2016  . PTSD (post-traumatic stress  disorder) 07/14/2016    History reviewed. No pertinent surgical history.     Home Medications    Prior to Admission medications   Medication Sig Start Date End Date Taking? Authorizing Provider  hydrochlorothiazide (MICROZIDE) 12.5 MG capsule Take 1 capsule (12.5 mg total) by mouth daily. 07/23/16  Yes Adonis BrookAgustin, Sheila, NP  benztropine (COGENTIN) 0.5 MG tablet Take 1 tablet (0.5 mg total) by mouth at bedtime. Patient not taking: Reported on 10/03/2016 07/22/16   Adonis BrookAgustin, Sheila, NP  citalopram (CELEXA) 20 MG tablet Take 1 tablet (20 mg total) by mouth daily. Patient not taking: Reported on 10/03/2016 07/23/16   Adonis BrookAgustin, Sheila, NP  divalproex (DEPAKOTE) 500 MG DR tablet Take 1 tablet (500 mg total) by mouth every 12 (twelve) hours. Patient not taking: Reported on 10/03/2016 07/22/16   Adonis BrookAgustin, Sheila, NP  nicotine (NICODERM CQ - DOSED IN MG/24 HOURS) 21 mg/24hr patch Place 1 patch (21 mg total) onto the skin daily. Patient not taking: Reported on 10/03/2016 07/23/16   Adonis BrookAgustin, Sheila, NP  risperiDONE (RISPERDAL) 0.5 MG tablet Take 1 tablet (0.5 mg total) by mouth 2 (two) times daily. Patient not taking: Reported on 10/03/2016 07/22/16   Adonis BrookAgustin, Sheila, NP  risperiDONE (RISPERDAL) 1 MG tablet Take 1 tablet (1 mg total) by mouth at bedtime. Patient not taking: Reported on 10/03/2016 07/22/16   Adonis BrookAgustin, Sheila, NP  traZODone (DESYREL) 100 MG tablet Take 1 tablet (100 mg total) by mouth at bedtime. Patient not taking: Reported on 10/03/2016 07/22/16   Adonis BrookAgustin, Sheila, NP    Family  History Family History  Problem Relation Age of Onset  . Cancer Mother   . Depression Mother   . Depression Maternal Aunt     Social History Social History  Substance Use Topics  . Smoking status: Smoker, Current Status Unknown    Packs/day: 1.00    Years: 14.00    Types: Cigarettes    Last attempt to quit: 01/18/2016  . Smokeless tobacco: Never Used  . Alcohol use Yes     Comment: occ     Allergies   Bee venom and  Poison oak extract [poison oak extract]   Review of Systems Review of Systems  Constitutional: Negative for chills and fever.  HENT: Negative for congestion and sore throat.   Eyes: Negative.   Respiratory: Negative for chest tightness and shortness of breath.   Cardiovascular: Negative for chest pain.  Gastrointestinal: Positive for blood in stool, diarrhea and nausea. Negative for abdominal pain and vomiting.  Genitourinary: Positive for discharge and dysuria.  Musculoskeletal: Positive for myalgias and neck pain. Negative for arthralgias and joint swelling.  Skin: Negative.  Negative for rash and wound.  Neurological: Negative for dizziness, weakness, light-headedness, numbness and headaches.  Psychiatric/Behavioral: Negative.      Physical Exam Updated Vital Signs BP 131/85 (BP Location: Right Arm)   Pulse 62   Temp 98 F (36.7 C) (Oral)   Resp 18   Ht 5\' 7"  (1.702 m)   Wt 175 lb (79.4 kg)   SpO2 95%   BMI 27.41 kg/m   Physical Exam  Constitutional: He is oriented to person, place, and time. He appears well-developed and well-nourished.  HENT:  Head: Normocephalic and atraumatic.  Eyes: Conjunctivae are normal.  Neck: Normal range of motion.  Cardiovascular: Normal rate and regular rhythm.   Pulmonary/Chest: Effort normal.  Abdominal: Soft. Bowel sounds are normal. He exhibits no distension. There is no tenderness. There is no guarding.  Musculoskeletal: Normal range of motion.  Neurological: He is alert and oriented to person, place, and time.  Skin: Skin is warm and dry.  Psychiatric: He has a normal mood and affect. His behavior is normal.  Nursing note and vitals reviewed.    ED Treatments / Results  DIAGNOSTIC STUDIES: Oxygen Saturation is 95% on RA, adequate by my interpretation.   COORDINATION OF CARE: 4:16 PM- Will check and treat for GC/chlamydia. Will refer to PCP. Pt verbalizes understanding and agrees to plan.  Medications  lidocaine (PF)  (XYLOCAINE) 1 % injection (not administered)  cefTRIAXone (ROCEPHIN) injection 250 mg (250 mg Intramuscular Given 10/03/16 1633)  azithromycin (ZITHROMAX) tablet 1,000 mg (1,000 mg Oral Given 10/03/16 1632)    Labs (all labs ordered are listed, but only abnormal results are displayed) Labs Reviewed  URINALYSIS, ROUTINE W REFLEX MICROSCOPIC  HIV ANTIBODY (ROUTINE TESTING)  RPR  GC/CHLAMYDIA PROBE AMP ([place]) NOT AT [facility]    EKG  EKG Interpretation None       Radiology No results found.  Procedures Procedures (including critical care time)  Medications Ordered in ED Medications  lidocaine (PF) (XYLOCAINE) 1 % injection (not administered)  cefTRIAXone (ROCEPHIN) injection 250 mg (250 mg Intramuscular Given 10/03/16 1633)  azithromycin (ZITHROMAX) tablet 1,000 mg (1,000 mg Oral Given 10/03/16 1632)     Initial Impression / Assessment and Plan / ED Course  I have reviewed the triage vital signs and the nursing notes.  Pertinent labs & imaging results that were available during my care of the patient were reviewed by me and  considered in my medical decision making (see chart for details).     Patient treated in the ED for STI with Zithromax and Rocephin. Patient advised to inform and treat all sexual partners. Pt advised on safe sex practices and understands that they have GC/Chlamydia cultures pending and will result in 2-3 days. HIV and RPR will be sent. Pt encouraged to follow up at local health department for future STI checks. Was also given referral for pcp with [facility].  No concern for prostatitis or epididymitis given no pain except with urination. Denies scrotal pain. Discussed return precautions. Pt appears safe for discharge.    Final Clinical Impressions(s) / ED Diagnoses   Final diagnoses:  Urethritis  Chronic bilateral thoracic back pain    New Prescriptions New Prescriptions   No medications on file    I personally performed the services  described in this documentation, which was scribed in my presence. The recorded information has been reviewed and is accurate.      Burgess Amor, PA-C 10/03/16 1737    Vanetta Mulders, MD 10/11/16 [phone]

## 2016-10-04 LAB — HIV ANTIBODY (ROUTINE TESTING W REFLEX): HIV SCREEN 4TH GENERATION: NONREACTIVE

## 2016-10-04 LAB — GC/CHLAMYDIA PROBE AMP (~~LOC~~) NOT AT ARMC
CHLAMYDIA, DNA PROBE: NEGATIVE
NEISSERIA GONORRHEA: NEGATIVE

## 2016-10-04 LAB — RPR: RPR: NONREACTIVE

## 2017-01-16 ENCOUNTER — Encounter (HOSPITAL_COMMUNITY): Payer: Self-pay | Admitting: Emergency Medicine

## 2017-01-16 ENCOUNTER — Emergency Department (HOSPITAL_COMMUNITY)
Admission: EM | Admit: 2017-01-16 | Discharge: 2017-01-16 | Disposition: A | Discharge status: Home / Self Care | Payer: Self-pay | Attending: Emergency Medicine | Admitting: Emergency Medicine

## 2017-01-16 DIAGNOSIS — I10 Essential (primary) hypertension: Secondary | ICD-10-CM | POA: Insufficient documentation

## 2017-01-16 DIAGNOSIS — Z79899 Other long term (current) drug therapy: Secondary | ICD-10-CM | POA: Insufficient documentation

## 2017-01-16 DIAGNOSIS — B356 Tinea cruris: Secondary | ICD-10-CM | POA: Insufficient documentation

## 2017-01-16 DIAGNOSIS — F17210 Nicotine dependence, cigarettes, uncomplicated: Secondary | ICD-10-CM | POA: Insufficient documentation

## 2017-01-16 MED ORDER — TERBINAFINE HCL 1 % EX CREA: 1.0000 "application " | TOPICAL_CREAM | Freq: Two times a day (BID) | CUTANEOUS | 0 refills | Status: AC

## 2017-01-16 NOTE — ED Triage Notes (Signed)
Pt c/o itching to genital area and all over x 1 week. Noticed rash around genital area today. Denies d/c from penis or gu sx. Nad.

## 2017-01-16 NOTE — ED Provider Notes (Signed)
[facility]-EMERGENCY DEPT Provider Note   CSN: [id number] Arrival date & time: 01/16/17  1021     History   Chief Complaint Chief Complaint  Patient presents with  . Rash    HPI Philip Gonzalez is a 51 y.o. male.  HPI Patient presents with groin itching on the right side. Began a few days ago. States he's had jock itch in the past and this feels like that. Denies penile discharge or dysuria. Denies other rash. No fevers. Past Medical History:  Diagnosis Date  . Acid reflux   . Hemorrhoid   . Hypertension   . PTSD (post-traumatic stress disorder)   . STD (male)     Patient Active Problem List   Diagnosis Date Noted  . MDD (major depressive disorder), single episode, severe with psychosis (HCC) 07/15/2016  . 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 Cannabis use disorder, severe, dependence (HCC) 07/15/2016  . Alcohol use disorder, moderate, dependence (HCC) 07/15/2016  . Tobacco use disorder 07/15/2016  . Benzodiazepine abuse 07/15/2016  . Chronic back pain 07/15/2016  . Essential hypertension 07/15/2016  . PTSD (post-traumatic stress disorder) 07/14/2016    History reviewed. No pertinent surgical history.     Home Medications    Prior to Admission medications   Medication Sig Start Date End Date Taking? Authorizing Provider  hydrochlorothiazide (MICROZIDE) 12.5 MG capsule Take 1 capsule (12.5 mg total) by mouth daily. 07/23/16  Yes Adonis Brook, NP  omeprazole (PRILOSEC) 20 MG capsule Take 20 mg by mouth 2 (two) times daily before a meal.   Yes [provider]  benztropine (COGENTIN) 0.5 MG tablet Take 1 tablet (0.5 mg total) by mouth at bedtime. Patient not taking: Reported on 10/03/2016 07/22/16   Adonis Brook, NP  citalopram (CELEXA) 20 MG tablet Take 1 tablet (20 mg total) by mouth daily. Patient not taking: Reported on 10/03/2016 07/23/16   Adonis Brook, NP  divalproex (DEPAKOTE) 500 MG DR tablet Take 1 tablet (500 mg total) by mouth every 12 (twelve) hours. Patient  not taking: Reported on 10/03/2016 07/22/16   Adonis Brook, NP  risperiDONE (RISPERDAL) 0.5 MG tablet Take 1 tablet (0.5 mg total) by mouth 2 (two) times daily. Patient not taking: Reported on 10/03/2016 07/22/16   Adonis Brook, NP  risperiDONE (RISPERDAL) 1 MG tablet Take 1 tablet (1 mg total) by mouth at bedtime. Patient not taking: Reported on 10/03/2016 07/22/16   Adonis Brook, NP  terbinafine (LAMISIL AT) 1 % cream Apply 1 application topically 2 (two) times daily. 01/16/17 01/23/17  Benjiman Core, MD  traZODone (DESYREL) 100 MG tablet Take 1 tablet (100 mg total) by mouth at bedtime. Patient not taking: Reported on 10/03/2016 07/22/16   Adonis Brook, NP    Family History Family History  Problem Relation Age of Onset  . Cancer Mother   . Depression Mother   . Depression Maternal Aunt     Social History Social History  Substance Use Topics  . Smoking status: Smoker, Current Status Unknown    Packs/day: 1.00    Years: 14.00    Types: Cigarettes    Last attempt to quit: 01/18/2016  . Smokeless tobacco: Never Used  . Alcohol use Yes     Comment: occ     Allergies   Bee venom and Poison oak extract [poison oak extract]   Review of Systems Review of Systems  Constitutional: Negative for chills and fever.  Genitourinary: Negative for discharge, dysuria, genital sores, hematuria, penile pain and testicular pain.  Skin: Positive  for rash.     Physical Exam Updated Vital Signs BP (!) 150/103 (BP Location: Left Arm)   Pulse 86   Temp 98.1 F (36.7 C) (Oral)   Resp 16   Ht 5\' 7"  (1.702 m)   Wt 88.5 kg (195 lb)   SpO2 96%   BMI 30.54 kg/m   Physical Exam  Constitutional: He appears well-developed and well-nourished.  Genitourinary: Penis normal.  Genitourinary Comments: Right proximal inner thigh has an approximately 3 cm rounded lesion. Slightly scaly. No erythema. It is round to oval-shaped. No induration.  Musculoskeletal:  Patient has a tracking bracelet on  his right ankle  Neurological: He is alert.  Skin: Skin is warm.     ED Treatments / Results  Labs (all labs ordered are listed, but only abnormal results are displayed) Labs Reviewed - No data to display  EKG  EKG Interpretation None       Radiology No results found.  Procedures Procedures (including critical care time)  Medications Ordered in ED Medications - No data to display   Initial Impression / Assessment and Plan / ED Course  I have reviewed the triage vital signs and the nursing notes.  Pertinent labs & imaging results that were available during my care of the patient were reviewed by me and considered in my medical decision making (see chart for details).     Patient with likely tinea on right inner thigh. Doubt STD. Will give prescription. Discharge home.  Final Clinical Impressions(s) / ED Diagnoses   Final diagnoses:  Tinea cruris    New Prescriptions Discharge Medication List as of 01/16/2017 10:52 AM    START taking these medications   Details  terbinafine (LAMISIL AT) 1 % cream Apply 1 application topically 2 (two) times daily., Starting Mon 01/16/2017, Until Mon 01/23/2017, Print         Benjiman Core, MD 01/16/17 1114

## 2017-05-12 ENCOUNTER — Other Ambulatory Visit: Payer: Self-pay

## 2017-05-12 ENCOUNTER — Encounter (HOSPITAL_COMMUNITY): Payer: Self-pay | Admitting: Emergency Medicine

## 2017-05-12 ENCOUNTER — Emergency Department (HOSPITAL_COMMUNITY)
Admission: EM | Admit: 2017-05-12 | Discharge: 2017-05-12 | Disposition: A | Discharge status: Home / Self Care | Payer: Self-pay | Attending: Emergency Medicine | Admitting: Emergency Medicine

## 2017-05-12 DIAGNOSIS — F17210 Nicotine dependence, cigarettes, uncomplicated: Secondary | ICD-10-CM | POA: Insufficient documentation

## 2017-05-12 DIAGNOSIS — N341 Nonspecific urethritis: Secondary | ICD-10-CM | POA: Insufficient documentation

## 2017-05-12 DIAGNOSIS — N342 Other urethritis: Secondary | ICD-10-CM

## 2017-05-12 DIAGNOSIS — I10 Essential (primary) hypertension: Secondary | ICD-10-CM | POA: Insufficient documentation

## 2017-05-12 DIAGNOSIS — Z79899 Other long term (current) drug therapy: Secondary | ICD-10-CM | POA: Insufficient documentation

## 2017-05-12 LAB — URINALYSIS, ROUTINE W REFLEX MICROSCOPIC
BILIRUBIN URINE: NEGATIVE
Glucose, UA: NEGATIVE mg/dL
Hgb urine dipstick: NEGATIVE
KETONES UR: NEGATIVE mg/dL
LEUKOCYTES UA: NEGATIVE
NITRITE: NEGATIVE
Protein, ur: NEGATIVE mg/dL
SPECIFIC GRAVITY, URINE: 1.018 (ref 1.005–1.030)
pH: 6 (ref 5.0–8.0)

## 2017-05-12 MED ORDER — CEFTRIAXONE SODIUM 250 MG IJ SOLR
250.0000 mg | Freq: Once | INTRAMUSCULAR | Status: AC
  Administered 2017-05-12: 250 mg via INTRAMUSCULAR
  Filled 2017-05-12: qty 250

## 2017-05-12 MED ORDER — LIDOCAINE HCL (PF) 1 % IJ SOLN
INTRAMUSCULAR | Status: AC
  Administered 2017-05-12: 0.9 mL
  Filled 2017-05-12: qty 5

## 2017-05-12 MED ORDER — AZITHROMYCIN 250 MG PO TABS
1000.0000 mg | ORAL_TABLET | Freq: Once | ORAL | Status: AC
  Administered 2017-05-12: 1000 mg via ORAL
  Filled 2017-05-12: qty 4

## 2017-05-12 NOTE — Discharge Instructions (Signed)
Follow-up with the health dept for recheck

## 2017-05-12 NOTE — ED Triage Notes (Signed)
PT c/o penile discharge with painful urination x4 days and states possible exposure to STD.

## 2017-05-14 NOTE — ED Provider Notes (Signed)
[facility]   CSN: [id number] Arrival date & time: 05/12/17  1107     History   Chief Complaint Chief Complaint  Patient presents with  . Penile Discharge    HPI Philip Gonzalez is a 51 y.o. male.  HPI   Philip Gonzalez is a 51 y.o. male who presents to the Emergency Department complaining of penile discharge and pain burning with urination.  Symptoms have been present for 4 days.  He admits to recent unprotected intercourse and is concerned he may have an STD.  He describes the discharge is clear.  He denies pain or swelling to his testicles, abdominal pain, fever, chills, and rash or lesions to his genitals.  Past Medical History:  Diagnosis Date  . Acid reflux   . Hemorrhoid   . Hypertension   . PTSD (post-traumatic stress disorder)   . STD (male)     Patient Active Problem List   Diagnosis Date Noted  . MDD (major depressive disorder), single episode, severe with psychosis (HCC) 07/15/2016  . MDMA abuse (HCC) 07/15/2016  . Cannabis use disorder, severe, dependence (HCC) 07/15/2016  . Alcohol use disorder, moderate, dependence (HCC) 07/15/2016  . Tobacco use disorder 07/15/2016  . Benzodiazepine abuse (HCC) 07/15/2016  . Chronic back pain 07/15/2016  . Essential hypertension 07/15/2016  . PTSD (post-traumatic stress disorder) 07/14/2016    History reviewed. No pertinent surgical history.     Home Medications    Prior to Admission medications   Medication Sig Start Date End Date Taking? Authorizing Provider  benztropine (COGENTIN) 0.5 MG tablet Take 1 tablet (0.5 mg total) by mouth at bedtime. Patient not taking: Reported on 10/03/2016 07/22/16   Adonis BrookAgustin, Sheila, NP  citalopram (CELEXA) 20 MG tablet Take 1 tablet (20 mg total) by mouth daily. Patient not taking: Reported on 10/03/2016 07/23/16   Adonis BrookAgustin, Sheila, NP  divalproex (DEPAKOTE) 500 MG DR tablet Take 1 tablet (500 mg total) by mouth every 12 (twelve) hours. Patient  not taking: Reported on 10/03/2016 07/22/16   Adonis BrookAgustin, Sheila, NP  hydrochlorothiazide (MICROZIDE) 12.5 MG capsule Take 1 capsule (12.5 mg total) by mouth daily. 07/23/16   Adonis BrookAgustin, Sheila, NP  omeprazole (PRILOSEC) 20 MG capsule Take 20 mg by mouth 2 (two) times daily before a meal.    [provider]  risperiDONE (RISPERDAL) 0.5 MG tablet Take 1 tablet (0.5 mg total) by mouth 2 (two) times daily. Patient not taking: Reported on 10/03/2016 07/22/16   Adonis BrookAgustin, Sheila, NP  risperiDONE (RISPERDAL) 1 MG tablet Take 1 tablet (1 mg total) by mouth at bedtime. Patient not taking: Reported on 10/03/2016 07/22/16   Adonis BrookAgustin, Sheila, NP  traZODone (DESYREL) 100 MG tablet Take 1 tablet (100 mg total) by mouth at bedtime. Patient not taking: Reported on 10/03/2016 07/22/16   Adonis BrookAgustin, Sheila, NP    Family History Family History  Problem Relation Age of Onset  . Cancer Mother   . Depression Mother   . Depression Maternal Aunt     Social History Social History   Tobacco Use  . Smoking status: Smoker, Current Status Unknown    Packs/day: 0.50    Years: 14.00    Pack years: 7.00    Types: Cigarettes    Last attempt to quit: 01/18/2016    Years since quitting: 1.3  . Smokeless tobacco: Never Used  Substance Use Topics  . Alcohol use: Yes    Comment: occ  . Drug use: No  Allergies   Bee venom and Poison oak extract [poison oak extract]   Review of Systems Review of Systems  Constitutional: Negative for chills and fever.  HENT: Negative for sore throat.   Respiratory: Negative for cough.   Gastrointestinal: Negative for abdominal pain, diarrhea, nausea and vomiting.  Genitourinary: Positive for discharge and dysuria. Negative for decreased urine volume, flank pain, genital sores, penile pain, penile swelling, scrotal swelling and testicular pain.  Musculoskeletal: Negative for back pain.  Neurological: Negative for headaches.  Hematological: Negative for adenopathy.     Physical  Exam Updated Vital Signs BP 136/90 (BP Location: Left Arm)   Pulse 80   Temp 98 F (36.7 C) (Oral)   Resp 16   Ht 5\' 8"  (1.727 m)   Wt 93 kg (205 lb)   SpO2 98%   BMI 31.17 kg/m   Physical Exam  Constitutional: He is oriented to person, place, and time. He appears well-developed and well-nourished.  HENT:  Mouth/Throat: Oropharynx is clear and moist.  Cardiovascular: Normal rate, regular rhythm and intact distal pulses.  No murmur heard. Pulmonary/Chest: Effort normal and breath sounds normal. No respiratory distress.  Abdominal: Soft. He exhibits no distension. There is no tenderness. There is no guarding.  Genitourinary: Testes normal. Cremasteric reflex is present. Right testis shows no mass, no swelling and no tenderness. Left testis shows no mass, no swelling and no tenderness. Circumcised. No phimosis, paraphimosis, penile erythema or penile tenderness. Discharge found.  Genitourinary Comments: Exam chaperoned by nursing staff.  Clear discharge present.  No inguinal masses or tenderness.  No tenderness or edema of the testicles  Musculoskeletal: Normal range of motion.  Neurological: He is alert and oriented to person, place, and time.  Skin: Skin is warm. No rash noted.  Nursing note and vitals reviewed.    ED Treatments / Results  Labs (all labs ordered are listed, but only abnormal results are displayed) Labs Reviewed  URINALYSIS, ROUTINE W REFLEX MICROSCOPIC  GC/CHLAMYDIA PROBE AMP ([place]) NOT AT [facility]    EKG  EKG Interpretation None       Radiology No results found.  Procedures Procedures (including critical care time)  Medications Ordered in ED Medications  cefTRIAXone (ROCEPHIN) injection 250 mg (250 mg Intramuscular Given 05/12/17 1319)  azithromycin (ZITHROMAX) tablet 1,000 mg (1,000 mg Oral Given 05/12/17 1318)  lidocaine (PF) (XYLOCAINE) 1 % injection (0.9 mLs  Given 05/12/17 1319)     Initial Impression / Assessment and Plan / ED  Course  I have reviewed the triage vital signs and the nursing notes.  Pertinent labs & imaging results that were available during my care of the patient were reviewed by me and considered in my medical decision making (see chart for details).     Patient with recurrent ED visits for concerns of STDs.  Since patient admits to recent unprotected exposure, I will treat with Rocephin and Zithromax.  Cultures are pending.  Final Clinical Impressions(s) / ED Diagnoses   Final diagnoses:  Urethritis    ED Discharge Orders    None       Pauline Ausriplett, Crissie Aloi, PA-C 05/14/17 0819    Donnetta Hutchingook, Brian, MD 05/15/17 [phone]

## 2017-05-15 LAB — GC/CHLAMYDIA PROBE AMP (~~LOC~~) NOT AT ARMC
CHLAMYDIA, DNA PROBE: NEGATIVE
NEISSERIA GONORRHEA: NEGATIVE

## 2017-05-20 ENCOUNTER — Other Ambulatory Visit: Payer: Self-pay

## 2017-05-20 ENCOUNTER — Encounter (HOSPITAL_COMMUNITY): Payer: Self-pay | Admitting: Emergency Medicine

## 2017-05-20 ENCOUNTER — Emergency Department (HOSPITAL_COMMUNITY)
Admission: EM | Admit: 2017-05-20 | Discharge: 2017-05-20 | Disposition: A | Discharge status: Home / Self Care | Payer: Self-pay | Attending: Emergency Medicine | Admitting: Emergency Medicine

## 2017-05-20 DIAGNOSIS — I10 Essential (primary) hypertension: Secondary | ICD-10-CM | POA: Insufficient documentation

## 2017-05-20 DIAGNOSIS — F17210 Nicotine dependence, cigarettes, uncomplicated: Secondary | ICD-10-CM | POA: Insufficient documentation

## 2017-05-20 DIAGNOSIS — Z79899 Other long term (current) drug therapy: Secondary | ICD-10-CM | POA: Insufficient documentation

## 2017-05-20 DIAGNOSIS — R369 Urethral discharge, unspecified: Secondary | ICD-10-CM | POA: Insufficient documentation

## 2017-05-20 LAB — URINALYSIS, ROUTINE W REFLEX MICROSCOPIC
Bilirubin Urine: NEGATIVE
Glucose, UA: NEGATIVE mg/dL
Ketones, ur: NEGATIVE mg/dL
Nitrite: NEGATIVE
Protein, ur: NEGATIVE mg/dL
Specific Gravity, Urine: 1.024 (ref 1.005–1.030)
pH: 5 (ref 5.0–8.0)

## 2017-05-20 NOTE — ED Provider Notes (Signed)
[facility] Provider Note   CSN: [id number] Arrival date & time: 05/20/17  0940     History   Chief Complaint Chief Complaint  Patient presents with  . SEXUALLY TRANSMITTED DISEASE    HPI Philip Gonzalez is a 51 y.o. male.  HPI   51 year old male presents today with complaints of penile discharge.  Patient reports symptoms started approximately 2 weeks ago with white discharge.  Patient reports he was seen in the emergency room on 05/12/2017, tested and prophylactically treated.  Patient notes he is continued to have white discharge.  He denies any associated fever, abdominal pain, nausea, vomiting, testicular pain, perineal pain or rectal pain. No difficulty.   Patient notes his last sexual partner was in November of this year, this was a male partner, anal penetration, no reception.  Past Medical History:  Diagnosis Date  . Acid reflux   . Hemorrhoid   . Hypertension   . PTSD (post-traumatic stress disorder)   . STD (male)     Patient Active Problem List   Diagnosis Date Noted  . MDD (major depressive disorder), single episode, severe with psychosis (HCC) 07/15/2016  . MDMA abuse (HCC) 07/15/2016  . Cannabis use disorder, severe, dependence (HCC) 07/15/2016  . Alcohol use disorder, moderate, dependence (HCC) 07/15/2016  . Tobacco use disorder 07/15/2016  . Benzodiazepine abuse (HCC) 07/15/2016  . Chronic back pain 07/15/2016  . Essential hypertension 07/15/2016  . PTSD (post-traumatic stress disorder) 07/14/2016    History reviewed. No pertinent surgical history.    Home Medications    Prior to Admission medications   Medication Sig Start Date End Date Taking? Authorizing Provider  benztropine (COGENTIN) 0.5 MG tablet Take 1 tablet (0.5 mg total) by mouth at bedtime. Patient not taking: Reported on 10/03/2016 07/22/16   Adonis BrookAgustin, Sheila, NP  citalopram (CELEXA) 20 MG tablet Take 1 tablet (20 mg total) by mouth daily. Patient not taking:  Reported on 10/03/2016 07/23/16   Adonis BrookAgustin, Sheila, NP  divalproex (DEPAKOTE) 500 MG DR tablet Take 1 tablet (500 mg total) by mouth every 12 (twelve) hours. Patient not taking: Reported on 10/03/2016 07/22/16   Adonis BrookAgustin, Sheila, NP  hydrochlorothiazide (MICROZIDE) 12.5 MG capsule Take 1 capsule (12.5 mg total) by mouth daily. 07/23/16   Adonis BrookAgustin, Sheila, NP  omeprazole (PRILOSEC) 20 MG capsule Take 20 mg by mouth 2 (two) times daily before a meal.    [provider]  risperiDONE (RISPERDAL) 0.5 MG tablet Take 1 tablet (0.5 mg total) by mouth 2 (two) times daily. Patient not taking: Reported on 10/03/2016 07/22/16   Adonis BrookAgustin, Sheila, NP  risperiDONE (RISPERDAL) 1 MG tablet Take 1 tablet (1 mg total) by mouth at bedtime. Patient not taking: Reported on 10/03/2016 07/22/16   Adonis BrookAgustin, Sheila, NP  traZODone (DESYREL) 100 MG tablet Take 1 tablet (100 mg total) by mouth at bedtime. Patient not taking: Reported on 10/03/2016 07/22/16   Adonis BrookAgustin, Sheila, NP    Family History Family History  Problem Relation Age of Onset  . Cancer Mother   . Depression Mother   . Depression Maternal Aunt     Social History Social History   Tobacco Use  . Smoking status: Smoker, Current Status Unknown    Packs/day: 0.50    Years: 14.00    Pack years: 7.00    Types: Cigarettes    Last attempt to quit: 01/18/2016    Years since quitting: 1.3  . Smokeless tobacco: Never Used  Substance Use Topics  . Alcohol  use: Yes    Comment: occ  . Drug use: No     Allergies   Bee venom and Poison oak extract [poison oak extract]   Review of Systems Review of Systems  All other systems reviewed and are negative.  Physical Exam Updated Vital Signs BP (!) 139/95 (BP Location: Right Arm)   Pulse 76   Temp 97.9 F (36.6 C) (Oral)   Resp 18   Ht 5\' 8"  (1.727 m)   Wt 93 kg (205 lb)   SpO2 93%   BMI 31.17 kg/m   Physical Exam  Constitutional: He is oriented to person, place, and time. He appears well-developed and  well-nourished.  HENT:  Head: Normocephalic and atraumatic.  Eyes: Conjunctivae are normal. Pupils are equal, round, and reactive to light. Right eye exhibits no discharge. Left eye exhibits no discharge. No scleral icterus.  Neck: Normal range of motion. No JVD present. No tracheal deviation present.  Pulmonary/Chest: Effort normal. No stridor.  Genitourinary:  Genitourinary Comments: circumsized penis- no rash- no swelling redness, discharge  Bilateral testicles NTTP  Neurological: He is alert and oriented to person, place, and time. Coordination normal.  Psychiatric: He has a normal mood and affect. His behavior is normal. Judgment and thought content normal.  Nursing note and vitals reviewed.    ED Treatments / Results  Labs (all labs ordered are listed, but only abnormal results are displayed) Labs Reviewed  URINALYSIS, ROUTINE W REFLEX MICROSCOPIC - Abnormal; Notable for the following components:      Result Value   Hgb urine dipstick SMALL (*)    Leukocytes, UA TRACE (*)    Bacteria, UA RARE (*)    Squamous Epithelial / LPF 0-5 (*)    All other components within normal limits  GC/CHLAMYDIA PROBE AMP ([place]) NOT AT [facility]    EKG  EKG Interpretation None       Radiology No results found.  Procedures Procedures (including critical care time)  Medications Ordered in ED Medications - No data to display   Initial Impression / Assessment and Plan / ED Course  I have reviewed the triage vital signs and the nursing notes.  Pertinent labs & imaging results that were available during my care of the patient were reviewed by me and considered in my medical decision making (see chart for details).     Final Clinical Impressions(s) / ED Diagnoses   Final diagnoses:  Penile discharge    Labs:   Imaging:  Consults:  Therapeutics:  Discharge Meds:   Assessment/Plan: 51 year old male presents today with complaints of penile discharge.  He was seen last  week for the same.  He had a workup showing no infectious etiology and was treated.  Patient reports discharge, no discharge noted on my exam, he had repeat testing done here today.  Patient is denying any sort of rectal perineal testicular or penile pain.  She will be encouraged follow-up with the health department, follow-up with primary care, return with any new or worsening signs or symptoms.  Patient verbalized understanding and agreement to today's plan had no further questions or concerns.    ED Discharge Orders    None       Rosalio LoudHedges, Eddy Liszewski, PA-C 05/20/17 1217    Eber HongMiller, Brian, MD 05/21/17 [phone]

## 2017-05-20 NOTE — ED Triage Notes (Signed)
Pt had unprotected sex last month and was treated last Friday here.  Pt presents with white discharge, trouble urinating, and itching. VSS

## 2017-05-20 NOTE — Discharge Instructions (Signed)
Please read attached information. If you experience any new or worsening signs or symptoms please return to the emergency room for evaluation. Please follow-up with your primary care provider or specialist as discussed.  °

## 2017-05-22 LAB — GC/CHLAMYDIA PROBE AMP (~~LOC~~) NOT AT ARMC
Chlamydia: NEGATIVE
Neisseria Gonorrhea: NEGATIVE

## 2018-01-30 ENCOUNTER — Encounter (HOSPITAL_COMMUNITY): Payer: Self-pay | Admitting: Emergency Medicine

## 2018-01-30 ENCOUNTER — Emergency Department (HOSPITAL_COMMUNITY)
Admission: EM | Admit: 2018-01-30 | Discharge: 2018-01-30 | Disposition: A | Discharge status: Home / Self Care | Payer: Self-pay | Attending: Emergency Medicine | Admitting: Emergency Medicine

## 2018-01-30 ENCOUNTER — Other Ambulatory Visit: Payer: Self-pay

## 2018-01-30 DIAGNOSIS — H6690 Otitis media, unspecified, unspecified ear: Secondary | ICD-10-CM

## 2018-01-30 DIAGNOSIS — Z87891 Personal history of nicotine dependence: Secondary | ICD-10-CM | POA: Insufficient documentation

## 2018-01-30 DIAGNOSIS — H6691 Otitis media, unspecified, right ear: Secondary | ICD-10-CM | POA: Insufficient documentation

## 2018-01-30 DIAGNOSIS — I10 Essential (primary) hypertension: Secondary | ICD-10-CM | POA: Insufficient documentation

## 2018-01-30 MED ORDER — AMOXICILLIN 500 MG PO CAPS: 500.0000 mg | ORAL_CAPSULE | Freq: Three times a day (TID) | ORAL | 0 refills | Status: DC

## 2018-01-30 MED ORDER — IBUPROFEN 600 MG PO TABS: 600.0000 mg | ORAL_TABLET | Freq: Four times a day (QID) | ORAL | 0 refills | Status: DC | PRN

## 2018-01-30 NOTE — ED Provider Notes (Signed)
[facility] EMERGENCY DEPARTMENT Provider Note   CSN: [id number] Arrival date & time: 01/30/18  1506     History   Chief Complaint Chief Complaint  Patient presents with  . Otalgia    HPI Philip Gonzalez is a 52 y.o. male.  The history is provided by the patient. No language interpreter was used.  Otalgia  This is a new problem. The current episode started yesterday. There is pain in the right ear. There has been no fever. The pain is moderate. Pertinent negatives include no rhinorrhea and no sore throat. His past medical history does not include chronic ear infection.    Past Medical History:  Diagnosis Date  . Acid reflux   . Hemorrhoid   . Hypertension   . PTSD (post-traumatic stress disorder)   . STD (male)     Patient Active Problem List   Diagnosis Date Noted  . MDD (major depressive disorder), single episode, severe with psychosis (HCC) 07/15/2016  . MDMA abuse (HCC) 07/15/2016  . Cannabis use disorder, severe, dependence (HCC) 07/15/2016  . Alcohol use disorder, moderate, dependence (HCC) 07/15/2016  . Tobacco use disorder 07/15/2016  . Benzodiazepine abuse (HCC) 07/15/2016  . Chronic back pain 07/15/2016  . Essential hypertension 07/15/2016  . PTSD (post-traumatic stress disorder) 07/14/2016    History reviewed. No pertinent surgical history.      Home Medications    Prior to Admission medications   Medication Sig Start Date End Date Taking? Authorizing Provider  amoxicillin (AMOXIL) 500 MG capsule Take 1 capsule (500 mg total) by mouth 3 (three) times daily. 01/30/18   Elson Areas, PA-C  hydrochlorothiazide (MICROZIDE) 12.5 MG capsule Take 1 capsule (12.5 mg total) by mouth daily. 07/23/16   Adonis Brook, NP  ibuprofen (ADVIL,MOTRIN) 600 MG tablet Take 1 tablet (600 mg total) by mouth every 6 (six) hours as needed. 01/30/18   Elson Areas, PA-C  omeprazole (PRILOSEC) 20 MG capsule Take 20 mg by mouth 2 (two) times daily before a meal.     [provider]    Family History Family History  Problem Relation Age of Onset  . Cancer Mother   . Depression Mother   . Depression Maternal Aunt     Social History Social History   Tobacco Use  . Smoking status: Smoker, Current Status Unknown    Packs/day: 0.50    Years: 14.00    Pack years: 7.00    Types: Cigarettes    Last attempt to quit: 01/18/2016    Years since quitting: 2.0  . Smokeless tobacco: Never Used  Substance Use Topics  . Alcohol use: Yes    Comment: occ  . Drug use: No     Allergies   Bee venom and Poison oak extract [poison oak extract]   Review of Systems Review of Systems  HENT: Positive for ear pain. Negative for rhinorrhea and sore throat.   All other systems reviewed and are negative.    Physical Exam Updated Vital Signs BP (!) 134/98 (BP Location: Right Arm)   Pulse 92   Temp 98 F (36.7 C) (Oral)   Resp 16   Ht 5\' 7"  (1.702 m)   Wt 88.5 kg   SpO2 97%   BMI 30.54 kg/m   Physical Exam  Constitutional: He appears well-developed and well-nourished.  HENT:  Head: Normocephalic.  Erythema right tm, poor landmarks   Eyes: Pupils are equal, round, and reactive to light. EOM are normal.  Neck: Normal range of  motion.  Cardiovascular: Normal rate.  Pulmonary/Chest: Effort normal.  Abdominal: Soft.  Musculoskeletal: Normal range of motion.  Neurological: He is alert.  Skin: Skin is warm.  Nursing note and vitals reviewed.    ED Treatments / Results  Labs (all labs ordered are listed, but only abnormal results are displayed) Labs Reviewed - No data to display  EKG None  Radiology No results found.  Procedures Procedures (including critical care time)  Medications Ordered in ED Medications - No data to display   Initial Impression / Assessment and Plan / ED Course  I have reviewed the triage vital signs and the nursing notes.  Pertinent labs & imaging results that were available during my care of the  patient were reviewed by me and considered in my medical decision making (see chart for details).    Pt also has soreness to his shoulder,  No swelling,  Full range of motion   Final Clinical Impressions(s) / ED Diagnoses   Final diagnoses:  Acute otitis media, unspecified otitis media type    ED Discharge Orders         Ordered    amoxicillin (AMOXIL) 500 MG capsule  3 times daily     01/30/18 1643    ibuprofen (ADVIL,MOTRIN) 600 MG tablet  Every 6 hours PRN     01/30/18 1643        An After Visit Summary was printed and given to the patient.    Elson Areas, [place] 01/30/18 1651    Maia Plan, MD 01/31/18 [phone]

## 2018-01-30 NOTE — ED Triage Notes (Signed)
Patient complains of left earache x 2 weeks. States occasional headache. Denies fever, nausea, diarrhea.

## 2018-03-12 ENCOUNTER — Emergency Department (HOSPITAL_COMMUNITY)
Admission: EM | Admit: 2018-03-12 | Discharge: 2018-03-12 | Disposition: A | Discharge status: Home / Self Care | Payer: Self-pay | Attending: Emergency Medicine | Admitting: Emergency Medicine

## 2018-03-12 ENCOUNTER — Encounter (HOSPITAL_COMMUNITY): Payer: Self-pay

## 2018-03-12 ENCOUNTER — Other Ambulatory Visit: Payer: Self-pay

## 2018-03-12 DIAGNOSIS — Z79899 Other long term (current) drug therapy: Secondary | ICD-10-CM | POA: Insufficient documentation

## 2018-03-12 DIAGNOSIS — R21 Rash and other nonspecific skin eruption: Secondary | ICD-10-CM | POA: Insufficient documentation

## 2018-03-12 DIAGNOSIS — F17210 Nicotine dependence, cigarettes, uncomplicated: Secondary | ICD-10-CM | POA: Insufficient documentation

## 2018-03-12 DIAGNOSIS — I10 Essential (primary) hypertension: Secondary | ICD-10-CM | POA: Insufficient documentation

## 2018-03-12 MED ORDER — CETIRIZINE HCL 10 MG PO TABS: 10.0000 mg | ORAL_TABLET | Freq: Every day | ORAL | 0 refills | Status: DC

## 2018-03-12 MED ORDER — PREDNISONE 10 MG PO TABS: 40.0000 mg | ORAL_TABLET | Freq: Every day | ORAL | 0 refills | Status: AC

## 2018-03-12 NOTE — ED Provider Notes (Signed)
[facility] EMERGENCY DEPARTMENT Provider Note   CSN: [id number] Arrival date & time: 03/12/18  1027     History   Chief Complaint Chief Complaint  Patient presents with  . Rash    HPI Philip Gonzalez is a 52 y.o. male presenting for evaluation of rash and itching.  Patient presenting for evaluation of rash on his left leg.  Patient thinks the rash has been there for about 2 weeks.  He initially tells me the rash is nowhere else, but then states he had a spot of itching on his back.  Patient states currently he is without rash, but it comes and goes.  Initially he tells me this is not happened before, but then he states several years ago it did happen, was improved with prednisone.  He does not know what caused it at that time.  Rash is not worse with heat.  When it is present, it itches.  He has been using hydrocortisone cream without improvement.  Patient states he is itched until he has caused a bruise on his thigh.  He takes acyclovir and lysine, is on no other medications.  His primary care is with the [place], but he has not been there in "a while."   HPI  Past Medical History:  Diagnosis Date  . Acid reflux   . Hemorrhoid   . Hypertension   . PTSD (post-traumatic stress disorder)   . STD (male)     Patient Active Problem List   Diagnosis Date Noted  . MDD (major depressive disorder), single episode, severe with psychosis (HCC) 07/15/2016  . MDMA abuse (HCC) 07/15/2016  . Cannabis use disorder, severe, dependence (HCC) 07/15/2016  . Alcohol use disorder, moderate, dependence (HCC) 07/15/2016  . Tobacco use disorder 07/15/2016  . Benzodiazepine abuse (HCC) 07/15/2016  . Chronic back pain 07/15/2016  . Essential hypertension 07/15/2016  . PTSD (post-traumatic stress disorder) 07/14/2016    History reviewed. No pertinent surgical history.      Home Medications    Prior to Admission medications   Medication Sig Start Date End Date Taking? Authorizing Provider    amoxicillin (AMOXIL) 500 MG capsule Take 1 capsule (500 mg total) by mouth 3 (three) times daily. 01/30/18   Elson Areas, PA-C  cetirizine (ZYRTEC) 10 MG tablet Take 1 tablet (10 mg total) by mouth daily. 03/12/18   Mayrani Khamis, PA-C  hydrochlorothiazide (MICROZIDE) 12.5 MG capsule Take 1 capsule (12.5 mg total) by mouth daily. 07/23/16   Adonis Brook, NP  ibuprofen (ADVIL,MOTRIN) 600 MG tablet Take 1 tablet (600 mg total) by mouth every 6 (six) hours as needed. 01/30/18   Elson Areas, PA-C  omeprazole (PRILOSEC) 20 MG capsule Take 20 mg by mouth 2 (two) times daily before a meal.    [provider]  predniSONE (DELTASONE) 10 MG tablet Take 4 tablets (40 mg total) by mouth daily for 4 days. 03/12/18 03/16/18  Jafar Poffenberger, PA-C    Family History Family History  Problem Relation Age of Onset  . Cancer Mother   . Depression Mother   . Depression Maternal Aunt     Social History Social History   Tobacco Use  . Smoking status: Smoker, Current Status Unknown    Packs/day: 0.50    Years: 14.00    Pack years: 7.00    Types: Cigarettes    Last attempt to quit: 01/18/2016    Years since quitting: 2.1  . Smokeless tobacco: Never Used  Substance Use Topics  .  Alcohol use: Yes    Comment: occ  . Drug use: No     Allergies   Bee venom and Poison oak extract [poison oak extract]   Review of Systems Review of Systems  Constitutional: Negative for fever.  Skin: Positive for rash (not present currently).     Physical Exam Updated Vital Signs BP 138/89 (BP Location: Right Arm)   Pulse 79   Temp 98.9 F (37.2 C) (Oral)   Resp 16   Ht 5\' 7"  (1.702 m)   Wt 88.5 kg   SpO2 99%   BMI 30.54 kg/m   Physical Exam  Constitutional: He is oriented to person, place, and time. He appears well-developed and well-nourished. No distress.  In no distress  HENT:  Head: Normocephalic and atraumatic.  Eyes: EOM are normal.  Neck: Normal range of motion.   Pulmonary/Chest: Effort normal.  Abdominal: He exhibits no distension.  Musculoskeletal: Normal range of motion.  Neurological: He is alert and oriented to person, place, and time.  Skin: Skin is warm. Capillary refill takes less than 2 seconds. No rash noted.  No rash present.  No areas of dry skin.  Small, healing contusion of the left medial thigh.  No lesions noted elsewhere.  No rash noted on the back.  Psychiatric: He has a normal mood and affect.  Nursing note and vitals reviewed.    ED Treatments / Results  Labs (all labs ordered are listed, but only abnormal results are displayed) Labs Reviewed - No data to display  EKG None  Radiology No results found.  Procedures Procedures (including critical care time)  Medications Ordered in ED Medications - No data to display   Initial Impression / Assessment and Plan / ED Course  I have reviewed the triage vital signs and the nursing notes.  Pertinent labs & imaging results that were available during my care of the patient were reviewed by me and considered in my medical decision making (see chart for details).     Patient presenting for evaluation of rash.  Patient currently without rash.  Physical exam reassuring, he is afebrile not tachycardic.  No lesions noted on the skin.  He has a small contusion, likely from retching.  Discussed with patient that as rash is intermittent and not present currently, it is difficult to diagnose.  Low suspicion for arterial or fungal infection, as this would likely not come and go.  Patient is requesting prednisone.  I am hesitant to give this, as he currently has no rash. I had long discussion with him about this.  Will trial cetirizine first, if this helps he will not fill the prednisone.  If cetirizine fails, he will try prednisone for itching control.  Encouraged follow-up with PCP and/or dermatology for further evaluation of his symptoms.  At this time, patient appears safe for discharge.   Return precautions given.  Patient states he understands agrees plan.  Final Clinical Impressions(s) / ED Diagnoses   Final diagnoses:  Rash and nonspecific skin eruption    ED Discharge Orders         Ordered    cetirizine (ZYRTEC) 10 MG tablet  Daily     03/12/18 1209    predniSONE (DELTASONE) 10 MG tablet  Daily     03/12/18 1209           Alveria Apley, PA-C 03/12/18 1237    Benjiman Core, MD 03/12/18 1536

## 2018-03-12 NOTE — Discharge Instructions (Signed)
Your rash was not present on exam today, so it is difficult to determine what is causing it. Use cetirizine daily for itching.  If this resolves the issue, you do not need any further medication. If cetirizine is not working, stop taking it and start taking prednisone as prescribed.  You do not need to take prednisone unless cetirizine does not help. Make sure skin is staying well-hydrated with lotion. Follow-up with either your primary care doctor or dermatology as listed below. Return to the emergency room with any new, worsening, or concerning symptoms.

## 2018-03-12 NOTE — ED Triage Notes (Signed)
Pt reports rash and itching to upper left thigh for one month. Noted bruised area from scratching. No obvious rash/redness noted. Pt reports using hydrocortisone

## 2018-04-18 ENCOUNTER — Emergency Department (HOSPITAL_COMMUNITY)
Admission: EM | Admit: 2018-04-18 | Discharge: 2018-04-18 | Disposition: A | Discharge status: Home / Self Care | Payer: Self-pay | Attending: Emergency Medicine | Admitting: Emergency Medicine

## 2018-04-18 ENCOUNTER — Emergency Department (HOSPITAL_COMMUNITY): Payer: Self-pay

## 2018-04-18 ENCOUNTER — Encounter (HOSPITAL_COMMUNITY): Payer: Self-pay

## 2018-04-18 ENCOUNTER — Other Ambulatory Visit: Payer: Self-pay

## 2018-04-18 DIAGNOSIS — J40 Bronchitis, not specified as acute or chronic: Secondary | ICD-10-CM | POA: Insufficient documentation

## 2018-04-18 DIAGNOSIS — I10 Essential (primary) hypertension: Secondary | ICD-10-CM | POA: Insufficient documentation

## 2018-04-18 DIAGNOSIS — F17210 Nicotine dependence, cigarettes, uncomplicated: Secondary | ICD-10-CM | POA: Insufficient documentation

## 2018-04-18 MED ORDER — DOXYCYCLINE HYCLATE 100 MG PO CAPS: 100.0000 mg | ORAL_CAPSULE | Freq: Two times a day (BID) | ORAL | 0 refills | Status: DC

## 2018-04-18 NOTE — ED Triage Notes (Signed)
Pt states he thinks he has the flu. Has been coughing with productive sputum as well as some blood for the past 2 weeks. Is also complaining of a fever and occasional cold chills and sweats. NAD.

## 2018-04-18 NOTE — Discharge Instructions (Addendum)
Follow-up with your doctor if not improving 

## 2018-04-18 NOTE — ED Provider Notes (Signed)
[facility] EMERGENCY DEPARTMENT Provider Note   CSN: [id number] Arrival date & time: 04/18/18  1004     History   Chief Complaint Chief Complaint  Patient presents with  . Cough    HPI Philip Gonzalez is a 52 y.o. male.  Patient complains of a cough for about 2 weeks.  And now is coughing up some small spots of blood.  No fever chills no vomiting  The history is provided by the patient. No language interpreter was used.  Cough  This is a new problem. The current episode started more than 1 week ago. The problem occurs constantly. The problem has not changed since onset.The cough is productive of brown sputum and productive of blood-tinged sputum. Pertinent negatives include no chest pain and no headaches. He has tried nothing for the symptoms. Risk factors: Unknown. He is a smoker.    Past Medical History:  Diagnosis Date  . Acid reflux   . Hemorrhoid   . Hypertension   . PTSD (post-traumatic stress disorder)   . STD (male)     Patient Active Problem List   Diagnosis Date Noted  . MDD (major depressive disorder), single episode, severe with psychosis (HCC) 07/15/2016  . MDMA abuse (HCC) 07/15/2016  . Cannabis use disorder, severe, dependence (HCC) 07/15/2016  . Alcohol use disorder, moderate, dependence (HCC) 07/15/2016  . Tobacco use disorder 07/15/2016  . Benzodiazepine abuse (HCC) 07/15/2016  . Chronic back pain 07/15/2016  . Essential hypertension 07/15/2016  . PTSD (post-traumatic stress disorder) 07/14/2016    History reviewed. No pertinent surgical history.      Home Medications    Prior to Admission medications   Medication Sig Start Date End Date Taking? Authorizing Provider  cetirizine (ZYRTEC) 10 MG tablet Take 1 tablet (10 mg total) by mouth daily. 03/12/18  Yes Caccavale, Sophia, PA-C  hydrochlorothiazide (MICROZIDE) 12.5 MG capsule Take 1 capsule (12.5 mg total) by mouth daily. 07/23/16  Yes Adonis BrookAgustin, Sheila, NP  ibuprofen (ADVIL,MOTRIN) 600 MG  tablet Take 1 tablet (600 mg total) by mouth every 6 (six) hours as needed. 01/30/18  Yes Cheron SchaumannSofia, Leslie K, PA-C  doxycycline (VIBRAMYCIN) 100 MG capsule Take 1 capsule (100 mg total) by mouth 2 (two) times daily. One po bid x 7 days 04/18/18   Bethann BerkshireZammit, Alajia Schmelzer, MD  omeprazole (PRILOSEC) 20 MG capsule Take 20 mg by mouth 2 (two) times daily before a meal.    [provider]    Family History Family History  Problem Relation Age of Onset  . Cancer Mother   . Depression Mother   . Depression Maternal Aunt     Social History Social History   Tobacco Use  . Smoking status: Smoker, Current Status Unknown    Packs/day: 0.50    Years: 14.00    Pack years: 7.00    Types: Cigarettes    Last attempt to quit: 01/18/2016    Years since quitting: 2.2  . Smokeless tobacco: Never Used  Substance Use Topics  . Alcohol use: Yes    Comment: occ  . Drug use: No     Allergies   Bee venom and Poison oak extract [poison oak extract]   Review of Systems Review of Systems  Constitutional: Negative for appetite change and fatigue.  HENT: Negative for congestion, ear discharge and sinus pressure.   Eyes: Negative for discharge.  Respiratory: Positive for cough.   Cardiovascular: Negative for chest pain.  Gastrointestinal: Negative for abdominal pain and diarrhea.  Genitourinary: Negative  for frequency and hematuria.  Musculoskeletal: Negative for back pain.  Skin: Negative for rash.  Neurological: Negative for seizures and headaches.  Psychiatric/Behavioral: Negative for hallucinations.     Physical Exam Updated Vital Signs BP 125/90   Pulse 80   Temp 97.8 F (36.6 C) (Oral)   Resp 16   Ht 5\' 7"  (1.702 m)   Wt 88.5 kg   SpO2 95%   BMI 30.54 kg/m   Physical Exam  Constitutional: He is oriented to person, place, and time. He appears well-developed.  HENT:  Head: Normocephalic.  Eyes: Conjunctivae and EOM are normal. No scleral icterus.  Neck: Neck supple. No thyromegaly  present.  Cardiovascular: Normal rate and regular rhythm. Exam reveals no gallop and no friction rub.  No murmur heard. Pulmonary/Chest: No stridor. He has no wheezes. He has no rales. He exhibits no tenderness.  Abdominal: He exhibits no distension. There is no tenderness. There is no rebound.  Musculoskeletal: Normal range of motion. He exhibits no edema.  Lymphadenopathy:    He has no cervical adenopathy.  Neurological: He is oriented to person, place, and time. He exhibits normal muscle tone. Coordination normal.  Skin: No rash noted. No erythema.  Psychiatric: He has a normal mood and affect. His behavior is normal.     ED Treatments / Results  Labs (all labs ordered are listed, but only abnormal results are displayed) Labs Reviewed - No data to display  EKG None  Radiology Dg Chest 2 View  Result Date: 04/18/2018 CLINICAL DATA:  Weakness, flu-like symptoms for 2 weeks EXAM: CHEST - 2 VIEW COMPARISON:  02/08/2013 FINDINGS: Heart and mediastinal contours are within normal limits. No focal opacities or effusions. No acute bony abnormality. IMPRESSION: No active cardiopulmonary disease. Electronically Signed   By: Charlett Nose M.D.   On: 04/18/2018 11:21    Procedures Procedures (including critical care time)  Medications Ordered in ED Medications - No data to display   Initial Impression / Assessment and Plan / ED Course  I have reviewed the triage vital signs and the nursing notes.  Pertinent labs & imaging results that were available during my care of the patient were reviewed by me and considered in my medical decision making (see chart for details).  Clinical Course as of Apr 18 1201  Wed Apr 18, 2018  1147 DG Chest 2 View [CC]    Clinical Course User Index [CC] [place], Vesta Mix[place], [place]    X-ray unremarkable.  Patient has bronchitis with hemoptysis.  He will be placed on doxycycline and will follow-up with PCP  Final Clinical Impressions(s) / ED  Diagnoses   Final diagnoses:  Bronchitis    ED Discharge Orders         Ordered    doxycycline (VIBRAMYCIN) 100 MG capsule  2 times daily     04/18/18 1202           Bethann Berkshire, MD 04/18/18 1203

## 2018-06-05 ENCOUNTER — Emergency Department (HOSPITAL_COMMUNITY)
Admission: EM | Admit: 2018-06-05 | Discharge: 2018-06-05 | Disposition: A | Discharge status: Home / Self Care | Payer: Self-pay | Attending: Emergency Medicine | Admitting: Emergency Medicine

## 2018-06-05 ENCOUNTER — Other Ambulatory Visit: Payer: Self-pay

## 2018-06-05 ENCOUNTER — Encounter (HOSPITAL_COMMUNITY): Payer: Self-pay | Admitting: Emergency Medicine

## 2018-06-05 DIAGNOSIS — F17210 Nicotine dependence, cigarettes, uncomplicated: Secondary | ICD-10-CM | POA: Insufficient documentation

## 2018-06-05 DIAGNOSIS — R369 Urethral discharge, unspecified: Secondary | ICD-10-CM | POA: Insufficient documentation

## 2018-06-05 DIAGNOSIS — Z79899 Other long term (current) drug therapy: Secondary | ICD-10-CM | POA: Insufficient documentation

## 2018-06-05 DIAGNOSIS — I10 Essential (primary) hypertension: Secondary | ICD-10-CM | POA: Insufficient documentation

## 2018-06-05 LAB — URINALYSIS, ROUTINE W REFLEX MICROSCOPIC
BILIRUBIN URINE: NEGATIVE
Glucose, UA: NEGATIVE mg/dL
HGB URINE DIPSTICK: NEGATIVE
Ketones, ur: NEGATIVE mg/dL
Nitrite: NEGATIVE
PROTEIN: NEGATIVE mg/dL
SPECIFIC GRAVITY, URINE: 1.023 (ref 1.005–1.030)
pH: 7 (ref 5.0–8.0)

## 2018-06-05 MED ORDER — LIDOCAINE HCL (PF) 1 % IJ SOLN
INTRAMUSCULAR | Status: AC
  Administered 2018-06-05: 11:00:00
  Filled 2018-06-05: qty 2

## 2018-06-05 MED ORDER — AZITHROMYCIN 250 MG PO TABS
1000.0000 mg | ORAL_TABLET | Freq: Once | ORAL | Status: AC
  Administered 2018-06-05: 1000 mg via ORAL
  Filled 2018-06-05: qty 4

## 2018-06-05 MED ORDER — CEFTRIAXONE SODIUM 250 MG IJ SOLR
250.0000 mg | Freq: Once | INTRAMUSCULAR | Status: AC
  Administered 2018-06-05: 250 mg via INTRAMUSCULAR
  Filled 2018-06-05: qty 250

## 2018-06-05 NOTE — ED Triage Notes (Signed)
Patient complains of discharge from penis x 2 days. States last time he had intercourse was 2 weeks ago with one partner. Patient also complains of rash. NAD

## 2018-06-05 NOTE — Discharge Instructions (Signed)
You were treated in the emergency department for urethritis with intramuscular Rocephin and oral Zithromax.  Cultures were sent to the lab.  Blood tests for syphilis were sent to the lab as well.  Please restrain from all sexual activity over the next 7 days.  If there are any abnormalities that were not addressed today, someone will call you from the [facility] flow managers office with instructions.  Please follow-up with the [facility] if not improving.

## 2018-06-05 NOTE — ED Provider Notes (Signed)
[facility] EMERGENCY DEPARTMENT Provider Note   CSN: [id number] Arrival date & time: 06/05/18  0908     History   Chief Complaint Chief Complaint  Patient presents with  . Exposure to STD    HPI Philip Gonzalez is a 53 y.o. male.  The history is provided by the patient.  Exposure to STD  This is a new problem. The current episode started 2 days ago. The problem occurs constantly. The problem has not changed since onset.Pertinent negatives include no chest pain, no abdominal pain, no headaches and no shortness of breath. Nothing aggravates the symptoms. Nothing relieves the symptoms. He has tried nothing for the symptoms.    Past Medical History:  Diagnosis Date  . Acid reflux   . Hemorrhoid   . Hypertension   . PTSD (post-traumatic stress disorder)   . STD (male)     Patient Active Problem List   Diagnosis Date Noted  . MDD (major depressive disorder), single episode, severe with psychosis (HCC) 07/15/2016  . MDMA abuse (HCC) 07/15/2016  . Cannabis use disorder, severe, dependence (HCC) 07/15/2016  . Alcohol use disorder, moderate, dependence (HCC) 07/15/2016  . Tobacco use disorder 07/15/2016  . Benzodiazepine abuse (HCC) 07/15/2016  . Chronic back pain 07/15/2016  . Essential hypertension 07/15/2016  . PTSD (post-traumatic stress disorder) 07/14/2016    History reviewed. No pertinent surgical history.      Home Medications    Prior to Admission medications   Medication Sig Start Date End Date Taking? Authorizing Provider  hydrochlorothiazide (MICROZIDE) 12.5 MG capsule Take 1 capsule (12.5 mg total) by mouth daily. 07/23/16  Yes Adonis Brook, NP  cetirizine (ZYRTEC) 10 MG tablet Take 1 tablet (10 mg total) by mouth daily. Patient not taking: Reported on 06/05/2018 03/12/18   Caccavale, Sophia, PA-C  doxycycline (VIBRAMYCIN) 100 MG capsule Take 1 capsule (100 mg total) by mouth 2 (two) times daily. One po bid x 7 days Patient not taking: Reported on  06/05/2018 04/18/18   Bethann Berkshire, MD  ibuprofen (ADVIL,MOTRIN) 600 MG tablet Take 1 tablet (600 mg total) by mouth every 6 (six) hours as needed. Patient not taking: Reported on 06/05/2018 01/30/18   Elson Areas, PA-C    Family History Family History  Problem Relation Age of Onset  . Cancer Mother   . Depression Mother   . Depression Maternal Aunt     Social History Social History   Tobacco Use  . Smoking status: Smoker, Current Status Unknown    Packs/day: 0.50    Years: 14.00    Pack years: 7.00    Types: Cigarettes    Last attempt to quit: 01/18/2016    Years since quitting: 2.3  . Smokeless tobacco: Never Used  Substance Use Topics  . Alcohol use: Yes    Comment: occ  . Drug use: No     Allergies   Bee venom and Poison oak extract [poison oak extract]   Review of Systems Review of Systems  Constitutional: Negative for activity change.       All ROS Neg except as noted in HPI  HENT: Negative for nosebleeds.   Eyes: Negative for photophobia and discharge.  Respiratory: Negative for cough, shortness of breath and wheezing.   Cardiovascular: Negative for chest pain and palpitations.  Gastrointestinal: Negative for abdominal pain and blood in stool.  Genitourinary: Positive for discharge, dysuria and hematuria. Negative for difficulty urinating, frequency and testicular pain.  Musculoskeletal: Negative for arthralgias, back pain and neck  pain.  Skin: Negative.   Neurological: Negative for dizziness, seizures, speech difficulty and headaches.  Psychiatric/Behavioral: Negative for confusion and hallucinations.     Physical Exam Updated Vital Signs BP (!) 136/97 (BP Location: Left Arm)   Pulse 82   Temp 97.8 F (36.6 C) (Oral)   Resp 16   Ht 5\' 7"  (1.702 m)   Wt 83.9 kg   SpO2 98%   BMI 28.98 kg/m   Physical Exam Vitals signs and nursing note reviewed.  Constitutional:      Appearance: He is well-developed. He is not toxic-appearing.  HENT:      Head: Normocephalic.     Comments: Nasal congestion present.    Right Ear: Tympanic membrane and external ear normal.     Left Ear: Tympanic membrane and external ear normal.  Eyes:     General: Lids are normal.     Pupils: Pupils are equal, round, and reactive to light.  Neck:     Musculoskeletal: Normal range of motion and neck supple.     Vascular: No carotid bruit.  Cardiovascular:     Rate and Rhythm: Normal rate and regular rhythm.     Pulses: Normal pulses.     Heart sounds: Normal heart sounds.  Pulmonary:     Effort: No respiratory distress.     Breath sounds: Normal breath sounds.  Abdominal:     General: Bowel sounds are normal.     Palpations: Abdomen is soft.     Tenderness: There is no abdominal tenderness. There is no guarding.     Hernia: There is no hernia in the right inguinal area.  Genitourinary:    Pubic Area: No rash.      Penis: Uncircumcised. Discharge present. No swelling.      Scrotum/Testes: Normal.     Epididymis:     Right: Normal.     Left: Normal.  Musculoskeletal: Normal range of motion.  Lymphadenopathy:     Head:     Right side of head: No submandibular adenopathy.     Left side of head: No submandibular adenopathy.     Cervical: No cervical adenopathy.     Lower Body: No left inguinal adenopathy.  Skin:    General: Skin is warm and dry.  Neurological:     Mental Status: He is alert and oriented to person, place, and time.     Cranial Nerves: No cranial nerve deficit.     Sensory: No sensory deficit.  Psychiatric:        Speech: Speech normal.      ED Treatments / Results  Labs (all labs ordered are listed, but only abnormal results are displayed) Labs Reviewed - No data to display  EKG None  Radiology No results found.  Procedures Procedures (including critical care time)  Medications Ordered in ED Medications - No data to display   Initial Impression / Assessment and Plan / ED Course  I have reviewed the triage  vital signs and the nursing notes.  Pertinent labs & imaging results that were available during my care of the patient were reviewed by me and considered in my medical decision making (see chart for details).       Final Clinical Impressions(s) / ED Diagnoses MDM  Blood pressure is elevated at 136/97.  Have asked the patient have this rechecked soon.  Vital signs are otherwise within normal limits.  Exam is consistent with urethritis. Pt treated in the ED with Rocephin and zithromax. Cultures ,  HIV and RPR pending.   Final diagnoses:  Penile discharge    ED Discharge Orders    None       Ivery QualeBryant, Meighan Treto, PA-C 06/05/18 2312    Doug SouJacubowitz, Sam, MD 06/06/18 [phone]

## 2018-06-06 LAB — GC/CHLAMYDIA PROBE AMP (~~LOC~~) NOT AT ARMC
Chlamydia: NEGATIVE
NEISSERIA GONORRHEA: NEGATIVE

## 2018-06-06 LAB — HIV ANTIBODY (ROUTINE TESTING W REFLEX): HIV Screen 4th Generation wRfx: NONREACTIVE

## 2018-06-06 LAB — RPR: RPR: NONREACTIVE

## 2018-07-31 ENCOUNTER — Encounter (HOSPITAL_COMMUNITY): Payer: Self-pay | Admitting: *Deleted

## 2018-07-31 ENCOUNTER — Other Ambulatory Visit: Payer: Self-pay

## 2018-07-31 ENCOUNTER — Emergency Department (HOSPITAL_COMMUNITY)
Admission: EM | Admit: 2018-07-31 | Discharge: 2018-07-31 | Disposition: A | Discharge status: Home / Self Care | Payer: No Typology Code available for payment source | Attending: Emergency Medicine | Admitting: Emergency Medicine

## 2018-07-31 ENCOUNTER — Emergency Department (HOSPITAL_COMMUNITY): Payer: No Typology Code available for payment source

## 2018-07-31 DIAGNOSIS — J40 Bronchitis, not specified as acute or chronic: Secondary | ICD-10-CM | POA: Diagnosis not present

## 2018-07-31 DIAGNOSIS — Z79899 Other long term (current) drug therapy: Secondary | ICD-10-CM | POA: Insufficient documentation

## 2018-07-31 DIAGNOSIS — F1220 Cannabis dependence, uncomplicated: Secondary | ICD-10-CM | POA: Insufficient documentation

## 2018-07-31 DIAGNOSIS — F17210 Nicotine dependence, cigarettes, uncomplicated: Secondary | ICD-10-CM | POA: Insufficient documentation

## 2018-07-31 DIAGNOSIS — F329 Major depressive disorder, single episode, unspecified: Secondary | ICD-10-CM | POA: Insufficient documentation

## 2018-07-31 DIAGNOSIS — R05 Cough: Secondary | ICD-10-CM | POA: Diagnosis present

## 2018-07-31 DIAGNOSIS — F1020 Alcohol dependence, uncomplicated: Secondary | ICD-10-CM | POA: Insufficient documentation

## 2018-07-31 DIAGNOSIS — I10 Essential (primary) hypertension: Secondary | ICD-10-CM | POA: Diagnosis not present

## 2018-07-31 MED ORDER — DOXYCYCLINE HYCLATE 100 MG PO CAPS: 100.0000 mg | ORAL_CAPSULE | Freq: Two times a day (BID) | ORAL | 0 refills | Status: AC

## 2018-07-31 MED ORDER — IBUPROFEN 600 MG PO TABS: 600.0000 mg | ORAL_TABLET | Freq: Four times a day (QID) | ORAL | 0 refills | Status: AC | PRN

## 2018-07-31 NOTE — Discharge Instructions (Signed)
Return if any problems.

## 2018-07-31 NOTE — ED Provider Notes (Signed)
[facility] EMERGENCY DEPARTMENT Provider Note   CSN: [id number] Arrival date & time: 07/31/18  6659    History   Chief Complaint Chief Complaint  Patient presents with  . Generalized Body Aches    HPI Philip Gonzalez is a 53 y.o. male.     The history is provided by the patient. No language interpreter was used.  Cough  Cough characteristics:  Productive Sputum characteristics:  Nondescript Severity:  Moderate Onset quality:  Gradual Duration:  1 week Timing:  Constant Progression:  Worsening Chronicity:  New Smoker: yes   Context: upper respiratory infection   Relieved by:  Nothing Worsened by:  Nothing Ineffective treatments:  None tried Associated symptoms: fever and myalgias   Associated symptoms: no chest pain   Pt complains of a cough.  Pt coughing up green phelgm.  Pt reports streak of blood today   Past Medical History:  Diagnosis Date  . Acid reflux   . Hemorrhoid   . Hypertension   . PTSD (post-traumatic stress disorder)   . STD (male)     Patient Active Problem List   Diagnosis Date Noted  . MDD (major depressive disorder), single episode, severe with psychosis (HCC) 07/15/2016  . MDMA abuse (HCC) 07/15/2016  . Cannabis use disorder, severe, dependence (HCC) 07/15/2016  . Alcohol use disorder, moderate, dependence (HCC) 07/15/2016  . Tobacco use disorder 07/15/2016  . Benzodiazepine abuse (HCC) 07/15/2016  . Chronic back pain 07/15/2016  . Essential hypertension 07/15/2016  . PTSD (post-traumatic stress disorder) 07/14/2016    History reviewed. No pertinent surgical history.      Home Medications    Prior to Admission medications   Medication Sig Start Date End Date Taking? Authorizing Provider  cetirizine (ZYRTEC) 10 MG tablet Take 1 tablet (10 mg total) by mouth daily. Patient not taking: Reported on 06/05/2018 03/12/18   Caccavale, Sophia, PA-C  doxycycline (VIBRAMYCIN) 100 MG capsule Take 1 capsule (100 mg total) by mouth 2 (two)  times daily. One po bid x 7 days Patient not taking: Reported on 06/05/2018 04/18/18   Bethann Berkshire, MD  hydrochlorothiazide (MICROZIDE) 12.5 MG capsule Take 1 capsule (12.5 mg total) by mouth daily. 07/23/16   Adonis Brook, NP  ibuprofen (ADVIL,MOTRIN) 600 MG tablet Take 1 tablet (600 mg total) by mouth every 6 (six) hours as needed. Patient not taking: Reported on 06/05/2018 01/30/18   Elson Areas, PA-C    Family History Family History  Problem Relation Age of Onset  . Cancer Mother   . Depression Mother   . Depression Maternal Aunt     Social History Social History   Tobacco Use  . Smoking status: Current Every Day Smoker    Packs/day: 0.00    Years: 14.00    Pack years: 0.00    Types: Cigarettes    Last attempt to quit: 01/18/2016    Years since quitting: 2.5  . Smokeless tobacco: Never Used  . Tobacco comment: 3-4 cigarettes daily   Substance Use Topics  . Alcohol use: Yes    Comment: occ  . Drug use: No     Allergies   Bee venom and Poison oak extract [poison oak extract]   Review of Systems Review of Systems  Constitutional: Positive for fever.  Respiratory: Positive for cough.   Cardiovascular: Negative for chest pain.  Musculoskeletal: Positive for myalgias.  All other systems reviewed and are negative.    Physical Exam Updated Vital Signs BP 130/88 (BP Location: Right Arm)  Pulse 91   Temp (!) 97.3 F (36.3 C) (Oral)   Resp 20   Ht 5\' 6"  (1.676 m)   Wt 83.9 kg   SpO2 97%   BMI 29.86 kg/m   Physical Exam Vitals signs and nursing note reviewed.  Constitutional:      Appearance: He is well-developed.  HENT:     Head: Normocephalic and atraumatic.     Right Ear: External ear normal.     Left Ear: External ear normal.     Mouth/Throat:     Mouth: Mucous membranes are moist.  Eyes:     Conjunctiva/sclera: Conjunctivae normal.  Neck:     Musculoskeletal: Neck supple.  Cardiovascular:     Rate and Rhythm: Normal rate and regular rhythm.      Heart sounds: No murmur.  Pulmonary:     Effort: Pulmonary effort is normal. No respiratory distress.     Breath sounds: Normal breath sounds.  Abdominal:     Palpations: Abdomen is soft.     Tenderness: There is no abdominal tenderness.  Musculoskeletal: Normal range of motion.  Skin:    General: Skin is warm and dry.  Neurological:     General: No focal deficit present.     Mental Status: He is alert.  Psychiatric:        Mood and Affect: Mood normal.      ED Treatments / Results  Labs (all labs ordered are listed, but only abnormal results are displayed) Labs Reviewed - No data to display  EKG None  Radiology Dg Chest 2 View  Result Date: 07/31/2018 CLINICAL DATA:  Two week history of cough. Hemoptysis that began yesterday. Current smoker. EXAM: CHEST - 2 VIEW COMPARISON:  04/18/2018 and earlier. FINDINGS: Cardiomediastinal silhouette unremarkable and unchanged. Minimal linear scarring in the RIGHT MIDDLE LOBE, unchanged. Lungs otherwise clear. Bronchovascular markings normal. Pulmonary vascularity normal. No visible pleural effusions. No pneumothorax. Degenerative changes involving the upper and mid thoracic spine. IMPRESSION: No acute cardiopulmonary disease. Electronically Signed   By: Hulan Saas M.D.   On: 07/31/2018 09:18    Procedures Procedures (including critical care time)  Medications Ordered in ED Medications - No data to display   Initial Impression / Assessment and Plan / ED Course  I have reviewed the triage vital signs and the nursing notes.  Pertinent labs & imaging results that were available during my care of the patient were reviewed by me and considered in my medical decision making (see chart for details).        MDM   I suspect bropnchitis.  Pt has been sick for over a week.  I will treat with Doxycycline   Final Clinical Impressions(s) / ED Diagnoses   Final diagnoses:  Bronchitis    ED Discharge Orders         Ordered     doxycycline (VIBRAMYCIN) 100 MG capsule  2 times daily     07/31/18 0927    ibuprofen (ADVIL,MOTRIN) 600 MG tablet  Every 6 hours PRN     07/31/18 8295        An After Visit Summary was printed and given to the patient.    Elson Areas, PA-C 07/31/18 0930    Samuel Jester, DO 08/04/18 1845

## 2018-07-31 NOTE — ED Triage Notes (Signed)
Pt c/o generalized body aches, productive cough of green, yellow and red color x 1 week. Unknown fever at home.

## 2018-10-31 ENCOUNTER — Encounter (HOSPITAL_COMMUNITY): Payer: Self-pay | Admitting: *Deleted

## 2018-10-31 ENCOUNTER — Other Ambulatory Visit: Payer: Self-pay

## 2018-10-31 ENCOUNTER — Emergency Department (HOSPITAL_COMMUNITY)
Admission: EM | Admit: 2018-10-31 | Discharge: 2018-10-31 | Disposition: A | Discharge status: Home / Self Care | Payer: Non-veteran care | Attending: Emergency Medicine | Admitting: Emergency Medicine

## 2018-10-31 ENCOUNTER — Emergency Department (HOSPITAL_COMMUNITY): Payer: Non-veteran care

## 2018-10-31 DIAGNOSIS — B9789 Other viral agents as the cause of diseases classified elsewhere: Secondary | ICD-10-CM | POA: Insufficient documentation

## 2018-10-31 DIAGNOSIS — J069 Acute upper respiratory infection, unspecified: Secondary | ICD-10-CM

## 2018-10-31 DIAGNOSIS — Z20828 Contact with and (suspected) exposure to other viral communicable diseases: Secondary | ICD-10-CM | POA: Insufficient documentation

## 2018-10-31 DIAGNOSIS — I10 Essential (primary) hypertension: Secondary | ICD-10-CM | POA: Insufficient documentation

## 2018-10-31 DIAGNOSIS — F17210 Nicotine dependence, cigarettes, uncomplicated: Secondary | ICD-10-CM | POA: Insufficient documentation

## 2018-10-31 DIAGNOSIS — R509 Fever, unspecified: Secondary | ICD-10-CM | POA: Insufficient documentation

## 2018-10-31 LAB — SARS CORONAVIRUS 2 BY RT PCR (HOSPITAL ORDER, PERFORMED IN ~~LOC~~ HOSPITAL LAB): SARS Coronavirus 2: NEGATIVE

## 2018-10-31 MED ORDER — BENZONATATE 100 MG PO CAPS: 100.0000 mg | ORAL_CAPSULE | Freq: Three times a day (TID) | ORAL | 0 refills | Status: AC

## 2018-10-31 MED ORDER — ACETAMINOPHEN 325 MG PO TABS
650.0000 mg | ORAL_TABLET | Freq: Once | ORAL | Status: AC
  Administered 2018-10-31: 650 mg via ORAL
  Filled 2018-10-31: qty 2

## 2018-10-31 NOTE — ED Notes (Signed)
Patient verbalizes understanding of discharge instructions. Opportunity for questioning and answers were provided. Armband removed by staff, pt discharged from ED.  

## 2018-10-31 NOTE — ED Provider Notes (Signed)
[facility] Provider Note   CSN: [id number] Arrival date & time: 10/31/18  1331    History   Chief Complaint Chief Complaint  Patient presents with  . Cough    HPI Janeece FittingOscar G Urista is a 53 y.o. male.     Patient reports cough with intermittent fever, onset 3 days ago. No known sick contacts. Lives with his 51 year old relative.  The history is provided by the patient. No language interpreter was used.  Cough  Cough characteristics:  Productive Sputum characteristics:  Nondescript Severity:  Moderate Onset quality:  Sudden Duration:  3 days Timing:  Intermittent Progression:  Waxing and waning Chronicity:  Recurrent Smoker: yes   Ineffective treatments:  None tried Associated symptoms: fever and myalgias   Associated symptoms: no chest pain and no shortness of breath     Past Medical History:  Diagnosis Date  . Acid reflux   . Hemorrhoid   . Hypertension   . PTSD (post-traumatic stress disorder)   . STD (male)     Patient Active Problem List   Diagnosis Date Noted  . MDD (major depressive disorder), single episode, severe with psychosis (HCC) 07/15/2016  . MDMA abuse (HCC) 07/15/2016  . Cannabis use disorder, severe, dependence (HCC) 07/15/2016  . Alcohol use disorder, moderate, dependence (HCC) 07/15/2016  . Tobacco use disorder 07/15/2016  . Benzodiazepine abuse (HCC) 07/15/2016  . Chronic back pain 07/15/2016  . Essential hypertension 07/15/2016  . PTSD (post-traumatic stress disorder) 07/14/2016    History reviewed. No pertinent surgical history.      Home Medications    Prior to Admission medications   Medication Sig Start Date End Date Taking? Authorizing Provider  doxycycline (VIBRAMYCIN) 100 MG capsule Take 1 capsule (100 mg total) by mouth 2 (two) times daily. 07/31/18   Elson AreasSofia, Leslie K, PA-C  hydrochlorothiazide (MICROZIDE) 12.5 MG capsule Take 1 capsule (12.5 mg total) by mouth daily. 07/23/16   Adonis BrookAgustin,  Sheila, NP  ibuprofen (ADVIL,MOTRIN) 600 MG tablet Take 1 tablet (600 mg total) by mouth every 6 (six) hours as needed. 07/31/18   Elson AreasSofia, Leslie K, PA-C    Family History Family History  Problem Relation Age of Onset  . Cancer Mother   . Depression Mother   . Depression Maternal Aunt     Social History Social History   Tobacco Use  . Smoking status: Current Every Day Smoker    Packs/day: 0.00    Years: 14.00    Pack years: 0.00    Types: Cigarettes    Last attempt to quit: 01/18/2016    Years since quitting: 2.7  . Smokeless tobacco: Never Used  . Tobacco comment: 3-4 cigarettes daily   Substance Use Topics  . Alcohol use: Yes    Comment: occ  . Drug use: No     Allergies   Bee venom and Poison oak extract [poison oak extract]   Review of Systems Review of Systems  Constitutional: Positive for fever.  Respiratory: Positive for cough. Negative for shortness of breath.   Cardiovascular: Negative for chest pain.  Musculoskeletal: Positive for myalgias.  All other systems reviewed and are negative.    Physical Exam Updated Vital Signs BP (!) 146/97 (BP Location: Left Arm)   Pulse (!) 115   Temp 98.6 F (37 C) (Oral)   Resp 18   SpO2 96%   Physical Exam Constitutional:      Appearance: Normal appearance.  HENT:     Head: Normocephalic.  Mouth/Throat:     Mouth: Mucous membranes are moist.  Eyes:     Conjunctiva/sclera: Conjunctivae normal.  Neck:     Musculoskeletal: Normal range of motion.  Cardiovascular:     Rate and Rhythm: Normal rate and regular rhythm.  Pulmonary:     Effort: Pulmonary effort is normal.     Breath sounds: Normal breath sounds.  Abdominal:     Palpations: Abdomen is soft.  Musculoskeletal: Normal range of motion.  Skin:    General: Skin is warm and dry.  Neurological:     Mental Status: He is alert and oriented to person, place, and time.  Psychiatric:        Mood and Affect: Mood normal.      ED Treatments /  Results  Labs (all labs ordered are listed, but only abnormal results are displayed) Labs Reviewed - No data to display  EKG None  Radiology Dg Chest 2 View  Result Date: 10/31/2018 CLINICAL DATA:  Chest pain and productive cough for 2 days. EXAM: CHEST - 2 VIEW COMPARISON:  07/31/2018 FINDINGS: The the cardiac silhouette, mediastinal and hilar contours are within normal limits. The lungs are clear. No pleural effusion. No worrisome pulmonary lesions. The bony thorax is intact. IMPRESSION: No acute cardiopulmonary findings. Electronically Signed   By: Rudie Meyer M.D.   On: 10/31/2018 14:37    Procedures Procedures (including critical care time)  Medications Ordered in ED Medications - No data to display   Initial Impression / Assessment and Plan / ED Course  I have reviewed the triage vital signs and the nursing notes.  Pertinent labs & imaging results that were available during my care of the patient were reviewed by me and considered in my medical decision making (see chart for details).        Pt symptoms consistent with URI with cough. Covid testing negative. CXR negative for acute infiltrate. Pt will be discharged with symptomatic treatment.  Discussed return precautions.  Pt is hemodynamically stable & in NAD prior to discharge.  Final Clinical Impressions(s) / ED Diagnoses   Final diagnoses:  Viral URI with cough    ED Discharge Orders         Ordered    benzonatate (TESSALON) 100 MG capsule  Every 8 hours     10/31/18 1842           Felicie Morn, NP 10/31/18 2258    Charlynne Pander, MD 11/01/18 1447

## 2018-10-31 NOTE — ED Triage Notes (Signed)
Pt in c/o productive cough for the last few days with intermittent fever, no known sick contacts

## 2018-12-09 ENCOUNTER — Other Ambulatory Visit: Payer: Self-pay

## 2018-12-09 ENCOUNTER — Emergency Department (HOSPITAL_COMMUNITY)
Admission: EM | Admit: 2018-12-09 | Discharge: 2018-12-09 | Disposition: A | Discharge status: Home / Self Care | Payer: No Typology Code available for payment source | Attending: Emergency Medicine | Admitting: Emergency Medicine

## 2018-12-09 ENCOUNTER — Encounter (HOSPITAL_COMMUNITY): Payer: Self-pay | Admitting: *Deleted

## 2018-12-09 DIAGNOSIS — Z202 Contact with and (suspected) exposure to infections with a predominantly sexual mode of transmission: Secondary | ICD-10-CM | POA: Diagnosis not present

## 2018-12-09 DIAGNOSIS — L299 Pruritus, unspecified: Secondary | ICD-10-CM | POA: Diagnosis not present

## 2018-12-09 DIAGNOSIS — R300 Dysuria: Secondary | ICD-10-CM | POA: Insufficient documentation

## 2018-12-09 DIAGNOSIS — F17210 Nicotine dependence, cigarettes, uncomplicated: Secondary | ICD-10-CM | POA: Insufficient documentation

## 2018-12-09 DIAGNOSIS — I10 Essential (primary) hypertension: Secondary | ICD-10-CM | POA: Diagnosis not present

## 2018-12-09 LAB — URINALYSIS, ROUTINE W REFLEX MICROSCOPIC
Bilirubin Urine: NEGATIVE
Glucose, UA: NEGATIVE mg/dL
Hgb urine dipstick: NEGATIVE
Ketones, ur: NEGATIVE mg/dL
Leukocytes,Ua: NEGATIVE
Nitrite: NEGATIVE
Protein, ur: NEGATIVE mg/dL
Specific Gravity, Urine: 1.018 (ref 1.005–1.030)
pH: 6 (ref 5.0–8.0)

## 2018-12-09 LAB — COMPREHENSIVE METABOLIC PANEL
ALT: 11 U/L (ref 0–44)
AST: 19 U/L (ref 15–41)
Albumin: 3.7 g/dL (ref 3.5–5.0)
Alkaline Phosphatase: 84 U/L (ref 38–126)
Anion gap: 8 (ref 5–15)
BUN: 14 mg/dL (ref 6–20)
CO2: 27 mmol/L (ref 22–32)
Calcium: 8.7 mg/dL — ABNORMAL LOW (ref 8.9–10.3)
Chloride: 104 mmol/L (ref 98–111)
Creatinine, Ser: 1.29 mg/dL — ABNORMAL HIGH (ref 0.61–1.24)
GFR calc Af Amer: >60 mL/min (ref >60)
GFR calc non Af Amer: >60 mL/min (ref >60)
Glucose, Bld: 97 mg/dL (ref 70–99)
Potassium: 4.3 mmol/L (ref 3.5–5.1)
Sodium: 139 mmol/L (ref 135–145)
Total Bilirubin: 1.5 mg/dL — ABNORMAL HIGH (ref 0.3–1.2)
Total Protein: 6.6 g/dL (ref 6.5–8.1)

## 2018-12-09 LAB — CBC WITH DIFFERENTIAL/PLATELET
Abs Immature Granulocytes: 0.02 10*3/uL (ref 0.00–0.07)
Basophils Absolute: 0 10*3/uL (ref 0.0–0.1)
Basophils Relative: 1 %
Eosinophils Absolute: 0.1 10*3/uL (ref 0.0–0.5)
Eosinophils Relative: 2 %
HCT: 44.1 % (ref 39.0–52.0)
Hemoglobin: 13.5 g/dL (ref 13.0–17.0)
Immature Granulocytes: 0 %
Lymphocytes Relative: 14 %
Lymphs Abs: 0.8 10*3/uL (ref 0.7–4.0)
MCH: 32.7 pg (ref 26.0–34.0)
MCHC: 30.6 g/dL (ref 30.0–36.0)
MCV: 106.8 fL — ABNORMAL HIGH (ref 80.0–100.0)
Monocytes Absolute: 0.5 10*3/uL (ref 0.1–1.0)
Monocytes Relative: 8 %
Neutro Abs: 4.3 10*3/uL (ref 1.7–7.7)
Neutrophils Relative %: 75 %
Platelets: 206 10*3/uL (ref 150–400)
RBC: 4.13 MIL/uL — ABNORMAL LOW (ref 4.22–5.81)
RDW: 13.2 % (ref 11.5–15.5)
WBC: 5.7 10*3/uL (ref 4.0–10.5)
nRBC: 0 % (ref 0.0–0.2)

## 2018-12-09 MED ORDER — AZITHROMYCIN 250 MG PO TABS
1000.0000 mg | ORAL_TABLET | Freq: Once | ORAL | Status: AC
  Administered 2018-12-09: 1000 mg via ORAL
  Filled 2018-12-09: qty 4

## 2018-12-09 MED ORDER — CEFTRIAXONE SODIUM 250 MG IJ SOLR
250.0000 mg | Freq: Once | INTRAMUSCULAR | Status: AC
  Administered 2018-12-09: 250 mg via INTRAMUSCULAR
  Filled 2018-12-09: qty 250

## 2018-12-09 MED ORDER — STERILE WATER FOR INJECTION IJ SOLN
INTRAMUSCULAR | Status: AC
  Administered 2018-12-09: 0.9 mL
  Filled 2018-12-09: qty 10

## 2018-12-09 MED ORDER — HYDROXYZINE HCL 25 MG PO TABS: 25.0000 mg | ORAL_TABLET | Freq: Four times a day (QID) | ORAL | 0 refills | Status: AC | PRN

## 2018-12-09 NOTE — ED Provider Notes (Signed)
Emergency Department Provider Note   I have reviewed the triage vital signs and the nursing notes.   HISTORY  Chief Complaint Dysuria   HPI Philip Gonzalez is a 53 y.o. male with PMH of HTN presents to the emergency department for evaluation of burning with urination and itching to the palms and soles over the past 3 days.  No new foods or rash.  Patient denies any fevers, chills, flank pain.  No joint discomfort.  He has not appreciated any significant urethral discharge.  He states that he has been sexually active and has some suspicion that he could have been exposed to an STD.  He denies any abdominal pain, vomiting, nausea.  No chest pain or shortness of breath.  Itching in the hands and feet began at the same time as the urinary discomfort.   Past Medical History:  Diagnosis Date  . Acid reflux   . Hemorrhoid   . Hypertension   . PTSD (post-traumatic stress disorder)   . STD (male)     Patient Active Problem List   Diagnosis Date Noted  . MDD (major depressive disorder), single episode, severe with psychosis ([place]) 07/15/2016  . MDMA abuse ([place]) 07/15/2016  . Cannabis use disorder, severe, dependence ([place]) 07/15/2016  . Alcohol use disorder, moderate, dependence ([place]) 07/15/2016  . Tobacco use disorder 07/15/2016  . Benzodiazepine abuse ([place]) 07/15/2016  . Chronic back pain 07/15/2016  . Essential hypertension 07/15/2016  . PTSD (post-traumatic stress disorder) 07/14/2016    History reviewed. No pertinent surgical history.  Allergies Bee venom and Poison oak extract [poison oak extract]  Family History  Problem Relation Age of Onset  . Cancer Mother   . Depression Mother   . Depression Maternal Aunt     Social History Social History   Tobacco Use  . Smoking status: Current Every Day Smoker    Packs/day: 0.00    Years: 14.00    Pack years: 0.00    Types: Cigarettes    Last attempt to quit: 01/18/2016    Years since quitting: 2.8  . Smokeless tobacco:  Never Used  . Tobacco comment: 3-4 cigarettes daily   Substance Use Topics  . Alcohol use: Yes    Comment: occ  . Drug use: No    Review of Systems  Constitutional: No fever/chills Eyes: No visual changes. ENT: No sore throat. Cardiovascular: Denies chest pain. Respiratory: Denies shortness of breath. Gastrointestinal: No abdominal pain.  No nausea, no vomiting.  No diarrhea.  No constipation. Genitourinary: Positive for dysuria. Musculoskeletal: Negative for back pain. Skin: Negative for rash. Itching in the hands/feet.  Neurological: Negative for headaches, focal weakness or numbness.  10-point ROS otherwise negative.  ____________________________________________   PHYSICAL EXAM:  VITAL SIGNS: ED Triage Vitals  Enc Vitals Group     BP 12/09/18 1118 (!) 134/98     Pulse Rate 12/09/18 1118 69     Resp 12/09/18 1118 16     Temp 12/09/18 1118 98.2 F (36.8 C)     Temp Source 12/09/18 1118 Oral     SpO2 12/09/18 1118 97 %     Weight 12/09/18 1116 175 lb (79.4 kg)     Height 12/09/18 1116 5\' 7"  (1.702 m)   Constitutional: Alert and oriented. Well appearing and in no acute distress. Eyes: Conjunctivae are normal.  Head: Atraumatic. Nose: No congestion/rhinnorhea. Mouth/Throat: Mucous membranes are moist. Neck: No stridor.   Cardiovascular: Normal rate, regular rhythm. Respiratory: Normal respiratory effort.  Gastrointestinal: No RUQ tenderness or other abdominal tenderness on exam. No distention.  {Musculoskeletal: No lower extremity tenderness nor edema. No gross deformities of extremities. Neurologic:  Normal speech and language.  Skin:  Skin is warm, dry and intact. No rash noted.  ____________________________________________   LABS (all labs ordered are listed, but only abnormal results are displayed)  Labs Reviewed  COMPREHENSIVE METABOLIC PANEL - Abnormal; Notable for the following components:      Result Value   Creatinine, Ser 1.29 (*)    Calcium 8.7  (*)    Total Bilirubin 1.5 (*)    All other components within normal limits  CBC WITH DIFFERENTIAL/PLATELET - Abnormal; Notable for the following components:   RBC 4.13 (*)    MCV 106.8 (*)    All other components within normal limits  URINALYSIS, ROUTINE W REFLEX MICROSCOPIC  HIV ANTIBODY (ROUTINE TESTING W REFLEX)  RPR  GC/CHLAMYDIA PROBE AMP ([place]) NOT AT [facility]   ____________________________________________   PROCEDURES  Procedure(s) performed:   Procedures  None  ____________________________________________   INITIAL IMPRESSION / ASSESSMENT AND PLAN / ED COURSE  Pertinent labs & imaging results that were available during my care of the patient were reviewed by me and considered in my medical decision making (see chart for details).   Patient presents to the emergency department with dysuria and itching to the palms and soles.  He is well-appearing without abdominal pain or other GI symptoms.  Lower suspicion for biliary cause of the patient's itching but do plan on sending basic labs including LFTs and bilirubin.  Patient's UA is unremarkable.  He has had unprotected sexual encounters which he suspects could have resulted in him contracting an STD.  Will send HIV and syphilis testing after discussing with the patient.  Have also send urine gonorrhea and chlamydia.  We will treat empirically for both here in the emergency department.  Discussed safer sex practices and need to notify any and all sexual partners regarding symptoms and encourage treatment as well.   ____________________________________________  FINAL CLINICAL IMPRESSION(S) / ED DIAGNOSES  Final diagnoses:  Dysuria  Pruritus     MEDICATIONS GIVEN DURING THIS VISIT:  Medications  azithromycin (ZITHROMAX) tablet 1,000 mg (1,000 mg Oral Given 12/09/18 1212)  cefTRIAXone (ROCEPHIN) injection 250 mg (250 mg Intramuscular Given 12/09/18 1213)  sterile water (preservative free) injection (0.9 mLs  Given  12/09/18 1215)     NEW OUTPATIENT MEDICATIONS STARTED DURING THIS VISIT:  Discharge Medication List as of 12/09/2018 12:43 PM    START taking these medications   Details  hydrOXYzine (ATARAX/VISTARIL) 25 MG tablet Take 1 tablet (25 mg total) by mouth every 6 (six) hours as needed for itching., Starting Sun 12/09/2018, Print        Note:  This document was prepared using Dragon voice recognition software and may include unintentional dictation errors.  Alona BeneJoshua Charla Criscione, MD Emergency Medicine    Shayona Hibbitts, Arlyss RepressJoshua G, MD 12/09/18 1840

## 2018-12-09 NOTE — Discharge Instructions (Signed)
You were evaluated in the emergency department with burning on urination and itching in the hands and feet.  You were treated for sexually transmitted infection today.  You will be contacted if your HIV or syphilis testing comes back positive.  I have prescribed Atarax as needed for itching.  This may cause drowsiness so do not take if you will be driving a car or performing other important tasks or you need to be alert.  Please contact any sexual partners and encouraged him to be evaluated and tested as well.  Return to the emergency department with any abdominal pain, chest pain, or other worsening symptoms.

## 2018-12-09 NOTE — ED Triage Notes (Signed)
Burning with urination for past 3 days, also c/o itching to palms and bottoms of feet today. Denies any new foods or items.

## 2018-12-10 LAB — RPR: RPR Ser Ql: NONREACTIVE

## 2018-12-10 LAB — HIV ANTIBODY (ROUTINE TESTING W REFLEX): HIV Screen 4th Generation wRfx: NONREACTIVE

## 2018-12-11 LAB — GC/CHLAMYDIA PROBE AMP (~~LOC~~) NOT AT ARMC
Chlamydia: NEGATIVE
Neisseria Gonorrhea: NEGATIVE

## 2019-01-23 ENCOUNTER — Encounter (HOSPITAL_COMMUNITY): Payer: Self-pay

## 2019-01-23 ENCOUNTER — Telehealth (HOSPITAL_COMMUNITY): Payer: Self-pay | Admitting: Emergency Medicine

## 2019-01-23 ENCOUNTER — Other Ambulatory Visit: Payer: Self-pay

## 2019-01-23 ENCOUNTER — Emergency Department (HOSPITAL_COMMUNITY)
Admission: EM | Admit: 2019-01-23 | Discharge: 2019-01-23 | Disposition: A | Discharge status: Home / Self Care | Payer: No Typology Code available for payment source | Attending: Emergency Medicine | Admitting: Emergency Medicine

## 2019-01-23 DIAGNOSIS — R300 Dysuria: Secondary | ICD-10-CM | POA: Diagnosis not present

## 2019-01-23 DIAGNOSIS — M79602 Pain in left arm: Secondary | ICD-10-CM | POA: Diagnosis not present

## 2019-01-23 DIAGNOSIS — F17210 Nicotine dependence, cigarettes, uncomplicated: Secondary | ICD-10-CM | POA: Insufficient documentation

## 2019-01-23 DIAGNOSIS — R200 Anesthesia of skin: Secondary | ICD-10-CM | POA: Diagnosis not present

## 2019-01-23 DIAGNOSIS — I10 Essential (primary) hypertension: Secondary | ICD-10-CM | POA: Insufficient documentation

## 2019-01-23 DIAGNOSIS — M792 Neuralgia and neuritis, unspecified: Secondary | ICD-10-CM

## 2019-01-23 MED ORDER — CEFTRIAXONE SODIUM 250 MG IJ SOLR
250.0000 mg | Freq: Once | INTRAMUSCULAR | Status: AC
  Administered 2019-01-23: 11:00:00 250 mg via INTRAMUSCULAR
  Filled 2019-01-23: qty 250

## 2019-01-23 MED ORDER — NAPROXEN 500 MG PO TABS: 500.0000 mg | ORAL_TABLET | Freq: Two times a day (BID) | ORAL | 0 refills | Status: DC | PRN

## 2019-01-23 MED ORDER — AZITHROMYCIN 250 MG PO TABS
1000.0000 mg | ORAL_TABLET | Freq: Once | ORAL | Status: AC
  Administered 2019-01-23: 1000 mg via ORAL
  Filled 2019-01-23: qty 4

## 2019-01-23 NOTE — Telephone Encounter (Signed)
No phone encounter made.

## 2019-01-23 NOTE — ED Triage Notes (Signed)
Pt reports burning with urination, rash, and discharge from penis x 1 week.  Pt also c/o tingling and pain in left arm and hand x 2 weeks.  Reports neck pain and stiffness when he lays down.

## 2019-01-23 NOTE — Discharge Instructions (Signed)
You were seen in the emergency department today with burning on urination.  This is likely from an STD.  We are treating you today but it is possible to get reinfected.  Please notify any partner(s) regarding your symptoms and encourage them to get tested as well.  I have also provided contact information for a neurosurgeon in [place] who can address the left arm.  [facility] may [facility] to get a [facility] or they may have plans for you to see one of their spine doctors after your MRI.  Please call today to confirm that your MRI has been scheduled.  Return to the emergency department with any new or suddenly worsening symptoms.

## 2019-01-23 NOTE — ED Provider Notes (Signed)
Emergency Department Provider Note   I have reviewed the triage vital signs and the nursing notes.   HISTORY  Chief Complaint S74.5   HPI Philip Gonzalez is a 53 y.o. male with PMH of prior STD and HTN presents to the emergency department for evaluation of dysuria with urethral discharge.  He describes 1 week of symptoms.  He has had unprotected sex and is concerned for possible STD.  He has burning with urination and greenish type discharge.  No fevers or chills.  No flulike illness.  Reports some associated neck discomfort with tingling/numbness in the left lateral hand.  He states that this is been present for approximately 3 weeks.  He is followed with the [facility] and they have scheduled an MRI as an outpatient.  He is waiting on that scheduling to be done.  Not significantly worsened over the past several weeks.    Past Medical History:  Diagnosis Date  . Acid reflux   . Hemorrhoid   . Hypertension   . PTSD (post-traumatic stress disorder)   . STD (male)     Patient Active Problem List   Diagnosis Date Noted  . MDD (major depressive disorder), single episode, severe with psychosis ([place]) 07/15/2016  . 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 Cannabis use disorder, severe, dependence ([place]) 07/15/2016  . Alcohol use disorder, moderate, dependence ([place]) 07/15/2016  . Tobacco use disorder 07/15/2016  . Benzodiazepine abuse ([place]) 07/15/2016  . Chronic back pain 07/15/2016  . Essential hypertension 07/15/2016  . PTSD (post-traumatic stress disorder) 07/14/2016    History reviewed. No pertinent surgical history.  Allergies Bee venom and Poison oak extract [poison oak extract]  Family History  Problem Relation Age of Onset  . Cancer Mother   . Depression Mother   . Depression Maternal Aunt     Social History Social History   Tobacco Use  . Smoking status: Current Every Day Smoker    Packs/day: 0.00    Years: 14.00    Pack years: 0.00    Types: Cigarettes    Last attempt to  quit: 01/18/2016    Years since quitting: 3.0  . Smokeless tobacco: Never Used  . Tobacco comment: 3-4 cigarettes daily   Substance Use Topics  . Alcohol use: Yes    Comment: occ  . Drug use: No    Review of Systems  Constitutional: No fever/chills Eyes: No visual changes. ENT: No sore throat. Cardiovascular: Denies chest pain. Respiratory: Denies shortness of breath. Gastrointestinal: No abdominal pain.  No nausea, no vomiting.  No diarrhea.  No constipation. Genitourinary: Positive for dysuria and urethral discharge.  Musculoskeletal: Negative for back pain. Positive neck pain.  Skin: Negative for rash. Neurological: Negative for headaches, focal weakness. Positive left lateral hand tingling/numbness.   10-point ROS otherwise negative.  ____________________________________________   PHYSICAL EXAM:  VITAL SIGNS: ED Triage Vitals  Enc Vitals Group     BP 01/23/19 1024 (!) 128/103     Pulse Rate 01/23/19 1024 71     Resp 01/23/19 1024 20     Temp 01/23/19 1024 97.7 F (36.5 C)     Temp Source 01/23/19 1024 Oral     SpO2 01/23/19 1024 98 %     Weight 01/23/19 1022 170 lb (77.1 kg)     Height 01/23/19 1022 5\' 7"  (1.702 m)   Constitutional: Alert and oriented. Well appearing and in no acute distress. Eyes: Conjunctivae are normal. Head: Atraumatic. Nose: No congestion/rhinnorhea. Mouth/Throat: Mucous membranes are moist.  Neck: No stridor. No cervical spine tenderness to palpation. Cardiovascular: Normal rate, regular rhythm. Good peripheral circulation. Grossly normal heart sounds.   Respiratory: Normal respiratory effort.  No retractions. Lungs CTAB. Gastrointestinal: Soft and nontender. No distention.  Genitourinary: Mild urethral discharge noted. No lesions or rash.  Musculoskeletal: No gross deformities of extremities. Neurologic:  Normal speech and language. Equal strength bilaterally. Subjective decreased sensation to light touch over the left lateral hand.   Skin:  Skin is warm, dry and intact. No rash noted.  ____________________________________________   LABS (all labs ordered are listed, but only abnormal results are displayed)  Labs Reviewed  HIV ANTIBODY (ROUTINE TESTING W REFLEX)  RPR  GC/CHLAMYDIA PROBE AMP ([place]) NOT AT [facility]   ____________________________________________   PROCEDURES  Procedure(s) performed:   Procedures  None  ____________________________________________   INITIAL IMPRESSION / ASSESSMENT AND PLAN / ED COURSE  Pertinent labs & imaging results that were available during my care of the patient were reviewed by me and considered in my medical decision making (see chart for details).   Patient presents to the emergency department with urethral discharge and dysuria.  Strongly suspect STD.  Patient was treated with azithromycin and Rocephin.  Will send HIV and RPR.  Patient to follow results on MyChart app.  Patient also with radicular type neck pain.  This is been going on for several weeks and [facility].  I have given the patient contact information for [place] in [place] who he could also call for evaluation but [place] [facility] [place] [facility] [place] [place].   Discharge the patient has vital signs which are documented as abnormal.  I believe these were entered in error.  Reassessed patient with vitals similar to his triage vitals.   ____________________________________________  FINAL CLINICAL IMPRESSION(S) / ED DIAGNOSES  Final diagnoses:  Dysuria  Radicular pain in left arm     MEDICATIONS GIVEN DURING THIS VISIT:  Medications  cefTRIAXone (ROCEPHIN) injection 250 mg (250 mg Intramuscular Given 01/23/19 1053)  azithromycin (ZITHROMAX) tablet 1,000 mg (1,000 mg Oral Given 01/23/19 1054)     NEW OUTPATIENT MEDICATIONS STARTED DURING THIS VISIT:  Discharge Medication List as of 01/23/2019 10:52 AM     START taking these medications   Details  naproxen (NAPROSYN) 500 MG tablet Take 1 tablet (500 mg total) by mouth 2 (two) times daily between meals as needed for moderate pain., Starting Wed 01/23/2019, Print        Note:  This document was prepared using Dragon voice recognition software and may include unintentional dictation errors.  Alona BeneJoshua Burtis Imhoff, MD Emergency Medicine    Saleha Kalp, Arlyss RepressJoshua G, MD 01/23/19 [phone]

## 2019-01-24 LAB — RPR: RPR Ser Ql: NONREACTIVE

## 2019-01-24 LAB — GC/CHLAMYDIA PROBE AMP (~~LOC~~) NOT AT ARMC
Chlamydia: NEGATIVE
Neisseria Gonorrhea: POSITIVE — AB

## 2019-01-24 LAB — HIV ANTIBODY (ROUTINE TESTING W REFLEX): HIV Screen 4th Generation wRfx: NONREACTIVE

## 2021-07-13 ENCOUNTER — Other Ambulatory Visit: Payer: Self-pay

## 2021-07-13 ENCOUNTER — Emergency Department (HOSPITAL_COMMUNITY)
Admission: EM | Admit: 2021-07-13 | Discharge: 2021-07-13 | Disposition: A | Discharge status: Home / Self Care | Payer: No Typology Code available for payment source | Attending: Emergency Medicine | Admitting: Emergency Medicine

## 2021-07-13 ENCOUNTER — Encounter (HOSPITAL_COMMUNITY): Payer: Self-pay

## 2021-07-13 DIAGNOSIS — R509 Fever, unspecified: Secondary | ICD-10-CM | POA: Insufficient documentation

## 2021-07-13 DIAGNOSIS — Z20822 Contact with and (suspected) exposure to covid-19: Secondary | ICD-10-CM | POA: Insufficient documentation

## 2021-07-13 DIAGNOSIS — R051 Acute cough: Secondary | ICD-10-CM | POA: Insufficient documentation

## 2021-07-13 DIAGNOSIS — Z113 Encounter for screening for infections with a predominantly sexual mode of transmission: Secondary | ICD-10-CM | POA: Insufficient documentation

## 2021-07-13 DIAGNOSIS — R059 Cough, unspecified: Secondary | ICD-10-CM | POA: Diagnosis present

## 2021-07-13 DIAGNOSIS — I10 Essential (primary) hypertension: Secondary | ICD-10-CM | POA: Insufficient documentation

## 2021-07-13 DIAGNOSIS — R369 Urethral discharge, unspecified: Secondary | ICD-10-CM | POA: Insufficient documentation

## 2021-07-13 DIAGNOSIS — Z79899 Other long term (current) drug therapy: Secondary | ICD-10-CM | POA: Insufficient documentation

## 2021-07-13 LAB — COMPREHENSIVE METABOLIC PANEL
ALT: 28 U/L (ref 0–44)
AST: 41 U/L (ref 15–41)
Albumin: 4.2 g/dL (ref 3.5–5.0)
Alkaline Phosphatase: 92 U/L (ref 38–126)
Anion gap: 10 (ref 5–15)
BUN: 23 mg/dL — ABNORMAL HIGH (ref 6–20)
CO2: 27 mmol/L (ref 22–32)
Calcium: 8.8 mg/dL — ABNORMAL LOW (ref 8.9–10.3)
Chloride: 102 mmol/L (ref 98–111)
Creatinine, Ser: 1.12 mg/dL (ref 0.61–1.24)
GFR, Estimated: >60 mL/min (ref >60)
Glucose, Bld: 94 mg/dL (ref 70–99)
Potassium: 4.2 mmol/L (ref 3.5–5.1)
Sodium: 139 mmol/L (ref 135–145)
Total Bilirubin: 0.4 mg/dL (ref 0.3–1.2)
Total Protein: 7.5 g/dL (ref 6.5–8.1)

## 2021-07-13 LAB — CBC WITH DIFFERENTIAL/PLATELET
Abs Immature Granulocytes: 0.01 10*3/uL (ref 0.00–0.07)
Basophils Absolute: 0 10*3/uL (ref 0.0–0.1)
Basophils Relative: 1 %
Eosinophils Absolute: 0.2 10*3/uL (ref 0.0–0.5)
Eosinophils Relative: 3 %
HCT: 38.2 % — ABNORMAL LOW (ref 39.0–52.0)
Hemoglobin: 11.8 g/dL — ABNORMAL LOW (ref 13.0–17.0)
Immature Granulocytes: 0 %
Lymphocytes Relative: 32 %
Lymphs Abs: 2.1 10*3/uL (ref 0.7–4.0)
MCH: 32.9 pg (ref 26.0–34.0)
MCHC: 30.9 g/dL (ref 30.0–36.0)
MCV: 106.4 fL — ABNORMAL HIGH (ref 80.0–100.0)
Monocytes Absolute: 0.4 10*3/uL (ref 0.1–1.0)
Monocytes Relative: 6 %
Neutro Abs: 3.9 10*3/uL (ref 1.7–7.7)
Neutrophils Relative %: 58 %
Platelets: 172 10*3/uL (ref 150–400)
RBC: 3.59 MIL/uL — ABNORMAL LOW (ref 4.22–5.81)
RDW: 11.8 % (ref 11.5–15.5)
WBC: 6.7 10*3/uL (ref 4.0–10.5)
nRBC: 0 % (ref 0.0–0.2)

## 2021-07-13 LAB — RESP PANEL BY RT-PCR (FLU A&B, COVID) ARPGX2
Influenza A by PCR: NEGATIVE
Influenza B by PCR: NEGATIVE
SARS Coronavirus 2 by RT PCR: NEGATIVE

## 2021-07-13 MED ORDER — METRONIDAZOLE 500 MG PO TABS
2000.0000 mg | ORAL_TABLET | ORAL | Status: AC
  Administered 2021-07-13: 2000 mg via ORAL
  Filled 2021-07-13: qty 4

## 2021-07-13 MED ORDER — AZITHROMYCIN 250 MG PO TABS
1000.0000 mg | ORAL_TABLET | Freq: Once | ORAL | Status: AC
Start: 2021-07-13 — End: 2021-07-13
  Administered 2021-07-13: 1000 mg via ORAL
  Filled 2021-07-13: qty 4

## 2021-07-13 MED ORDER — CEFTRIAXONE SODIUM 500 MG IJ SOLR
500.0000 mg | Freq: Once | INTRAMUSCULAR | Status: AC
Start: 2021-07-13 — End: 2021-07-13
  Administered 2021-07-13: 500 mg via INTRAMUSCULAR
  Filled 2021-07-13: qty 500

## 2021-07-13 MED ORDER — SODIUM CHLORIDE 0.9 % IV BOLUS
1000.0000 mL | Freq: Once | INTRAVENOUS | Status: AC
  Administered 2021-07-13: 1000 mL via INTRAVENOUS

## 2021-07-13 NOTE — ED Triage Notes (Signed)
Pt reports cough x 1 week.  Reports was tested for covid last week but doesn't know the results.  ALso reports penile discharge x 1 week.  Reports "little" left flank pain.

## 2021-07-13 NOTE — Discharge Instructions (Signed)
Please return to ED with any new signs or symptoms such as shortness of breath, confusion Please begin to implement condoms into your sexual routine You may follow-up on the results of your COVID/flu screening by signing up for my chart.  There are instructions attached to this paperwork that detailed how to sign up for MyChart.  Once you sign up for MyChart, you can see the results of your flu and COVID screening In the event that you are positive for flu or COVID, please make all attempts to isolate yourself.  You may treat fevers with 1000 mg of Tylenol and body aches with 600 to 800 mg ibuprofen every 6 hours

## 2021-07-13 NOTE — ED Provider Notes (Signed)
[facility] EMERGENCY DEPARTMENT Provider Note   CSN: [id number] Arrival date & time: 07/13/21  1544     History  Chief Complaint  Patient presents with   Cough    Philip Gonzalez is a 56 y.o. male with medical history significant for hypertension, acid reflux, STDs.  Patient presents due to 2 to 3 weeks of ongoing cough, patient states that he has been around sick contacts.  Patient also reports that he has had penile discharge, requesting STD testing.  Patient states 2 days ago he had unprotected sex with a new partner, they did not use condoms.  Since then he has had burning with urination as well as white discharge from his penis.  Patient endorsing congestion, body aches, cough, chills, fever, diarrhea, nausea.  Patient denies vomiting, chest pain, shortness of breath.   Cough Associated symptoms: chills and fever   Associated symptoms: no chest pain and no shortness of breath       Home Medications Prior to Admission medications   Medication Sig Start Date End Date Taking? Authorizing Provider  benzonatate (TESSALON) 100 MG capsule Take 1 capsule (100 mg total) by mouth every 8 (eight) hours. 10/31/18   Etta Quill, NP  doxycycline (VIBRAMYCIN) 100 MG capsule Take 1 capsule (100 mg total) by mouth 2 (two) times daily. 07/31/18   Fransico Meadow, PA-C  hydrochlorothiazide (MICROZIDE) 12.5 MG capsule Take 1 capsule (12.5 mg total) by mouth daily. 07/23/16   Kerrie Buffalo, NP  hydrOXYzine (ATARAX/VISTARIL) 25 MG tablet Take 1 tablet (25 mg total) by mouth every 6 (six) hours as needed for itching. 12/09/18   Long, Wonda Olds, MD  ibuprofen (ADVIL,MOTRIN) 600 MG tablet Take 1 tablet (600 mg total) by mouth every 6 (six) hours as needed. 07/31/18   Fransico Meadow, PA-C  naproxen (NAPROSYN) 500 MG tablet Take 1 tablet (500 mg total) by mouth 2 (two) times daily between meals as needed for moderate pain. 01/23/19   Long, Wonda Olds, MD      Allergies    Bee venom and Poison oak extract  [poison oak extract]    Review of Systems   Review of Systems  Constitutional:  Positive for chills and fever.  HENT:  Positive for congestion.   Respiratory:  Positive for cough. Negative for shortness of breath.   Cardiovascular:  Negative for chest pain.  Gastrointestinal:  Positive for diarrhea and nausea. Negative for abdominal pain and vomiting.  Genitourinary:  Positive for dysuria and penile discharge.  All other systems reviewed and are negative.  Physical Exam Updated Vital Signs BP (!) 148/103 (BP Location: Left Arm) Comment: has not taken his bp medication today   Pulse 93    Temp 98.1 F (36.7 C) (Oral)    Resp 18    Ht 5\' 6"  (1.676 m)    Wt 88.5 kg    SpO2 98%    BMI 31.47 kg/m  Physical Exam Vitals and nursing note reviewed.  Constitutional:      General: He is not in acute distress.    Appearance: Normal appearance. He is not ill-appearing, toxic-appearing or diaphoretic.  HENT:     Head: Normocephalic and atraumatic.     Nose: Nose normal.     Mouth/Throat:     Mouth: Mucous membranes are moist.  Eyes:     Extraocular Movements: Extraocular movements intact.     Pupils: Pupils are equal, round, and reactive to light.  Cardiovascular:     Rate and  Rhythm: Normal rate and regular rhythm.  Pulmonary:     Effort: Pulmonary effort is normal.     Breath sounds: Normal breath sounds. No wheezing.  Abdominal:     General: Abdomen is flat.     Palpations: Abdomen is soft.     Tenderness: There is no abdominal tenderness.  Genitourinary:    Penis: Normal.      Testes: Normal.     Comments: White discharge noted Musculoskeletal:        General: Normal range of motion.     Cervical back: Normal range of motion and neck supple. No tenderness.  Skin:    General: Skin is warm and dry.     Capillary Refill: Capillary refill takes less than 2 seconds.  Neurological:     Mental Status: He is alert and oriented to person, place, and time. Mental status is at baseline.     ED Results / Procedures / Treatments   Labs (all labs ordered are listed, but only abnormal results are displayed) Labs Reviewed  COMPREHENSIVE METABOLIC PANEL - Abnormal; Notable for the following components:      Result Value   BUN 23 (*)    Calcium 8.8 (*)    All other components within normal limits  CBC WITH DIFFERENTIAL/PLATELET - Abnormal; Notable for the following components:   RBC 3.59 (*)    Hemoglobin 11.8 (*)    HCT 38.2 (*)    MCV 106.4 (*)    All other components within normal limits  RESP PANEL BY RT-PCR (FLU A&B, COVID) ARPGX2  CBC WITH DIFFERENTIAL/PLATELET  GC/CHLAMYDIA PROBE AMP ([place]) NOT AT [facility]    EKG None  Radiology No results found.  Procedures Procedures    Medications Ordered in ED Medications  cefTRIAXone (ROCEPHIN) injection 500 mg (has no administration in time range)  azithromycin (ZITHROMAX) tablet 1,000 mg (has no administration in time range)  metroNIDAZOLE (FLAGYL) tablet 2,000 mg (has no administration in time range)  sodium chloride 0.9 % bolus 1,000 mL (0 mLs Intravenous Stopped 07/13/21 1844)    ED Course/ Medical Decision Making/ A&P                           Medical Decision Making Amount and/or Complexity of Data Reviewed Labs: ordered.   56 year old male presents due to multiple complaints.  Initial complaint is coughing for 2 to 3 weeks, states he has had positive sick contacts.  Patient will be assessed utilizing respiratory panel.  At time of discharge, the patient's respiratory panel has not resulted.  The patient states that he will follow-up on the results of his respiratory panel on his own time.  I have given the patient instructions on how to proceed in the event that he is positive for flu or COVID.  Patient worked up utilizing following labs that were interpreted by me: CBC, respiratory panel, CMP, GC chlamydia probe CBC unremarkable.  Patient hemoglobin slightly declined 11.8.  Patient denies  lightheadedness, dizziness, fatigue. Patient CMP unremarkable.  Slight elevated BUN indicative of dehydration, patient been treated with 1 L of fluid. Respiratory panel has not resulted  Patient was complaining of penile discharge after having unprotected sex with a partner.  Patient will be screened utilizing gonorrhea, chlamydia evaluation but due to patient being symptomatic I will go ahead and treat the patient here.  I am worried about the patient being noncompliant on outpatient medication so I will treat him with 500  mg Rocephin, 1 g azithromycin, 2 g metronidazole.  Patient given return precautions, voices understanding.  All the patient's questions answered to his satisfaction.  Patient stable for discharge.   Final Clinical Impression(s) / ED Diagnoses Final diagnoses:  Acute cough  Routine screening for STI (sexually transmitted infection)  Penile discharge    Rx / DC Orders ED Discharge Orders     None         Lawana Chambers 07/13/21 Aldona Lento, MD 07/16/21 1040

## 2021-10-01 ENCOUNTER — Encounter (HOSPITAL_COMMUNITY): Payer: Self-pay | Admitting: *Deleted

## 2021-10-01 ENCOUNTER — Other Ambulatory Visit: Payer: Self-pay

## 2021-10-01 ENCOUNTER — Emergency Department (HOSPITAL_COMMUNITY): Payer: Medicaid Other

## 2021-10-01 ENCOUNTER — Emergency Department (HOSPITAL_COMMUNITY)
Admission: EM | Admit: 2021-10-01 | Discharge: 2021-10-01 | Disposition: A | Discharge status: Home / Self Care | Payer: Medicaid Other | Attending: Student | Admitting: Student

## 2021-10-01 DIAGNOSIS — R300 Dysuria: Secondary | ICD-10-CM

## 2021-10-01 DIAGNOSIS — M25561 Pain in right knee: Secondary | ICD-10-CM | POA: Diagnosis not present

## 2021-10-01 DIAGNOSIS — G8929 Other chronic pain: Secondary | ICD-10-CM

## 2021-10-01 DIAGNOSIS — N50812 Left testicular pain: Secondary | ICD-10-CM | POA: Diagnosis not present

## 2021-10-01 DIAGNOSIS — I861 Scrotal varices: Secondary | ICD-10-CM | POA: Diagnosis not present

## 2021-10-01 DIAGNOSIS — Z202 Contact with and (suspected) exposure to infections with a predominantly sexual mode of transmission: Secondary | ICD-10-CM | POA: Diagnosis not present

## 2021-10-01 DIAGNOSIS — N433 Hydrocele, unspecified: Secondary | ICD-10-CM | POA: Diagnosis not present

## 2021-10-01 DIAGNOSIS — R7989 Other specified abnormal findings of blood chemistry: Secondary | ICD-10-CM | POA: Insufficient documentation

## 2021-10-01 DIAGNOSIS — M25562 Pain in left knee: Secondary | ICD-10-CM | POA: Diagnosis not present

## 2021-10-01 DIAGNOSIS — R69 Illness, unspecified: Secondary | ICD-10-CM | POA: Diagnosis not present

## 2021-10-01 LAB — COMPREHENSIVE METABOLIC PANEL
ALT: 35 U/L (ref 0–44)
AST: 36 U/L (ref 15–41)
Albumin: 3.8 g/dL (ref 3.5–5.0)
Alkaline Phosphatase: 63 U/L (ref 38–126)
Anion gap: 8 (ref 5–15)
BUN: 24 mg/dL — ABNORMAL HIGH (ref 6–20)
CO2: 28 mmol/L (ref 22–32)
Calcium: 8.6 mg/dL — ABNORMAL LOW (ref 8.9–10.3)
Chloride: 102 mmol/L (ref 98–111)
Creatinine, Ser: 0.98 mg/dL (ref 0.61–1.24)
GFR, Estimated: >60 mL/min (ref >60)
Glucose, Bld: 95 mg/dL (ref 70–99)
Potassium: 4.1 mmol/L (ref 3.5–5.1)
Sodium: 138 mmol/L (ref 135–145)
Total Bilirubin: 0.6 mg/dL (ref 0.3–1.2)
Total Protein: 6.5 g/dL (ref 6.5–8.1)

## 2021-10-01 LAB — CBC WITH DIFFERENTIAL/PLATELET
Abs Immature Granulocytes: 0.03 10*3/uL (ref 0.00–0.07)
Basophils Absolute: 0 10*3/uL (ref 0.0–0.1)
Basophils Relative: 1 %
Eosinophils Absolute: 0.2 10*3/uL (ref 0.0–0.5)
Eosinophils Relative: 2 %
HCT: 42.8 % (ref 39.0–52.0)
Hemoglobin: 13.9 g/dL (ref 13.0–17.0)
Immature Granulocytes: 0 %
Lymphocytes Relative: 26 %
Lymphs Abs: 1.9 10*3/uL (ref 0.7–4.0)
MCH: 33.1 pg (ref 26.0–34.0)
MCHC: 32.5 g/dL (ref 30.0–36.0)
MCV: 101.9 fL — ABNORMAL HIGH (ref 80.0–100.0)
Monocytes Absolute: 0.5 10*3/uL (ref 0.1–1.0)
Monocytes Relative: 8 %
Neutro Abs: 4.5 10*3/uL (ref 1.7–7.7)
Neutrophils Relative %: 63 %
Platelets: 209 10*3/uL (ref 150–400)
RBC: 4.2 MIL/uL — ABNORMAL LOW (ref 4.22–5.81)
RDW: 12.3 % (ref 11.5–15.5)
WBC: 7.1 10*3/uL (ref 4.0–10.5)
nRBC: 0 % (ref 0.0–0.2)

## 2021-10-01 LAB — URINALYSIS, ROUTINE W REFLEX MICROSCOPIC
Bilirubin Urine: NEGATIVE
Glucose, UA: NEGATIVE mg/dL
Hgb urine dipstick: NEGATIVE
Ketones, ur: NEGATIVE mg/dL
Leukocytes,Ua: NEGATIVE
Nitrite: NEGATIVE
Protein, ur: NEGATIVE mg/dL
Specific Gravity, Urine: 1.02 (ref 1.005–1.030)
pH: 7 (ref 5.0–8.0)

## 2021-10-01 LAB — HIV ANTIBODY (ROUTINE TESTING W REFLEX): HIV Screen 4th Generation wRfx: NONREACTIVE

## 2021-10-01 MED ORDER — CEFTRIAXONE SODIUM 500 MG IJ SOLR
500.0000 mg | Freq: Once | INTRAMUSCULAR | Status: AC
  Administered 2021-10-01: 500 mg via INTRAMUSCULAR
  Filled 2021-10-01: qty 500

## 2021-10-01 MED ORDER — LIDOCAINE HCL (PF) 1 % IJ SOLN
INTRAMUSCULAR | Status: AC
  Filled 2021-10-01: qty 2

## 2021-10-01 MED ORDER — NAPROXEN 500 MG PO TABS: 500.0000 mg | ORAL_TABLET | Freq: Two times a day (BID) | ORAL | 0 refills | Status: DC

## 2021-10-01 MED ORDER — AZITHROMYCIN 250 MG PO TABS
1000.0000 mg | ORAL_TABLET | Freq: Once | ORAL | Status: AC
  Administered 2021-10-01: 1000 mg via ORAL
  Filled 2021-10-01: qty 4

## 2021-10-01 MED ORDER — METRONIDAZOLE 500 MG PO TABS
2000.0000 mg | ORAL_TABLET | Freq: Once | ORAL | Status: AC
  Administered 2021-10-01: 2000 mg via ORAL
  Filled 2021-10-01: qty 4

## 2021-10-01 NOTE — ED Triage Notes (Signed)
C/o L knee pain, on going for 5-6 months, distant injury, also mentions difficulty voiding and bilateral great toes purple. ?

## 2021-10-01 NOTE — ED Notes (Signed)
Pt able to walk to restroom w/o difficulty  ?

## 2021-10-01 NOTE — Discharge Instructions (Addendum)
You were seen here today for evaluation of your chronic knee pain, STD check, pain with urination, and testicular pain. Your labs are unremarkable. Please follow up with your MyChart to check on the results of your HIV, RPR, and gonorrhea and Chlamydia. We have treated you empirically for some STDs with the antibiotics you were given here. I am sending you home with naprosyn for you to take twice daily for the next 2 weeks. Additionally, I have included the information in for a orthopedic provider that I would like for you to follow up with. Please call to schedule an appointment. Please follow up with your PCP. Additionally, I have referred you to a urologist for you to follow up with. Please call to schedule an appointment.  ? ?Contact a doctor if: ?The knee pain does not stop. ?The knee pain changes or gets worse. ?You have a fever along with knee pain. ?Your knee is red or feels warm when you touch it. ?Your knee gives out or locks up. ?Get help right away if: ?Your knee swells, and the swelling gets worse. ?You cannot move your knee. ?You have very bad knee pain that does not get better with pain medicine. ?

## 2021-10-01 NOTE — ED Notes (Signed)
Lab at bedside

## 2021-10-01 NOTE — ED Provider Notes (Signed)
?[facility] EMERGENCY DEPARTMENT ?Provider Note ? ? ?CSN: [id number] ?Arrival date & time: 10/01/21  1026 ? ?  ? ?History ?Chief Complaint  ?Patient presents with  ? Knee Pain  ? ? ?Philip FittingOscar Gonzalez Philip Gonzalez is a 56 y.o. male presents to the ED for chronic left knee pain for the past 5-6 months. Denies any worsening of pain, he reports he is just "tired of dealing with it". The patient reports that he has had problems with the knee since he got out of the [employer] in 1991. Denies any new injury to the knee.  He reports that he quit his job because he had to stand a lot which was causing him pain. He also complains of black toenails for the past 3-4 months.  Denies any injury to the feet or toes. Denies any numbness/tingling.  Additionally, he complains of difficulty voiding that has been going on for 3-4 weeks. He complains of dribbling afterwards and having a penile discharge of a different color. Reports urinary urgency, frequency. Complains of bilateral testicle pain as well. Reports some left testicular swelling. Denies any scrotal swelling or pain. Denies any abdominal pain or back pain. Denies fever, chest pain, or SOB. Denies any vision problems. He reports he is concerned for STDs.  ? ? ?Knee Pain ?Associated symptoms: no fever   ? ?  ? ?Home Medications ?Prior to Admission medications   ?Medication Sig Start Date End Date Taking? Authorizing Provider  ?benzonatate (TESSALON) 100 MG capsule Take 1 capsule (100 mg total) by mouth every 8 (eight) hours. 10/31/18   Felicie MornSmith, David, NP  ?doxycycline (VIBRAMYCIN) 100 MG capsule Take 1 capsule (100 mg total) by mouth 2 (two) times daily. 07/31/18   Elson AreasSofia, Leslie K, PA-C  ?hydrochlorothiazide (MICROZIDE) 12.5 MG capsule Take 1 capsule (12.5 mg total) by mouth daily. 07/23/16   Adonis BrookAgustin, Sheila, NP  ?hydrOXYzine (ATARAX/VISTARIL) 25 MG tablet Take 1 tablet (25 mg total) by mouth every 6 (six) hours as needed for itching. 12/09/18   Long, Arlyss RepressJoshua G, MD  ?ibuprofen (ADVIL,MOTRIN) 600 MG  tablet Take 1 tablet (600 mg total) by mouth every 6 (six) hours as needed. 07/31/18   Elson AreasSofia, Leslie K, PA-C  ?naproxen (NAPROSYN) 500 MG tablet Take 1 tablet (500 mg total) by mouth 2 (two) times daily with a meal. 10/01/21   Achille Richansom, Ahilyn Nell, PA-C  ?   ? ?Allergies    ?Bee venom and Poison oak extract [poison oak extract]   ? ?Review of Systems   ?Review of Systems  ?Constitutional:  Negative for chills and fever.  ?Respiratory:  Negative for shortness of breath.   ?Cardiovascular:  Negative for chest pain.  ?Gastrointestinal:  Negative for abdominal pain, diarrhea, nausea and vomiting.  ?Genitourinary:  Positive for dysuria, hematuria and testicular pain.  ?Musculoskeletal:  Positive for arthralgias.  ? ?Physical Exam ?Updated Vital Signs ?BP (!) 140/91   Pulse 79   Temp 97.9 ?F (36.6 ?C)   Resp 18   Ht 5\' 6"  (1.676 m)   Wt 90.7 kg   SpO2 100%   BMI 32.28 kg/m?  ?Physical Exam ?Vitals and nursing note reviewed. Exam conducted with a chaperone present Marylene Land(Angela, RN).  ?Constitutional:   ?   General: He is not in acute distress. ?   Appearance: Normal appearance. He is not ill-appearing or toxic-appearing.  ?   Comments: On cell phone, in no acute distress.  ?HENT:  ?   Head: Normocephalic and atraumatic.  ?Eyes:  ?   General:  No scleral icterus. ?Cardiovascular:  ?   Rate and Rhythm: Normal rate and regular rhythm.  ?Pulmonary:  ?   Effort: Pulmonary effort is normal. No respiratory distress.  ?   Breath sounds: Normal breath sounds.  ?Abdominal:  ?   General: Bowel sounds are normal.  ?   Palpations: Abdomen is soft.  ?   Tenderness: There is no abdominal tenderness. There is no guarding or rebound.  ?   Hernia: There is no hernia in the left inguinal area or right inguinal area.  ?Genitourinary: ?   Penis: Circumcised. No tenderness, discharge or swelling.   ?   Testes:     ?   Right: Tenderness or swelling not present.     ?   Left: Tenderness present. Swelling not present.  ?   Epididymis:  ?   Right: Normal.  ?    Left: Normal.  ?   Comments: Circumcised male with no penile swelling or lesions.  No penile drip.  Left testicular tenderness, no right testicular tenderness.  No scrotal swelling noted.  No masses.  No hernias or lymphadenopathy palpated.  No swelling to the scrotal sac or any induration or fluctuance palpated. ?Musculoskeletal:     ?   General: Tenderness present. No deformity. Normal range of motion.  ?   Cervical back: Normal range of motion.  ?   Right lower leg: No edema.  ?   Left lower leg: No edema.  ?   Comments: Patient has full range of motion through the left knee with pain.  Tenderness to the more medial aspect of the left knee.  There is no overlying erythema or warmth.  No swelling noted.  No other overlying skin changes noted.  He has palpable DP and PT pulses.  Compartments are soft.  Patient does have bilateral great toenail discoloration that is nontender to touch.  Cap refills less than 2 seconds in his great toes and other little toes.  He is ambulatory with ease.  ?Lymphadenopathy:  ?   Lower Body: No right inguinal adenopathy. No left inguinal adenopathy.  ?Skin: ?   General: Skin is warm and dry.  ?Neurological:  ?   General: No focal deficit present.  ?   Mental Status: He is alert. Mental status is at baseline.  ? ? ? ? ? ?ED Results / Procedures / Treatments   ?Labs ?(all labs ordered are listed, but only abnormal results are displayed) ?Labs Reviewed  ?CBC WITH DIFFERENTIAL/PLATELET - Abnormal; Notable for the following components:  ?    Result Value  ? RBC 4.20 (*)   ? MCV 101.9 (*)   ? All other components within normal limits  ?COMPREHENSIVE METABOLIC PANEL - Abnormal; Notable for the following components:  ? BUN 24 (*)   ? Calcium 8.6 (*)   ? All other components within normal limits  ?URINE CULTURE  ?URINALYSIS, ROUTINE W REFLEX MICROSCOPIC  ?HIV ANTIBODY (ROUTINE TESTING W REFLEX)  ?RPR  ?GC/CHLAMYDIA PROBE AMP ([place]) NOT AT [facility]  ? ? ?EKG ?None ? ?Radiology ?DG Knee  Complete 4 Views Left ? ?Result Date: 10/01/2021 ?CLINICAL DATA:  Chronic LEFT knee pain, no known injury EXAM: LEFT KNEE - COMPLETE 4+ VIEW COMPARISON:  None FINDINGS: Osseous mineralization normal. Small spurs at articular margin of patella. Joint spaces fairly well preserved. Large joint effusion. No acute fracture, dislocation, or bone destruction. Patellar enthesophytes noted at quadriceps and patellar tendon insertions. 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 No acute osseous abnormalities. Electronically Signed   By: Ulyses Southward M.D.   On: 10/01/2021 14:00  ? ?US SCROTUM W/DOPPLER ? ?Result Date: 10/01/2021 ?CLINICAL DATA:  Left-sided testicular pain for 2 or 3 months. EXAM: SCROTAL ULTRASOUND DOPPLER ULTRASOUND OF THE TESTICLES TECHNIQUE: Complete ultrasound examination of the testicles, epididymis, and other scrotal structures was performed. Color and spectral Doppler ultrasound were also utilized to evaluate blood flow to the testicles. COMPARISON:  None Available. FINDINGS: Right testicle Measurements: 3.8 x 1.9 x 2.8 cm. Homogeneous echogenicity. Normal blood flow. No mass or microlithiasis visualized. Left testicle Measurements: 4.2 x 1.7 x 2.8 cm. Homogeneous echogenicity. Normal blood flow. No mass or microlithiasis visualized. Right epididymis:  Normal in size and appearance. Left epididymis:  Normal in size and appearance. Hydrocele:  Trace bilateral. Varicocele:  Borderline on the right, less than 3 mm with Valsalva. Pulsed Doppler interrogation of both testes demonstrates normal low resistance arterial and venous waveforms bilaterally. IMPRESSION: 1. Borderline right-sided varicocele. 2. Trace bilateral hydroceles. 3. Normal sonographic appearance of the testis and epididymi. No explanation for left-sided pain. Electronically Signed   By: Narda Rutherford M.D.   On: 10/01/2021 15:17   ? ?Procedures ?Procedures  ? ?Medications Ordered in ED ?Medications  ?cefTRIAXone  (ROCEPHIN) injection 500 mg (500 mg Intramuscular Given 10/01/21 1407)  ?metroNIDAZOLE (FLAGYL) tablet 2,000 mg (2,000 mg Oral Given 10/01/21 1407)  ?azithromycin (ZITHROMAX) tablet 1,000 mg (1,000 mg Oral Given 5/5/2

## 2021-10-01 NOTE — ED Notes (Signed)
ED Provider at bedside. 

## 2021-10-02 LAB — RPR: RPR Ser Ql: NONREACTIVE

## 2021-10-03 LAB — URINE CULTURE: Culture: <10000 — AB

## 2021-10-04 ENCOUNTER — Telehealth: Payer: Self-pay | Admitting: Orthopedic Surgery

## 2021-10-04 LAB — GC/CHLAMYDIA PROBE AMP (~~LOC~~) NOT AT ARMC
Chlamydia: NEGATIVE
Comment: NEGATIVE
Comment: NORMAL
Neisseria Gonorrhea: NEGATIVE

## 2021-10-04 NOTE — Telephone Encounter (Signed)
-----   Message from Rickard Rhymes, [place] sent at 10/04/2021  9:21 AM EDT ----- ?Left message for patient to call the office ?----- Message ----- ?From: Vickki Hearing, MD ?Sent: 10/03/2021   5:15 PM EDT ?To: Waldon Reining, [place], [address] ? [address] ? ? ?

## 2021-10-04 NOTE — Telephone Encounter (Signed)
Patient was called at 9:21am to offer this appointment; per Dr Romeo Apple; message was left.  Patient called back, and in process of offerint the visit for 10/13/21, patient either hung up or we lost connections. Will keep trying back. (Patient was asking quesitons about our office filling other prescriptions, not related to orthopaedics, to which I relayed we can treat only for the orthopaedic (knee) issue. ?

## 2021-10-08 ENCOUNTER — Encounter: Payer: Self-pay | Admitting: Orthopedic Surgery

## 2021-10-08 NOTE — Telephone Encounter (Signed)
Letter sent to patient offering appointment as noted as unable to re-reach by phone. ?

## 2021-11-15 DIAGNOSIS — R69 Illness, unspecified: Secondary | ICD-10-CM | POA: Diagnosis not present

## 2021-11-22 DIAGNOSIS — R69 Illness, unspecified: Secondary | ICD-10-CM | POA: Diagnosis not present

## 2021-12-14 DIAGNOSIS — R69 Illness, unspecified: Secondary | ICD-10-CM | POA: Diagnosis not present

## 2021-12-15 DIAGNOSIS — R69 Illness, unspecified: Secondary | ICD-10-CM | POA: Diagnosis not present

## 2021-12-16 DIAGNOSIS — R69 Illness, unspecified: Secondary | ICD-10-CM | POA: Diagnosis not present

## 2022-01-19 ENCOUNTER — Ambulatory Visit: Payer: No Typology Code available for payment source | Admitting: Family Medicine

## 2022-01-21 ENCOUNTER — Emergency Department (HOSPITAL_COMMUNITY)
Admission: EM | Admit: 2022-01-21 | Discharge: 2022-01-21 | Disposition: A | Discharge status: Home / Self Care | Payer: Medicaid Other | Attending: Emergency Medicine | Admitting: Emergency Medicine

## 2022-01-21 ENCOUNTER — Other Ambulatory Visit: Payer: Self-pay

## 2022-01-21 ENCOUNTER — Encounter (HOSPITAL_COMMUNITY): Payer: Self-pay | Admitting: Emergency Medicine

## 2022-01-21 DIAGNOSIS — H1011 Acute atopic conjunctivitis, right eye: Secondary | ICD-10-CM | POA: Insufficient documentation

## 2022-01-21 DIAGNOSIS — H1033 Unspecified acute conjunctivitis, bilateral: Secondary | ICD-10-CM | POA: Diagnosis not present

## 2022-01-21 DIAGNOSIS — Z79899 Other long term (current) drug therapy: Secondary | ICD-10-CM | POA: Diagnosis not present

## 2022-01-21 DIAGNOSIS — H1013 Acute atopic conjunctivitis, bilateral: Secondary | ICD-10-CM

## 2022-01-21 DIAGNOSIS — Z202 Contact with and (suspected) exposure to infections with a predominantly sexual mode of transmission: Secondary | ICD-10-CM | POA: Insufficient documentation

## 2022-01-21 DIAGNOSIS — I10 Essential (primary) hypertension: Secondary | ICD-10-CM | POA: Insufficient documentation

## 2022-01-21 DIAGNOSIS — H1012 Acute atopic conjunctivitis, left eye: Secondary | ICD-10-CM | POA: Insufficient documentation

## 2022-01-21 DIAGNOSIS — R300 Dysuria: Secondary | ICD-10-CM | POA: Diagnosis not present

## 2022-01-21 LAB — URINALYSIS, ROUTINE W REFLEX MICROSCOPIC
Bilirubin Urine: NEGATIVE
Glucose, UA: NEGATIVE mg/dL
Hgb urine dipstick: NEGATIVE
Ketones, ur: NEGATIVE mg/dL
Leukocytes,Ua: NEGATIVE
Nitrite: NEGATIVE
Protein, ur: NEGATIVE mg/dL
Specific Gravity, Urine: 1.023 (ref 1.005–1.030)
pH: 5 (ref 5.0–8.0)

## 2022-01-21 LAB — RAPID HIV SCREEN (HIV 1/2 AB+AG)
HIV 1/2 Antibodies: NONREACTIVE
HIV-1 P24 Antigen - HIV24: NONREACTIVE

## 2022-01-21 MED ORDER — AZITHROMYCIN 250 MG PO TABS
1000.0000 mg | ORAL_TABLET | Freq: Once | ORAL | Status: AC
  Administered 2022-01-21: 1000 mg via ORAL
  Filled 2022-01-21: qty 4

## 2022-01-21 MED ORDER — CEFTRIAXONE SODIUM 500 MG IJ SOLR
500.0000 mg | Freq: Once | INTRAMUSCULAR | Status: AC
  Administered 2022-01-21: 500 mg via INTRAMUSCULAR
  Filled 2022-01-21: qty 500

## 2022-01-21 MED ORDER — METRONIDAZOLE 500 MG PO TABS
2000.0000 mg | ORAL_TABLET | Freq: Once | ORAL | Status: AC
Start: 2022-01-21 — End: 2022-01-21
  Administered 2022-01-21: 2000 mg via ORAL
  Filled 2022-01-21: qty 4

## 2022-01-21 MED ORDER — LORATADINE 10 MG PO TABS: 10.0000 mg | ORAL_TABLET | Freq: Every day | ORAL | 0 refills | Status: AC

## 2022-01-21 NOTE — ED Provider Notes (Signed)
[facility] EMERGENCY DEPARTMENT Provider Note   CSN: [id number] Arrival date & time: 01/21/22  1012     History Chief Complaint  Patient presents with   Dysuria    Philip MEIRING is a 56 y.o. male with h/o HTN, PTSD presents to the ED for evaluation of dysuria and occasional penile discharge for the past 2 weeks.  Patient reports that he has been having unprotected sex and is concerned for STDs.  Denies any rash.  Denies any abdominal pain, nausea, vomiting, flank pain, fevers, hematuria.  Nursing note mentions flank pain however patient denies this to me.  Denies any testicular, scrotal, or penile pain or swelling.  Additionally, he mentions he has been having some bilateral eye crusting in the morning when he wakes up with an occasional runny nose for the past few months.  No medications have been trialed for this.  Denies any blurry vision or headaches.  Denies any eye pain.   Dysuria Presenting symptoms: dysuria and penile discharge   Presenting symptoms: no penile pain   Associated symptoms: no abdominal pain, no diarrhea, no fever, no flank pain, no nausea, no penile swelling, no scrotal swelling, no urinary frequency and no vomiting        Home Medications Prior to Admission medications   Medication Sig Start Date End Date Taking? Authorizing Provider  benzonatate (TESSALON) 100 MG capsule Take 1 capsule (100 mg total) by mouth every 8 (eight) hours. 10/31/18   Felicie Morn, NP  doxycycline (VIBRAMYCIN) 100 MG capsule Take 1 capsule (100 mg total) by mouth 2 (two) times daily. 07/31/18   Elson Areas, PA-C  hydrochlorothiazide (MICROZIDE) 12.5 MG capsule Take 1 capsule (12.5 mg total) by mouth daily. 07/23/16   Adonis Brook, NP  hydrOXYzine (ATARAX/VISTARIL) 25 MG tablet Take 1 tablet (25 mg total) by mouth every 6 (six) hours as needed for itching. 12/09/18   Long, Arlyss Repress, MD  ibuprofen (ADVIL,MOTRIN) 600 MG tablet Take 1 tablet (600 mg total) by mouth every 6 (six) hours  as needed. 07/31/18   Elson Areas, PA-C  naproxen (NAPROSYN) 500 MG tablet Take 1 tablet (500 mg total) by mouth 2 (two) times daily with a meal. 10/01/21   Achille Rich, PA-C      Allergies    Bee venom and Poison oak extract [poison oak extract]    Review of Systems   Review of Systems  Constitutional:  Negative for chills and fever.  Eyes:  Positive for discharge. Negative for photophobia, pain and redness.  Respiratory:  Negative for shortness of breath.   Cardiovascular:  Negative for chest pain.  Gastrointestinal:  Negative for abdominal pain, constipation, diarrhea, nausea and vomiting.  Genitourinary:  Positive for dysuria and penile discharge. Negative for flank pain, frequency, penile pain, penile swelling, scrotal swelling, testicular pain and urgency.  Musculoskeletal:  Negative for back pain and joint swelling.    Physical Exam Updated Vital Signs BP 130/88 (BP Location: Right Arm)   Pulse 84   Temp 98.5 F (36.9 C) (Oral)   Resp 15   SpO2 97%  Physical Exam Vitals and nursing note reviewed. Exam conducted with a chaperone present Neysa Bonito , Charity fundraiser).  Constitutional:      General: He is not in acute distress.    Appearance: Normal appearance. He is not ill-appearing or toxic-appearing.  Eyes:     General: No scleral icterus.       Right eye: No discharge.  Left eye: No discharge.     Extraocular Movements: Extraocular movements intact.     Conjunctiva/sclera: Conjunctivae normal.     Pupils: Pupils are equal, round, and reactive to light.     Comments: No crusting or discharge noted bilaterally.  Conjunctival normal  Cardiovascular:     Rate and Rhythm: Normal rate.  Pulmonary:     Effort: Pulmonary effort is normal. No respiratory distress.  Abdominal:     General: Bowel sounds are normal.     Palpations: Abdomen is soft.     Tenderness: There is no abdominal tenderness. There is no right CVA tenderness, left CVA tenderness, guarding or rebound.      Hernia: There is no hernia in the left inguinal area or right inguinal area.     Comments: No abdominal tenderness.  Abdomen soft without any guarding or rebound.  No CVA tenderness bilaterally.  Genitourinary:    Pubic Area: No rash.      Penis: Normal.      Testes: Normal.        Right: Tenderness or swelling not present.        Left: Tenderness or swelling not present.     Epididymis:     Right: Normal.     Left: Normal.  Skin:    General: Skin is dry.     Findings: No rash.  Neurological:     General: No focal deficit present.     Mental Status: He is alert. Mental status is at baseline.  Psychiatric:        Mood and Affect: Mood normal.     ED Results / Procedures / Treatments   Labs (all labs ordered are listed, but only abnormal results are displayed) Labs Reviewed  URINALYSIS, ROUTINE W REFLEX MICROSCOPIC    EKG None  Radiology No results found.  Procedures Procedures   Medications Ordered in ED Medications - No data to display  ED Course/ Medical Decision Making/ A&P                           Medical Decision Making Amount and/or Complexity of Data Reviewed Labs: ordered.  Risk Prescription drug management.   56 y/o M presents to the ER for evaluation of his dysuria, penile discharge, and bilateral eye crusting.  Differential diagnosis includes but is not limited to allergic conjunctivitis, bacterial conjunctivitis, blepharitis, interstitial cystitis, STD, prostatitis.  Vital signs are unremarkable.  Physical exam as noted above.  On previous chart review, the patient was seen here in May for similar symptoms. His STD testing was negative. He was recommended to follow up with a urologist, but he never did.  He was seen in February for this as well.  I independently reviewed and interpreted the patient's labs.  Urinalysis is otherwise negative.  Clear, yellow urine without any ketones, protein, leukocytes.  No reflex ordered because of this.  RPR, HIV,  and GC pending.    Patient opted for empiric treatment today for STDs which included azithromycin, Rocephin, and Flagyl.  I do not see any need for any antibiotic drops for his eyes.  Will recommend warm compresses and lubricating eyedrops as well as adding Claritin.  I asked the patient to follow-up with his MyChart to check for his RPR, HIV, and GC results.  Recommended abstinence for the next 2 weeks until these results have posted to avoid spread or possible reinfection.  Safe sex information given.  I stressed again  the importance of following with a urologist for this problem as I do not think that it is an STD problem or any emergent urological problem however he does need to follow-up with urologist.  We discussed return precautions and red flag symptoms.  Patient verbalized understanding and agrees to the plan.  Patient is stable and being discharged home in good condition.  Final Clinical Impression(s) / ED Diagnoses Final diagnoses:  Exposure to STD  Allergic conjunctivitis of both eyes    Rx / DC Orders ED Discharge Orders     None         Achille Rich, Cordelia Poche 01/21/22 1228    Bethann Berkshire, MD 01/21/22 [phone]

## 2022-01-21 NOTE — Discharge Instructions (Signed)
You were seen in the emergency [facility] for evaluation of your pain with urination and penile discharge as well as your crusting of your eyes.  For your eyes, I recommend warm compresses in the morning.  Have sent you in a prescription for Claritin for you to take daily for these allergy symptoms.  You can also try lubricating eyedrops.  You can follow-up with your primary care doctor.  For your penile discharge and pain with urination, there is no signs of infection seen in your urine.  As previously discussed, I would like for you to follow-up with a urologist.  I have included Dr. Laverle Hobby information to this discharge paperwork.  Please call to schedule an appointment to follow-up with him or [facility] with your [facility].  If you have any concerns, new or worsening symptoms, please return to the nearest emergency department for evaluation.  Contact a health care provider if: You have a fever. You develop pain in your back or sides. You have nausea or vomiting. You have blood in your urine. You are not urinating as often as you usually do. Get help right away if: Your pain is severe and not relieved with medicines. You cannot eat or drink without vomiting. You are confused. You have a rapid heartbeat while resting. You have shaking or chills. You feel extremely weak.

## 2022-01-21 NOTE — ED Triage Notes (Addendum)
Pt c/o burning with urination x 2 weeks with clear white d/c and itching in groin area. C/o left lower flank area pain. Nad. Pt also c/o green exudate from eyes x 2-3 months daily.

## 2022-01-22 LAB — RPR: RPR Ser Ql: NONREACTIVE

## 2022-01-24 LAB — GC/CHLAMYDIA PROBE AMP (~~LOC~~) NOT AT ARMC
Chlamydia: NEGATIVE
Comment: NEGATIVE
Comment: NORMAL
Neisseria Gonorrhea: NEGATIVE

## 2022-02-07 ENCOUNTER — Encounter (HOSPITAL_COMMUNITY): Payer: Self-pay | Admitting: *Deleted

## 2022-02-07 ENCOUNTER — Emergency Department (HOSPITAL_COMMUNITY): Payer: No Typology Code available for payment source

## 2022-02-07 ENCOUNTER — Emergency Department (HOSPITAL_COMMUNITY)
Admission: EM | Admit: 2022-02-07 | Discharge: 2022-02-07 | Disposition: A | Discharge status: Home / Self Care | Payer: No Typology Code available for payment source | Attending: Emergency Medicine | Admitting: Emergency Medicine

## 2022-02-07 ENCOUNTER — Other Ambulatory Visit: Payer: Self-pay

## 2022-02-07 DIAGNOSIS — I10 Essential (primary) hypertension: Secondary | ICD-10-CM | POA: Insufficient documentation

## 2022-02-07 DIAGNOSIS — Z20822 Contact with and (suspected) exposure to covid-19: Secondary | ICD-10-CM | POA: Diagnosis not present

## 2022-02-07 DIAGNOSIS — R059 Cough, unspecified: Secondary | ICD-10-CM | POA: Diagnosis present

## 2022-02-07 DIAGNOSIS — E119 Type 2 diabetes mellitus without complications: Secondary | ICD-10-CM | POA: Diagnosis not present

## 2022-02-07 DIAGNOSIS — J069 Acute upper respiratory infection, unspecified: Secondary | ICD-10-CM | POA: Insufficient documentation

## 2022-02-07 DIAGNOSIS — Z79899 Other long term (current) drug therapy: Secondary | ICD-10-CM | POA: Diagnosis not present

## 2022-02-07 LAB — SARS CORONAVIRUS 2 BY RT PCR: SARS Coronavirus 2 by RT PCR: NEGATIVE

## 2022-02-07 NOTE — ED Provider Notes (Signed)
[facility] EMERGENCY DEPARTMENT Provider Note   CSN: [id number] Arrival date & time: 02/07/22  1108     History  Chief Complaint  Patient presents with   Generalized Body Aches    Philip Gonzalez is a 56 y.o. male with a PMHx of DM, HTN, who presents to the ED with concerns for generalized body aches onset 3-4 days. Denies sick contacts. Has associated cough, rhinorrhea, nasal congestion, subjective fever, headache. Tried OTC tylenol and ibuprofen without relief of his symptoms.   The history is provided by the patient. No language interpreter was used.       Home Medications Prior to Admission medications   Medication Sig Start Date End Date Taking? Authorizing Provider  benzonatate (TESSALON) 100 MG capsule Take 1 capsule (100 mg total) by mouth every 8 (eight) hours. 10/31/18   Felicie Morn, NP  doxycycline (VIBRAMYCIN) 100 MG capsule Take 1 capsule (100 mg total) by mouth 2 (two) times daily. 07/31/18   Elson Areas, PA-C  hydrochlorothiazide (MICROZIDE) 12.5 MG capsule Take 1 capsule (12.5 mg total) by mouth daily. 07/23/16   Adonis Brook, NP  hydrOXYzine (ATARAX/VISTARIL) 25 MG tablet Take 1 tablet (25 mg total) by mouth every 6 (six) hours as needed for itching. 12/09/18   Long, Arlyss Repress, MD  ibuprofen (ADVIL,MOTRIN) 600 MG tablet Take 1 tablet (600 mg total) by mouth every 6 (six) hours as needed. 07/31/18   Elson Areas, PA-C  loratadine (CLARITIN) 10 MG tablet Take 1 tablet (10 mg total) by mouth daily. 01/21/22   Achille Rich, PA-C  naproxen (NAPROSYN) 500 MG tablet Take 1 tablet (500 mg total) by mouth 2 (two) times daily with a meal. 10/01/21   Achille Rich, PA-C      Allergies    Bee venom and Poison oak extract [poison oak extract]    Review of Systems   Review of Systems  Constitutional:  Positive for fever (subjective).  HENT:  Positive for congestion and rhinorrhea.   Respiratory:  Positive for cough.   Musculoskeletal:  Positive for myalgias.   Neurological:  Positive for headaches.  All other systems reviewed and are negative.   Physical Exam Updated Vital Signs BP 122/82   Pulse 98   Temp 97.6 F (36.4 C) (Oral)   Resp 16   Ht 5\' 6"  (1.676 m)   Wt 86.2 kg   SpO2 93%   BMI 30.67 kg/m  Physical Exam Vitals and nursing note reviewed.  Constitutional:      General: He is not in acute distress.    Appearance: He is not diaphoretic.  HENT:     Head: Normocephalic and atraumatic.     Mouth/Throat:     Pharynx: No oropharyngeal exudate.  Eyes:     General: No scleral icterus.    Conjunctiva/sclera: Conjunctivae normal.  Cardiovascular:     Rate and Rhythm: Normal rate and regular rhythm.     Pulses: Normal pulses.     Heart sounds: Normal heart sounds.  Pulmonary:     Effort: Pulmonary effort is normal. No respiratory distress.     Breath sounds: Normal breath sounds. No wheezing.  Abdominal:     General: Bowel sounds are normal.     Palpations: Abdomen is soft. There is no mass.     Tenderness: There is no abdominal tenderness. There is no guarding or rebound.  Musculoskeletal:        General: Normal range of motion.     Cervical back:  Normal range of motion and neck supple.  Skin:    General: Skin is warm and dry.  Neurological:     Mental Status: He is alert.  Psychiatric:        Behavior: Behavior normal.     ED Results / Procedures / Treatments   Labs (all labs ordered are listed, but only abnormal results are displayed) Labs Reviewed  SARS CORONAVIRUS 2 BY RT PCR    EKG None  Radiology DG Chest Portable 1 View  Result Date: 02/07/2022 CLINICAL DATA:  Cough, fever. EXAM: PORTABLE CHEST 1 VIEW COMPARISON:  October 31, 2018. FINDINGS: The heart size and mediastinal contours are within normal limits. Both lungs are clear. The visualized skeletal structures are unremarkable. IMPRESSION: No active disease. Electronically Signed   By: Lupita Raider M.D.   On: 02/07/2022 13:17     Procedures Procedures    Medications Ordered in ED Medications - No data to display  ED Course/ Medical Decision Making/ A&P Clinical Course as of 02/07/22 2253  Mon Feb 07, 2022  1433 SARS Coronavirus 2 by RT PCR: NEGATIVE Re-evaluated discussed discharge treatment plan. Pt agreeable at this time. Pt appears safe for discharge. [SB]    Clinical Course User Index [SB] Nathasha Fiorillo A, PA-C                           Medical Decision Making Amount and/or Complexity of Data Reviewed Labs:  Decision-making details documented in ED Course.   Pt presents with concerns for generalized body aches, rhinorrhea, nasal congestion onset 3-4 days. Unsure of sick contacts. Tried OTC tylenol and ibuprofen without relief. Vital signs, pt afebrile. On exam, pt with no acute cardiovascular, respiratory, abdominal exam findings. Differential diagnosis includes COVID, flu, PNA, viral URI with cough.   Labs:  I ordered, and personally interpreted labs.  The pertinent results include:   COVID swab negative  Imaging: I ordered imaging studies including CXR I independently visualized and interpreted imaging which showed: no acute cardiopulmonary findings I agree with the radiologist interpretation  Disposition: Pt presentation suspicious for viral URI with cough.  Doubt COVID-19, pneumonia at this time.  After consideration of the diagnostic results and the patients response to treatment, I feel that the patient would benefit from Discharge home. Work note provided.  Supportive care measures and strict return precautions discussed with patient at bedside. Pt acknowledges and verbalizes understanding. Pt appears safe for discharge. Follow up as indicated in discharge paperwork.    This chart was dictated using voice recognition software, Dragon. Despite the best efforts of this provider to proofread and correct errors, errors may still occur which can change documentation meaning.   Final Clinical  Impression(s) / ED Diagnoses Final diagnoses:  Viral URI with cough    Rx / DC Orders ED Discharge Orders     None         Taeshaun Rames A, PA-C 02/07/22 2254    Glyn Ade, MD 02/08/22 [phone]

## 2022-02-07 NOTE — ED Triage Notes (Signed)
Pt c/o generalized body aches with headache and fever x 2 days

## 2022-02-07 NOTE — Discharge Instructions (Addendum)
It was a pleasure taking care of you!   Your COVID swab was negative and your chest xray didn't show any concerning findings. You may take over-the-counter cough and cold medications as needed for your symptoms. Ensure to maintain fluid intake with tea, soup, broth, Pedialyte, Gatorade, water. You may follow-up with your primary care provider as needed.  Return to the Emergency Department if you are experiencing trouble breathing, chest pain, decreased fluid intake or worsening symptoms.

## 2022-02-19 DIAGNOSIS — N342 Other urethritis: Secondary | ICD-10-CM | POA: Diagnosis not present

## 2022-02-21 ENCOUNTER — Telehealth: Payer: Self-pay | Admitting: Licensed Clinical Social Worker

## 2022-02-21 ENCOUNTER — Telehealth: Payer: Self-pay

## 2022-02-21 NOTE — Patient Outreach (Signed)
Care Coordination  02/21/2022  Philip Gonzalez 04/10/1966 [id number]   Transition Care Management Unsuccessful Follow-up Telephone Call  Date of discharge and from where:  02/19/22 [facility]   Attempts:  1st Attempt  Reason for unsuccessful TCM follow-up call:  Unable to leave message   Mickel Fuchs, BSW, [place] Medicaid Team  [phone]

## 2022-02-21 NOTE — Patient Outreach (Signed)
  Care Coordination [place] Note Transition Care Management Unsuccessful Follow-up Telephone Call  Date of discharge and from where:  02/19/22 from [facility] ED.  Attempts:  2nd Attempt  Reason for unsuccessful TCM follow-up call:  Unable to leave message  Eula Fried, BSW, MSW, [employer] Managed Medicaid [email or web address] Phone: [phone]

## 2022-03-15 ENCOUNTER — Encounter (HOSPITAL_COMMUNITY): Payer: Self-pay

## 2022-03-15 ENCOUNTER — Emergency Department (HOSPITAL_COMMUNITY)
Admission: EM | Admit: 2022-03-15 | Discharge: 2022-03-15 | Discharge status: Against Medical Advice | Payer: No Typology Code available for payment source | Attending: Emergency Medicine | Admitting: Emergency Medicine

## 2022-03-15 ENCOUNTER — Other Ambulatory Visit: Payer: Self-pay

## 2022-03-15 DIAGNOSIS — R531 Weakness: Secondary | ICD-10-CM | POA: Insufficient documentation

## 2022-03-15 DIAGNOSIS — Z5321 Procedure and treatment not carried out due to patient leaving prior to being seen by health care provider: Secondary | ICD-10-CM | POA: Diagnosis not present

## 2022-03-15 DIAGNOSIS — R197 Diarrhea, unspecified: Secondary | ICD-10-CM | POA: Diagnosis present

## 2022-03-15 DIAGNOSIS — R059 Cough, unspecified: Secondary | ICD-10-CM | POA: Diagnosis not present

## 2022-03-15 LAB — CBC
HCT: 43.1 % (ref 39.0–52.0)
Hemoglobin: 13.7 g/dL (ref 13.0–17.0)
MCH: 32.2 pg (ref 26.0–34.0)
MCHC: 31.8 g/dL (ref 30.0–36.0)
MCV: 101.2 fL — ABNORMAL HIGH (ref 80.0–100.0)
Platelets: 233 10*3/uL (ref 150–400)
RBC: 4.26 MIL/uL (ref 4.22–5.81)
RDW: 12.5 % (ref 11.5–15.5)
WBC: 6.5 10*3/uL (ref 4.0–10.5)
nRBC: 0 % (ref 0.0–0.2)

## 2022-03-15 LAB — COMPREHENSIVE METABOLIC PANEL
ALT: 17 U/L (ref 0–44)
AST: 23 U/L (ref 15–41)
Albumin: 4.2 g/dL (ref 3.5–5.0)
Alkaline Phosphatase: 80 U/L (ref 38–126)
Anion gap: 9 (ref 5–15)
BUN: 15 mg/dL (ref 6–20)
CO2: 28 mmol/L (ref 22–32)
Calcium: 8.9 mg/dL (ref 8.9–10.3)
Chloride: 103 mmol/L (ref 98–111)
Creatinine, Ser: 1.01 mg/dL (ref 0.61–1.24)
GFR, Estimated: >60 mL/min (ref >60)
Glucose, Bld: 80 mg/dL (ref 70–99)
Potassium: 4.1 mmol/L (ref 3.5–5.1)
Sodium: 140 mmol/L (ref 135–145)
Total Bilirubin: 0.8 mg/dL (ref 0.3–1.2)
Total Protein: 7.4 g/dL (ref 6.5–8.1)

## 2022-03-15 LAB — URINALYSIS, ROUTINE W REFLEX MICROSCOPIC
Bilirubin Urine: NEGATIVE
Glucose, UA: NEGATIVE mg/dL
Hgb urine dipstick: NEGATIVE
Ketones, ur: NEGATIVE mg/dL
Leukocytes,Ua: NEGATIVE
Nitrite: NEGATIVE
Protein, ur: NEGATIVE mg/dL
Specific Gravity, Urine: 1.02 (ref 1.005–1.030)
pH: 5 (ref 5.0–8.0)

## 2022-03-15 LAB — LIPASE, BLOOD: Lipase: 26 U/L (ref 11–51)

## 2022-03-15 NOTE — ED Triage Notes (Signed)
Pt reports not feeling well. Diarrhea. Cough, Weakness. Burning on urination. Pt reports that it is difficult to urinate. Pt reports all of these started a few days ago.

## 2022-03-16 ENCOUNTER — Telehealth: Payer: Self-pay | Admitting: Licensed Clinical Social Worker

## 2022-03-16 NOTE — Patient Outreach (Signed)
  Care Coordination [facility] Note Transition Care Management Follow-up Telephone Call Date of discharge and from where: 03/15/22 from [facility]  How have you been since you were released from the hospital? I am not doing well. I am going to the [facility] hospital today to get evaluated. Any questions or concerns? No  Items Reviewed: Did the pt receive and understand the discharge instructions provided?  No Medications obtained and verified?  No medications ordered Other? No  Any new allergies since your discharge? No  Dietary orders reviewed? No Do you have support at home? Yes   Home Care and Equipment/Supplies: Were home health services ordered? no If so, what is the name of the agency? na  Has the agency set up a time to come to the patient's home? not applicable Were any new equipment or medical supplies ordered?  No What is the name of the medical supply agency? na Were you able to get the supplies/equipment? not applicable Do you have any questions related to the use of the equipment or supplies? na  Functional Questionnaire: (I = Independent and D = Dependent) ADLs: I  Bathing/Dressing- I  Meal Prep- I  Eating- I  Maintaining continence- I  Transferring/Ambulation- I  Managing Meds- I  Follow up appointments reviewed:  PCP Hospital f/u appt confirmed? No  ED visit only but patient reported that he will go to [facility] hospital today. [facility] f/u appt confirmed? No   Are transportation arrangements needed? No  If their condition worsens, is the pt aware to call PCP or go to the Emergency Dept.? Yes Was the patient provided with contact information for the PCP's office or ED? Yes Was to pt encouraged to call back with questions or concerns? Yes  SDOH assessments and interventions completed:   No  Care Coordination Interventions Activated:  No   Care Coordination Interventions:  No Care Coordination interventions needed at this time.   Encounter Outcome:   Pt. Refused

## 2022-07-28 ENCOUNTER — Encounter: Payer: Self-pay | Admitting: Radiology

## 2022-11-13 ENCOUNTER — Encounter (HOSPITAL_COMMUNITY): Payer: Self-pay | Admitting: Emergency Medicine

## 2022-11-13 ENCOUNTER — Other Ambulatory Visit: Payer: Self-pay

## 2022-11-13 ENCOUNTER — Emergency Department (HOSPITAL_COMMUNITY)
Admission: EM | Admit: 2022-11-13 | Discharge: 2022-11-13 | Disposition: A | Discharge status: Home / Self Care | Payer: No Typology Code available for payment source | Attending: Emergency Medicine | Admitting: Emergency Medicine

## 2022-11-13 DIAGNOSIS — W5911XA Bitten by nonvenomous snake, initial encounter: Secondary | ICD-10-CM

## 2022-11-13 DIAGNOSIS — T63001A Toxic effect of unspecified snake venom, accidental (unintentional), initial encounter: Secondary | ICD-10-CM | POA: Insufficient documentation

## 2022-11-13 MED ORDER — CLOTRIMAZOLE-BETAMETHASONE 1-0.05 % EX CREA: TOPICAL_CREAM | CUTANEOUS | 0 refills | Status: AC

## 2022-11-13 NOTE — ED Provider Notes (Signed)
[place] EMERGENCY DEPARTMENT AT [facility] Provider Note   CSN: [id number] Arrival date & time: 11/13/22  0326     History  Chief Complaint  Patient presents with   Snake Bite    Philip Gonzalez is a 57 y.o. male.  Reports that he was bitten by a copperhead 3 days ago.       Home Medications Prior to Admission medications   Medication Sig Start Date End Date Taking? Authorizing Provider  benzonatate (TESSALON) 100 MG capsule Take 1 capsule (100 mg total) by mouth every 8 (eight) hours. 10/31/18   Felicie Morn, NP  doxycycline (VIBRAMYCIN) 100 MG capsule Take 1 capsule (100 mg total) by mouth 2 (two) times daily. 07/31/18   Elson Areas, PA-C  hydrochlorothiazide (MICROZIDE) 12.5 MG capsule Take 1 capsule (12.5 mg total) by mouth daily. 07/23/16   Adonis Brook, NP  hydrOXYzine (ATARAX/VISTARIL) 25 MG tablet Take 1 tablet (25 mg total) by mouth every 6 (six) hours as needed for itching. 12/09/18   Long, Arlyss Repress, MD  ibuprofen (ADVIL,MOTRIN) 600 MG tablet Take 1 tablet (600 mg total) by mouth every 6 (six) hours as needed. 07/31/18   Elson Areas, PA-C  loratadine (CLARITIN) 10 MG tablet Take 1 tablet (10 mg total) by mouth daily. 01/21/22   Achille Rich, PA-C  naproxen (NAPROSYN) 500 MG tablet Take 1 tablet (500 mg total) by mouth 2 (two) times daily with a meal. 10/01/21   Achille Rich, PA-C      Allergies    Bee venom and Poison oak extract [poison oak extract]    Review of Systems   Review of Systems  Physical Exam Updated Vital Signs BP (!) 133/91 (BP Location: Left Arm)   Pulse 85   Temp 97.7 F (36.5 C) (Oral)   Resp 16   Ht 5\' 6"  (1.676 m)   Wt 86.2 kg   SpO2 94%   BMI 30.67 kg/m  Physical Exam Vitals and nursing note reviewed.  Constitutional:      General: He is not in acute distress.    Appearance: He is well-developed.  HENT:     Head: Normocephalic and atraumatic.     Mouth/Throat:     Mouth: Mucous membranes are moist.  Eyes:      General: Vision grossly intact. Gaze aligned appropriately.     Extraocular Movements: Extraocular movements intact.     Conjunctiva/sclera: Conjunctivae normal.  Cardiovascular:     Rate and Rhythm: Normal rate and regular rhythm.     Pulses: Normal pulses.     Heart sounds: Normal heart sounds, S1 normal and S2 normal. No murmur heard.    No friction rub. No gallop.  Pulmonary:     Effort: Pulmonary effort is normal. No respiratory distress.     Breath sounds: Normal breath sounds.  Abdominal:     Palpations: Abdomen is soft.     Tenderness: There is no abdominal tenderness. There is no guarding or rebound.     Hernia: No hernia is present.  Musculoskeletal:        General: No swelling.     Cervical back: Full passive range of motion without pain, normal range of motion and neck supple. No pain with movement, spinous process tenderness or muscular tenderness. Normal range of motion.     Right lower leg: No edema.     Left lower leg: No edema.  Skin:    General: Skin is warm and dry.  Capillary Refill: Capillary refill takes less than 2 seconds.     Findings: No ecchymosis, erythema, lesion or wound.     Comments: No swelling, skin breakdown, erythema or abnormality on the left fifth toe, site where he reports he was bitten  Neurological:     Mental Status: He is alert and oriented to person, place, and time.     GCS: GCS eye subscore is 4. GCS verbal subscore is 5. GCS motor subscore is 6.     Cranial Nerves: Cranial nerves 2-12 are intact.     Sensory: Sensation is intact.     Motor: Motor function is intact. No weakness or abnormal muscle tone.     Coordination: Coordination is intact.  Psychiatric:        Mood and Affect: Mood normal.        Speech: Speech normal.        Behavior: Behavior normal.     ED Results / Procedures / Treatments   Labs (all labs ordered are listed, but only abnormal results are displayed) Labs Reviewed - No data to  display  EKG None  Radiology No results found.  Procedures Procedures    Medications Ordered in ED Medications - No data to display  ED Course/ Medical Decision Making/ A&P                             Medical Decision Making  Patient reports snakebite 3 days ago.  He thinks it was a copperhead.  There does not appear to be any evidence of envenomation on exam, area appears normal.  No signs of infection.  Patient concerned, reassured, no treatment necessary.        Final Clinical Impression(s) / ED Diagnoses Final diagnoses:  Snake bite, initial encounter    Rx / DC Orders ED Discharge Orders     None         Zyquan Crotty, Canary Brim, MD 11/13/22 [phone]

## 2022-11-13 NOTE — ED Triage Notes (Signed)
Pt states he was bit by a tan snake that he thinks is a copperhead on Thursday.

## 2022-11-13 NOTE — Discharge Instructions (Addendum)
The area appears to be fine.  There is no sign of problems from this possible bite.  You do not need any specific treatment.

## 2023-07-13 ENCOUNTER — Emergency Department (HOSPITAL_COMMUNITY)
Admission: EM | Admit: 2023-07-13 | Discharge: 2023-07-13 | Discharge status: Against Medical Advice | Payer: No Typology Code available for payment source | Attending: Emergency Medicine | Admitting: Emergency Medicine

## 2023-07-13 ENCOUNTER — Emergency Department (HOSPITAL_COMMUNITY): Payer: No Typology Code available for payment source

## 2023-07-13 ENCOUNTER — Encounter (HOSPITAL_COMMUNITY): Payer: Self-pay | Admitting: *Deleted

## 2023-07-13 ENCOUNTER — Emergency Department (HOSPITAL_COMMUNITY)
Admission: EM | Admit: 2023-07-13 | Discharge: 2023-07-14 | Disposition: A | Discharge status: Home / Self Care | Payer: No Typology Code available for payment source | Attending: Emergency Medicine | Admitting: Emergency Medicine

## 2023-07-13 ENCOUNTER — Other Ambulatory Visit: Payer: Self-pay

## 2023-07-13 ENCOUNTER — Encounter (HOSPITAL_COMMUNITY): Payer: Self-pay | Admitting: Emergency Medicine

## 2023-07-13 DIAGNOSIS — R079 Chest pain, unspecified: Secondary | ICD-10-CM | POA: Diagnosis present

## 2023-07-13 DIAGNOSIS — I10 Essential (primary) hypertension: Secondary | ICD-10-CM | POA: Diagnosis not present

## 2023-07-13 DIAGNOSIS — M25562 Pain in left knee: Secondary | ICD-10-CM | POA: Diagnosis not present

## 2023-07-13 DIAGNOSIS — G8929 Other chronic pain: Secondary | ICD-10-CM | POA: Insufficient documentation

## 2023-07-13 DIAGNOSIS — F17210 Nicotine dependence, cigarettes, uncomplicated: Secondary | ICD-10-CM | POA: Diagnosis not present

## 2023-07-13 DIAGNOSIS — Z5900 Homelessness unspecified: Secondary | ICD-10-CM | POA: Insufficient documentation

## 2023-07-13 DIAGNOSIS — Z79899 Other long term (current) drug therapy: Secondary | ICD-10-CM | POA: Diagnosis not present

## 2023-07-13 DIAGNOSIS — M25561 Pain in right knee: Secondary | ICD-10-CM | POA: Insufficient documentation

## 2023-07-13 LAB — CBC
HCT: 36.1 % — ABNORMAL LOW (ref 39.0–52.0)
Hemoglobin: 11.8 g/dL — ABNORMAL LOW (ref 13.0–17.0)
MCH: 31.7 pg (ref 26.0–34.0)
MCHC: 32.7 g/dL (ref 30.0–36.0)
MCV: 97 fL (ref 80.0–100.0)
Platelets: 216 10*3/uL (ref 150–400)
RBC: 3.72 MIL/uL — ABNORMAL LOW (ref 4.22–5.81)
RDW: 12.1 % (ref 11.5–15.5)
WBC: 5.7 10*3/uL (ref 4.0–10.5)
nRBC: 0 % (ref 0.0–0.2)

## 2023-07-13 LAB — BASIC METABOLIC PANEL
Anion gap: 9 (ref 5–15)
BUN: 10 mg/dL (ref 6–20)
CO2: 26 mmol/L (ref 22–32)
Calcium: 8.6 mg/dL — ABNORMAL LOW (ref 8.9–10.3)
Chloride: 103 mmol/L (ref 98–111)
Creatinine, Ser: 1.01 mg/dL (ref 0.61–1.24)
GFR, Estimated: >60 mL/min (ref >60)
Glucose, Bld: 123 mg/dL — ABNORMAL HIGH (ref 70–99)
Potassium: 3.7 mmol/L (ref 3.5–5.1)
Sodium: 138 mmol/L (ref 135–145)

## 2023-07-13 LAB — TROPONIN I (HIGH SENSITIVITY)
Troponin I (High Sensitivity): <2 ng/L (ref <18)
Troponin I (High Sensitivity): <2 ng/L (ref <18)

## 2023-07-13 NOTE — ED Triage Notes (Signed)
Patient complaining of bilateral knee pain. Patient states left knee pain is chronic but right knee has been hurting for 1 week. Denies recent injuries.

## 2023-07-13 NOTE — ED Notes (Signed)
Pt provided with food and drink per EDP

## 2023-07-13 NOTE — ED Triage Notes (Signed)
The pt reports that he has had chest pain today  he is c/o some sob nausea no previous history

## 2023-07-13 NOTE — ED Provider Notes (Signed)
[place] EMERGENCY DEPARTMENT AT [facility] Provider Note   CSN: [id number] Arrival date & time: 07/13/23  4098     History  Chief Complaint  Patient presents with   Chest Pain    Philip Gonzalez is a 58 y.o. male.  Patient here with some chest discomfort.  Nothing makes it worse or better.  Just short episode today.  No history of chest pain.  Does not have any exertional pain.  May be reflux.  Takes medicine for reflux hypertension.  He is currently dealing with some homelessness.  He denies any abdominal pain nausea vomiting diarrhea.  No pain now.  No recent surgery travel.  No blood clot history.  No significant cardiac history in family.  Denies any weakness numbness tingling.  The history is provided by the patient.       Home Medications Prior to Admission medications   Medication Sig Start Date End Date Taking? Authorizing Provider  benzonatate (TESSALON) 100 MG capsule Take 1 capsule (100 mg total) by mouth every 8 (eight) hours. 10/31/18   Felicie Morn, NP  clotrimazole-betamethasone (LOTRISONE) cream Apply to affected area 2 times daily prn 11/13/22   Gilda Crease, MD  doxycycline (VIBRAMYCIN) 100 MG capsule Take 1 capsule (100 mg total) by mouth 2 (two) times daily. 07/31/18   Elson Areas, PA-C  hydrochlorothiazide (MICROZIDE) 12.5 MG capsule Take 1 capsule (12.5 mg total) by mouth daily. 07/23/16   Adonis Brook, NP  hydrOXYzine (ATARAX/VISTARIL) 25 MG tablet Take 1 tablet (25 mg total) by mouth every 6 (six) hours as needed for itching. 12/09/18   Long, Arlyss Repress, MD  ibuprofen (ADVIL,MOTRIN) 600 MG tablet Take 1 tablet (600 mg total) by mouth every 6 (six) hours as needed. 07/31/18   Elson Areas, PA-C  loratadine (CLARITIN) 10 MG tablet Take 1 tablet (10 mg total) by mouth daily. 01/21/22   Achille Rich, PA-C  naproxen (NAPROSYN) 500 MG tablet Take 1 tablet (500 mg total) by mouth 2 (two) times daily with a meal. 10/01/21   Achille Rich, PA-C       Allergies    Bee venom and Poison oak extract [poison oak extract]    Review of Systems   Review of Systems  Physical Exam Updated Vital Signs BP (!) 146/101   Pulse 82   Temp 98.1 F (36.7 C) (Oral)   Resp 18   Ht 5\' 6"  (1.676 m)   Wt 86.2 kg   SpO2 98%   BMI 30.67 kg/m  Physical Exam Vitals and nursing note reviewed.  Constitutional:      General: He is not in acute distress.    Appearance: He is well-developed. He is not ill-appearing.  HENT:     Head: Normocephalic and atraumatic.  Eyes:     Extraocular Movements: Extraocular movements intact.     Conjunctiva/sclera: Conjunctivae normal.     Pupils: Pupils are equal, round, and reactive to light.  Cardiovascular:     Rate and Rhythm: Normal rate and regular rhythm.     Pulses:          Radial pulses are 2+ on the right side and 2+ on the left side.     Heart sounds: Normal heart sounds. No murmur heard. Pulmonary:     Effort: Pulmonary effort is normal. No respiratory distress.     Breath sounds: Normal breath sounds.  Abdominal:     Palpations: Abdomen is soft.     Tenderness: There  is no abdominal tenderness.  Musculoskeletal:        General: No swelling.     Cervical back: Normal range of motion and neck supple.  Skin:    General: Skin is warm and dry.     Capillary Refill: Capillary refill takes less than 2 seconds.  Neurological:     Mental Status: He is alert.  Psychiatric:        Mood and Affect: Mood normal.     ED Results / Procedures / Treatments   Labs (all labs ordered are listed, but only abnormal results are displayed) Labs Reviewed  BASIC METABOLIC PANEL - Abnormal; Notable for the following components:      Result Value   Glucose, Bld 123 (*)    Calcium 8.6 (*)    All other components within normal limits  CBC - Abnormal; Notable for the following components:   RBC 3.72 (*)    Hemoglobin 11.8 (*)    HCT 36.1 (*)    All other components within normal limits  TROPONIN I (HIGH  SENSITIVITY)  TROPONIN I (HIGH SENSITIVITY)    EKG EKG Interpretation Date/Time:  Thursday July 13 2023 02:44:33 EST Ventricular Rate:  73 PR Interval:  146 QRS Duration:  84 QT Interval:  374 QTC Calculation: 412 R Axis:   47  Text Interpretation: Normal sinus rhythm Normal ECG When compared with ECG of 15-Jul-2016 16:02, PREVIOUS ECG IS PRESENT Confirmed by Virgina Norfolk [id number]) on 07/13/2023 2:59:37 PM  Radiology DG Chest 2 View Result Date: 07/13/2023 CLINICAL DATA:  Chest pain and some shortness of breath EXAM: CHEST - 2 VIEW COMPARISON:  02/07/2022 FINDINGS: The heart size and mediastinal contours are within normal limits. Both lungs are clear. The visualized skeletal structures are unremarkable. IMPRESSION: No active cardiopulmonary disease. Electronically Signed   By: Minerva Fester M.D.   On: 07/13/2023 03:24    Procedures Procedures    Medications Ordered in ED Medications - No data to display  ED Course/ Medical Decision Making/ A&P                                 Medical Decision Making  Philip Gonzalez is here with chest pain.  Heart score 1, Wells criteria 0.  Normal vitals except for mild hypertension.  No fever.  Noncardiac sounding chest pain today.  Differential diagnosis likely MSK related process GI related process but will evaluate for ACS pneumonia electrolyte abnormalities anemia.  He is currently dealing with homelessness.  Asking for something to eat and drink.  Labs obtained were CBC BMP troponin chest x-ray EKG.  EKG per my review and interpretation shows sinus rhythm.  No ischemic changes.  Troponin negative x 2.  No significant anemia electrolyte abnormality kidney injury or leukocytosis otherwise.  Chest x-ray with no evidence of pneumonia or pneumothorax.  Overall patient was able to eat and drink without any issues.  Is not having any pain.  Discharged in good condition.  Recommend follow-up with primary care.  Told to return if symptoms  worsen.  Resources given for homelessness.  This chart was dictated using voice recognition software.  Despite best efforts to proofread,  errors can occur which can change the documentation meaning.         Final Clinical Impression(s) / ED Diagnoses Final diagnoses:  Nonspecific chest pain    Rx / DC Orders ED Discharge Orders     None  Virgina Norfolk, DO 07/13/23 [phone]

## 2023-07-13 NOTE — ED Notes (Signed)
No answer x3 for vitals OTF

## 2023-07-14 ENCOUNTER — Telehealth: Payer: Self-pay | Admitting: *Deleted

## 2023-07-14 MED ORDER — NAPROXEN 500 MG PO TABS: 500.0000 mg | ORAL_TABLET | Freq: Two times a day (BID) | ORAL | 0 refills | Status: AC

## 2023-07-14 NOTE — Discharge Instructions (Signed)
You were evaluated in the Emergency Department and after careful evaluation, we did not find any emergent condition requiring admission or further testing in the hospital.  Your exam/testing today is overall reassuring.  Symptoms likely due to arthritis.  Use the Naprosyn anti-inflammatory as directed, follow-up with orthopedic specialist if not improving.  Please return to the Emergency Department if you experience any worsening of your condition.   Thank you for allowing [place] to be a part of your care.

## 2023-07-14 NOTE — ED Provider Notes (Signed)
 [facility] Emergency Department Provider Note MRN:  [id number]  Arrival date & time: 07/14/23     Chief Complaint   Knee Pain   History of Present Illness   Philip Gonzalez is a 58 y.o. year-old male with a history of hypertension presenting to the ED with chief complaint of knee pain.  Pain in bilateral knees, left greater than right, present for several years, worse over the past few days.  Denies trauma, no fever, no other complaints.  Review of Systems  A thorough review of systems was obtained and all systems are negative except as noted in the HPI and PMH.   Patient's Health History    Past Medical History:  Diagnosis Date   Acid reflux    Hemorrhoid    Hypertension    PTSD (post-traumatic stress disorder)    STD (male)     History reviewed. No pertinent surgical history.  Family History  Problem Relation Age of Onset   Cancer Mother    Depression Mother    Depression Maternal Aunt     Social History   Socioeconomic History   Marital status: Divorced    Spouse name: Not on file   Number of children: Not on file   Years of education: Not on file   Highest education level: Not on file  Occupational History   Not on file  Tobacco Use   Smoking status: Every Day    Current packs/day: 0.00    Types: Cigarettes    Last attempt to quit: 01/17/2002    Years since quitting: 21.5   Smokeless tobacco: Never   Tobacco comments:    3-4 cigarettes daily   Vaping Use   Vaping status: Never Used  Substance and Sexual Activity   Alcohol use: Yes    Comment: occ   Drug use: No   Sexual activity: Yes    Birth control/protection: Condom  Other Topics Concern   Not on file  Social History Narrative   Not on file   Social Drivers of Health   Financial Resource Strain: Low Risk  (07/12/2022)   Received from [facility], [facility]   Overall Financial Resource Strain (CARDIA)    Difficulty of Paying Living Expenses: Not hard at all   Food Insecurity: No Food Insecurity (07/12/2022)   Received from [facility], [facility]   Hunger Vital Sign    Worried About Running Out of Food in the Last Year: Never true    Ran Out of Food in the Last Year: Never true  Transportation Needs: No Transportation Needs (07/12/2022)   Received from [facility], [facility]   PRAPARE - Transportation    Lack of Transportation (Medical): No    Lack of Transportation (Non-Medical): No  Physical Activity: Not on file  Stress: Not on file  Social Connections: Unknown (05/03/2022)   Received from [facility], [facility]   Social Network    Social Network: Not on file  Intimate Partner Violence: Unknown (05/03/2022)   Received from [facility], [facility]   HITS    Physically Hurt: Not on file    Insult or Talk Down To: Not on file    Threaten Physical Harm: Not on file    Scream or Curse: Not on file     Physical Exam   Vitals:   07/13/23 2249 07/14/23 0248  BP: (!) 168/115 (!) 163/91  Pulse: (!) 101 92  Resp: 18 16  Temp: 97.7 F (36.5 C)  98.7 F (37.1 C)  SpO2: 97% 97%    CONSTITUTIONAL: Well-appearing, NAD NEURO/PSYCH:  Alert and oriented x 3, no focal deficits EYES:  eyes equal and reactive ENT/NECK:  no LAD, no JVD CARDIO: Regular rate, well-perfused, normal S1 and S2 PULM:  CTAB no wheezing or rhonchi GI/GU:  non-distended, non-tender MSK/SPINE:  No gross deformities, no edema SKIN:  no rash, atraumatic   *Additional and/or pertinent findings included in MDM below  Diagnostic and Interventional Summary    EKG Interpretation Date/Time:    Ventricular Rate:    PR Interval:    QRS Duration:    QT Interval:    QTC Calculation:   R Axis:      Text Interpretation:         Labs Reviewed - No data to display  No orders to display    Medications - No data to display   Procedures  /  Critical Care Procedures  ED Course and Medical Decision Making  Initial Impression and Ddx Acute on  chronic knee pain, normal range of motion, no increased warmth or erythema or edema, nothing to suggest infection, doubt PE.  Favoring OA.  Also suspecting secondary gain as part of the reason for checking in.  Past medical/surgical history that increases complexity of ED encounter: None  Interpretation of Diagnostics Laboratory and/or imaging options to aid in the diagnosis/care of the patient were considered.  After careful history and physical examination, it was determined that there was no indication for diagnostics at this time.  Patient Reassessment and Ultimate Disposition/Management     Discharge  Patient management required discussion with the following services or consulting groups:  None  Complexity of Problems Addressed Acute illness or injury that poses threat of life of bodily function  Additional Data Reviewed and Analyzed Further history obtained from: None  Additional Factors Impacting ED Encounter Risk Prescriptions  Elmer Sow. Pilar Plate, MD [facility] Emergency Medicine [facility] [email or web address]  Final Clinical Impressions(s) / ED Diagnoses     ICD-10-CM   1. Chronic pain of both knees  M25.561    M25.562    G89.29       ED Discharge Orders          Ordered    naproxen (NAPROSYN) 500 MG tablet  2 times daily        07/14/23 0544             Discharge Instructions Discussed with and Provided to Patient:    Discharge Instructions      You were evaluated in the Emergency Department and after careful evaluation, we did not find any emergent condition requiring admission or further testing in the hospital.  Your exam/testing today is overall reassuring.  Symptoms likely due to arthritis.  Use the Naprosyn anti-inflammatory as directed, follow-up with orthopedic specialist if not improving.  Please return to the Emergency Department if you experience any worsening of your condition.   Thank you for allowing [place] to be a part of  your care.      Sabas Sous, MD 07/14/23 [phone]

## 2023-07-14 NOTE — Telephone Encounter (Signed)
Pt called regarding MyChart set up to access his records.  Pt forgot password; RNCM reset password as requested.
